# Patient Record
Sex: Female | Born: 1970 | Race: Black or African American | Hispanic: No | Marital: Single | Smoking: Never smoker
Health system: Southern US, Community
[De-identification: ages and names within clinical notes are randomized; demographics above are authoritative.]

## PROBLEM LIST (undated history)

## (undated) DIAGNOSIS — F319 Bipolar disorder, unspecified: Secondary | ICD-10-CM

## (undated) DIAGNOSIS — R531 Weakness: Secondary | ICD-10-CM

## (undated) DIAGNOSIS — A6000 Herpesviral infection of urogenital system, unspecified: Secondary | ICD-10-CM

## (undated) DIAGNOSIS — F32A Depression, unspecified: Secondary | ICD-10-CM

## (undated) DIAGNOSIS — E669 Obesity, unspecified: Secondary | ICD-10-CM

## (undated) DIAGNOSIS — F329 Major depressive disorder, single episode, unspecified: Secondary | ICD-10-CM

## (undated) HISTORY — DX: Weakness: R53.1

## (undated) HISTORY — PX: ANKLE FRACTURE SURGERY: SHX122

---

## 2005-12-29 HISTORY — PX: GASTRIC BYPASS: SHX52

## 2015-06-18 ENCOUNTER — Emergency Department
Admission: EM | Admit: 2015-06-18 | Discharge: 2015-06-19 | Disposition: A | Discharge status: Home / Self Care | Payer: Self-pay | Attending: Emergency Medicine | Admitting: Emergency Medicine

## 2015-06-18 ENCOUNTER — Encounter: Payer: Self-pay | Admitting: *Deleted

## 2015-06-18 DIAGNOSIS — F329 Major depressive disorder, single episode, unspecified: Secondary | ICD-10-CM | POA: Insufficient documentation

## 2015-06-18 DIAGNOSIS — R410 Disorientation, unspecified: Secondary | ICD-10-CM

## 2015-06-18 DIAGNOSIS — F10921 Alcohol use, unspecified with intoxication delirium: Secondary | ICD-10-CM

## 2015-06-18 DIAGNOSIS — Z3202 Encounter for pregnancy test, result negative: Secondary | ICD-10-CM | POA: Insufficient documentation

## 2015-06-18 DIAGNOSIS — F10121 Alcohol abuse with intoxication delirium: Secondary | ICD-10-CM | POA: Insufficient documentation

## 2015-06-18 DIAGNOSIS — F1010 Alcohol abuse, uncomplicated: Secondary | ICD-10-CM

## 2015-06-18 LAB — URINE DRUG SCREEN, QUALITATIVE (ARMC ONLY)
Amphetamines, Ur Screen: NOT DETECTED
Barbiturates, Ur Screen: NOT DETECTED
Benzodiazepine, Ur Scrn: NOT DETECTED
COCAINE METABOLITE, UR ~~LOC~~: NOT DETECTED
Cannabinoid 50 Ng, Ur ~~LOC~~: NOT DETECTED
MDMA (ECSTASY) UR SCREEN: NOT DETECTED
Methadone Scn, Ur: NOT DETECTED
Opiate, Ur Screen: NOT DETECTED
Phencyclidine (PCP) Ur S: NOT DETECTED
TRICYCLIC, UR SCREEN: NOT DETECTED

## 2015-06-18 LAB — URINALYSIS COMPLETE WITH MICROSCOPIC (ARMC ONLY)
BILIRUBIN URINE: NEGATIVE
Glucose, UA: NEGATIVE mg/dL
LEUKOCYTES UA: NEGATIVE
Nitrite: NEGATIVE
PH: 5 (ref 5.0–8.0)
PROTEIN: 30 mg/dL — AB
Specific Gravity, Urine: 1.025 (ref 1.005–1.030)

## 2015-06-18 LAB — COMPREHENSIVE METABOLIC PANEL
ALT: 27 U/L (ref 14–54)
ANION GAP: 12 (ref 5–15)
AST: 47 U/L — ABNORMAL HIGH (ref 15–41)
Albumin: 4.4 g/dL (ref 3.5–5.0)
Alkaline Phosphatase: 121 U/L (ref 38–126)
BILIRUBIN TOTAL: 1.4 mg/dL — AB (ref 0.3–1.2)
BUN: 13 mg/dL (ref 6–20)
CALCIUM: 9.3 mg/dL (ref 8.9–10.3)
CHLORIDE: 108 mmol/L (ref 101–111)
CO2: 19 mmol/L — ABNORMAL LOW (ref 22–32)
CREATININE: 1.2 mg/dL — AB (ref 0.44–1.00)
GFR, EST NON AFRICAN AMERICAN: 54 mL/min — AB (ref >60)
Glucose, Bld: 145 mg/dL — ABNORMAL HIGH (ref 65–99)
Potassium: 3.8 mmol/L (ref 3.5–5.1)
Sodium: 139 mmol/L (ref 135–145)
Total Protein: 8.5 g/dL — ABNORMAL HIGH (ref 6.5–8.1)

## 2015-06-18 LAB — CBC WITH DIFFERENTIAL/PLATELET
BASOS ABS: 0 10*3/uL (ref 0–0.1)
BASOS PCT: 1 %
Eosinophils Absolute: 0 10*3/uL (ref 0–0.7)
Eosinophils Relative: 0 %
HCT: 32.8 % — ABNORMAL LOW (ref 35.0–47.0)
Hemoglobin: 10.4 g/dL — ABNORMAL LOW (ref 12.0–16.0)
Lymphocytes Relative: 10 %
Lymphs Abs: 0.7 10*3/uL — ABNORMAL LOW (ref 1.0–3.6)
MCH: 27.4 pg (ref 26.0–34.0)
MCHC: 31.7 g/dL — AB (ref 32.0–36.0)
MCV: 86.4 fL (ref 80.0–100.0)
MONO ABS: 0.6 10*3/uL (ref 0.2–0.9)
Monocytes Relative: 9 %
NEUTROS PCT: 80 %
Neutro Abs: 5.3 10*3/uL (ref 1.4–6.5)
PLATELETS: 361 10*3/uL (ref 150–440)
RBC: 3.8 MIL/uL (ref 3.80–5.20)
RDW: 22.4 % — ABNORMAL HIGH (ref 11.5–14.5)
WBC: 6.6 10*3/uL (ref 3.6–11.0)

## 2015-06-18 LAB — ETHANOL

## 2015-06-18 LAB — POCT PREGNANCY, URINE: Preg Test, Ur: NEGATIVE

## 2015-06-18 NOTE — ED Notes (Signed)

## 2015-06-18 NOTE — ED Notes (Signed)
ENVIRONMENTAL ASSESSMENT Potentially harmful objects out of patient reach: No. Personal belongings secured: Yes.   Patient dressed in hospital provided attire only: Yes.   Plastic bags out of patient reach: Yes.   Patient care equipment (cords, cables, call bells, lines, and drains) shortened, removed, or accounted for: Yes.   Equipment and supplies removed from bottom of stretcher: Yes.   Potentially toxic materials out of patient reach: Yes.   Sharps container removed or out of patient reach: Yes.   

## 2015-06-18 NOTE — ED Notes (Signed)
ED BHU PLACEMENT JUSTIFICATION Is the patient under IVC or is there intent for IVC: Yes.   Is the patient medically cleared: Yes.   Is there vacancy in the ED BHU: Yes.   Is the population mix appropriate for patient: Yes.   Is the patient awaiting placement in inpatient or outpatient setting: Yes.   Has the patient had a psychiatric consult: No. Survey of unit performed for contraband, proper placement and condition of furniture, tampering with fixtures in bathroom, shower, and each patient room: Yes.  ; Findings:  APPEARANCE/BEHAVIOR calm, cooperative and adequate rapport can be established NEURO ASSESSMENT Orientation: time, place and person Hallucinations: No.None noted (Hallucinations) Speech: Normal Gait: normal RESPIRATORY ASSESSMENT Normal expansion.  Clear to auscultation.  No rales, rhonchi, or wheezing. CARDIOVASCULAR ASSESSMENT regular rate and rhythm, S1, S2 normal, no murmur, click, rub or gallop GASTROINTESTINAL ASSESSMENT soft, nontender, BS WNL, no r/g EXTREMITIES normal strength, tone, and muscle mass PLAN OF CARE Provide calm/safe environment. Vital signs assessed twice daily. ED BHU Assessment once each 12-hour shift. Collaborate with intake RN daily or as condition indicates. Assure the ED provider has rounded once each shift. Provide and encourage hygiene. Provide redirection as needed. Assess for escalating behavior; address immediately and inform ED provider.  Assess family dynamic and appropriateness for visitation as needed: Yes.  ; If necessary, describe findings:  Educate the patient/family about BHU procedures/visitation: Yes.  ; If necessary, describe findings:  

## 2015-06-18 NOTE — ED Notes (Signed)
Patient assigned to appropriate care area. Patient oriented to unit/care area: Informed that, for their safety, care areas are designed for safety and monitored by security cameras at all times; and visiting hours explained to patient. Patient verbalizes understanding, and verbal contract for safety obtained. 

## 2015-06-18 NOTE — ED Notes (Signed)

## 2015-06-18 NOTE — ED Notes (Signed)
Patient calm and cooperative. NAD noted at this time. Patient denies any complaints at this time. Patient verbalized understanding to contract for safety. Will continue to monitor.

## 2015-06-18 NOTE — ED Notes (Signed)
Patient sitting comfortably in chair, denies pain or other complaints at this time. Patient calm and cooperative, NAD noted. Patient verbalized contract for safety. Will continue to monitor.

## 2015-06-18 NOTE — BH Assessment (Signed)
Assessment Note  Debbie Golden is an 44 y.o. female. She reports to the ED after being picked up by EMS.  She states that she was walking to church with a cross on her back.  She states that she was dehydrated and someone must have seen her and contacted the police.  She states that she is not depressed or anxious.  She denied auditory or visual hallucinations.  She denied having homicidal or suicidal ideation or intent.  She denied using alcohol or drugs. IVC papers  reports that Debbie Golden  Was walking down the street carrying a homemade cross. It is reported that she parked her car and appeared to  Have just walked away. At this time her car is in a unknown area.  At the time she reported that she did not know where she was, where she was going, or where she has left her car before the episode.  She is reported by "Alinda Money", her boyfriend reports that Debbie Golden has been having trouble sleeping and doing unusual things. In addition he believed the stress of her new position may be impacting her.   Axis I: Bipolar, Manic Axis II: Deferred Axis III: History reviewed. No pertinent past medical history. Axis IV: other psychosocial or environmental problems and problems with primary support group Axis V: 31-40 impairment in reality testing  Past Medical History: History reviewed. No pertinent past medical history.  History reviewed. No pertinent past surgical history.  Family History: No family history on file.  Social History:  reports that she has never smoked. She does not have any smokeless tobacco history on file. She reports that she does not drink alcohol. Her drug history is not on file.  Additional Social History:  Alcohol / Drug Use History of alcohol / drug use?: No history of alcohol / drug abuse  CIWA: CIWA-Ar BP: (!) 143/92 mmHg Pulse Rate: (!) 101 COWS:    Allergies: No Known Allergies  Home Medications:  (Not in a hospital admission)  OB/GYN Status:  Patient's last menstrual period  was 06/11/2015.  General Assessment Data Location of Assessment: [facility] ED TTS Assessment: In system Is this a Tele or Face-to-Face Assessment?: Face-to-Face Is this an Initial Assessment or a Re-assessment for this encounter?: Initial Assessment Marital status: Single Maiden name: Mullens Is patient pregnant?: No Pregnancy Status: No Living Arrangements: Non-relatives/Friends (Boyfriend) Can pt return to current living arrangement?: Yes Admission Status: Involuntary Is patient capable of signing voluntary admission?: Yes Referral Source: MD Insurance type: Probation officer [employer] Screening Exam [facility] Walk-in ONLY) Medical Exam completed: Yes  Crisis Care Plan Living Arrangements: Non-relatives/Friends (Boyfriend) Name of Psychiatrist: None Name of Therapist: None  Education Status Is patient currently in school?: No Current Grade: n/a Highest grade of school patient has completed: Master's Degree Name of school: n/a Contact person: n/a  Risk to self with the past 6 months Suicidal Ideation: No Has patient been a risk to self within the past 6 months prior to admission? : No Suicidal Intent: No Has patient had any suicidal intent within the past 6 months prior to admission? : No Is patient at risk for suicide?: No Suicidal Plan?: No Has patient had any suicidal plan within the past 6 months prior to admission? : No Access to Means: No What has been your use of drugs/alcohol within the last 12 months?: None reported Previous Attempts/Gestures: No How many times?: 0 Other Self Harm Risks: None reported Triggers for Past Attempts: None known Intentional  Self Injurious Behavior: None Family Suicide History: No Recent stressful life event(s):  (None reported) Persecutory voices/beliefs?: No Depression: No Depression Symptoms:  (None ) Substance abuse history and/or treatment for substance abuse?: No Suicide prevention information given to non-admitted patients: Not  applicable  Risk to Others within the past 6 months Homicidal Ideation: No Does patient have any lifetime risk of violence toward others beyond the six months prior to admission? : No Thoughts of Harm to Others: No Current Homicidal Intent: No Current Homicidal Plan: No Access to Homicidal Means: No Identified Victim: None reported History of harm to others?: No Assessment of Violence: None Noted Violent Behavior Description: denied Does patient have access to weapons?: No Criminal Charges Pending?: No Does patient have a court date: No Is patient on probation?: No  Psychosis Hallucinations: None noted Delusions: None noted  Mental Status Report Appearance/Hygiene: In scrubs, Unremarkable Eye Contact: Good Motor Activity: Unremarkable Speech: Unremarkable Level of Consciousness: Alert Mood: Apprehensive Affect: Blunted Anxiety Level: None Thought Processes: Unable to Assess Judgement: Unable to Assess Orientation: Place, Time, Situation Obsessive Compulsive Thoughts/Behaviors: None  Cognitive Functioning Appetite: Good Sleep: Decreased (waking up at night)  ADLScreening [facility] Assessment Services) Patient's cognitive ability adequate to safely complete daily activities?: Yes Patient able to express need for assistance with ADLs?: Yes Independently performs ADLs?: Yes (appropriate for developmental age)  Prior Inpatient Therapy Prior Inpatient Therapy: No Prior Therapy Dates: n/a Prior Therapy Facilty/Provider(s): n/a Reason for Treatment: n/a  Prior Outpatient Therapy Prior Outpatient Therapy: Yes Prior Therapy Dates:  (Unsure- prior to relocation 6 months ago) Does patient have an ACCT team?: No Does patient have Intensive In-House Services?  : No Does patient have [facility] services? : No Does patient have P4CC services?: No  ADL Screening (condition at time of admission) Patient's cognitive ability adequate to safely complete daily activities?: Yes Patient  able to express need for assistance with ADLs?: Yes Independently performs ADLs?: Yes (appropriate for developmental age)       Abuse/Neglect Assessment (Assessment to be complete while patient is alone) Physical Abuse: Denies Verbal Abuse: Denies Sexual Abuse: Denies Exploitation of patient/patient's resources: Denies Self-Neglect: Denies Values / Beliefs Cultural Requests During Hospitalization: None Spiritual Requests During Hospitalization: None   Advance Directives (For Healthcare) Does patient have an advance directive?: No Would patient like information on creating an advanced directive?: Yes - Educational materials given    Additional Information 1:1 In Past 12 Months?: No CIRT Risk: No Elopement Risk: No Does patient have medical clearance?: No     Disposition:  Disposition Initial Assessment Completed for this Encounter: Yes Disposition of Patient:  (To be seen by the psychiatrist)  On Site Evaluation by:   Reviewed with Physician:    Theadora Rama 06/18/2015 11:19 PM

## 2015-06-18 NOTE — ED Notes (Signed)
Spoke with Georgann Housekeeper ([place]), with patient permission. Ms. Lanae Boast states patient was crawling on floor on Saturday and when asked why patient was crawling, patient replied "the dog was trying to tell me something." Per Ms. Lanae Boast patient was not acting normal all day Saturday. Ms. Lanae Boast reports patient went to see Ms. William Hamburger mother earlier today and had asked her how to get back to her boyfriend's house (this is where patient lives). They state they have been looking for her today unable to find her.

## 2015-06-18 NOTE — ED Notes (Signed)
Spoke with Debbie Golden, states vehicle was found by them at a garage. Patient made aware.

## 2015-06-18 NOTE — ED Notes (Signed)
Was walking on a street today carrying a cross, deneis SI,is having guilty thoughts about something

## 2015-06-18 NOTE — ED Notes (Signed)
MD at bedside. 

## 2015-06-18 NOTE — ED Notes (Signed)

## 2015-06-18 NOTE — ED Provider Notes (Addendum)
[facility] Emergency Department Provider Note     Time seen: ----------------------------------------- 6:51 PM on 06/18/2015 -----------------------------------------    I have reviewed the triage vital signs and the nursing notes.   HISTORY  Chief Complaint Manic Behavior    HPI Debbie Golden is a 44 y.o. female who presents ER having guilty thoughts, found walking on the street today caring a cross.Patient does not remember how long she's been walking, the reason that she didn't walking, or where she was walking too. Patient states she lives in [place] and spent the night at her aunt Sapphire's house. But again she does not room number time she developed after where she was going. States she's been doing a lot stress and having guilty thoughts. She states she made a cross out of sticks that she found along the way. She states she feels fine now just little dehydrated from walking in the sun. She denies suicidal or homicidal ideations   History reviewed. No pertinent past medical history.  There are no active problems to display for this patient.   History reviewed. No pertinent past surgical history.  Allergies Review of patient's allergies indicates no known allergies.  Social History History  Substance Use Topics  . Smoking status: Never Smoker   . Smokeless tobacco: Not on file  . Alcohol Use: No    Review of Systems Constitutional: Negative for fever. Eyes: Negative for visual changes. ENT: Negative for sore throat. Cardiovascular: Negative for chest pain. Respiratory: Negative for shortness of breath. Gastrointestinal: Negative for abdominal pain, vomiting and diarrhea. Genitourinary: Negative for dysuria. Musculoskeletal: Negative for back pain. Skin: Negative for rash. Neurological: Negative for headaches, focal weakness or numbness. Psychiatric: Patient does describe guilty and depressive feelings 10-point ROS otherwise  negative.  ____________________________________________   PHYSICAL EXAM:  VITAL SIGNS: ED Triage Vitals  Enc Vitals Group     BP 06/18/15 1828 145/93 mmHg     Pulse Rate 06/18/15 1828 105     Resp 06/18/15 1828 20     Temp 06/18/15 1828 98.9 F (37.2 C)     Temp Source 06/18/15 1828 Oral     SpO2 06/18/15 1828 99 %     Weight 06/18/15 1828 185 lb (83.915 kg)     Height 06/18/15 1828  (1.702 m)     Head Cir --      Peak Flow --      Pain Score --      Pain Loc --      Pain Edu? --      Excl. in GC? --    Mood and behavior are abnormal, Constitutional: Alert and oriented. Well appearing and in no distress. Eyes: Conjunctivae are normal. PERRL. Normal extraocular movements. ENT   Head: Normocephalic and atraumatic.   Nose: No congestion/rhinnorhea.   Mouth/Throat: Mucous membranes are moist.   Neck: No stridor. Hematological/Lymphatic/Immunilogical: No cervical lymphadenopathy. Cardiovascular: Normal rate, regular rhythm. Normal and symmetric distal pulses are present in all extremities. No murmurs, rubs, or gallops. Respiratory: Normal respiratory effort without tachypnea nor retractions. Breath sounds are clear and equal bilaterally. No wheezes/rales/rhonchi. Gastrointestinal: Soft and nontender. No distention. No abdominal bruits. There is no CVA tenderness. Musculoskeletal: Nontender with normal range of motion in all extremities. No joint effusions.  No lower extremity tenderness nor edema. Neurologic:  Normal speech and language. No gross focal neurologic deficits are appreciated. Speech is normal. No gait instability. Skin:  Skin is warm, dry and intact. No rash noted. Psychiatric:  Mood and behavior are abnormal, patient has very poor insight and is very vague with all of her answers. ____________________________________________  ED COURSE:  Pertinent labs & imaging results that were available during my care of the patient were reviewed by me and  considered in my medical decision making (see chart for details). Patient appears to be somewhat psychotic, will need psychiatric evaluation ____________________________________________    LABS (pertinent positives/negatives)  Labs Reviewed  CBC WITH DIFFERENTIAL/PLATELET - Abnormal; Notable for the following:    Hemoglobin 10.4 (*)    HCT 32.8 (*)    MCHC 31.7 (*)    RDW 22.4 (*)    Lymphs Abs 0.7 (*)    All other components within normal limits  COMPREHENSIVE METABOLIC PANEL - Abnormal; Notable for the following:    CO2 19 (*)    Glucose, Bld 145 (*)    Creatinine, Ser 1.20 (*)    Total Protein 8.5 (*)    AST 47 (*)    Total Bilirubin 1.4 (*)    GFR calc non Af Amer 54 (*)    All other components within normal limits  URINALYSIS COMPLETEWITH MICROSCOPIC ([facility] ONLY) - Abnormal; Notable for the following:    Color, Urine YELLOW (*)    APPearance HAZY (*)    Ketones, ur 2+ (*)    Hgb urine dipstick 2+ (*)    Protein, ur 30 (*)    Bacteria, UA RARE (*)    Squamous Epithelial / LPF 0-5 (*)    All other components within normal limits  ETHANOL  URINE DRUG SCREEN, QUALITATIVE ([facility] ONLY)  POC URINE PREG, ED  POCT PREGNANCY, URINE    RADIOLOGY  None  ____________________________________________  FINAL ASSESSMENT AND PLAN  Acute psychosis  Plan: Patient will need to be involuntarily committed until we can ascertain the reasons for her behavior. At this point I'm uncomfortable with her being discharged. We'll consult psychiatric services for evaluation   Emily Filbert, MD   Emily Filbert, MD 06/18/15 1857  Emily Filbert, MD 06/18/15 1942  Emily Filbert, MD 06/18/15 2242

## 2015-06-18 NOTE — ED Notes (Signed)
Debbie Golden requesting information on patient's car, patient unable to recall where she left her vehicle. Attempting to call [facility] to find patient car. Patient unsure where vehicle keys are. Informed Ms. Debbie Golden we attempting to find vehicle at this time.

## 2015-06-19 DIAGNOSIS — F329 Major depressive disorder, single episode, unspecified: Secondary | ICD-10-CM

## 2015-06-19 DIAGNOSIS — F1010 Alcohol abuse, uncomplicated: Secondary | ICD-10-CM

## 2015-06-19 DIAGNOSIS — R410 Disorientation, unspecified: Secondary | ICD-10-CM

## 2015-06-19 MED ORDER — ZIPRASIDONE MESYLATE 20 MG IM SOLR
20.0000 mg | Freq: Once | INTRAMUSCULAR | Status: AC
  Administered 2015-06-19: 20 mg via INTRAMUSCULAR

## 2015-06-19 MED ORDER — ZIPRASIDONE MESYLATE 20 MG IM SOLR
INTRAMUSCULAR | Status: AC
  Administered 2015-06-19: 20 mg via INTRAMUSCULAR
  Filled 2015-06-19: qty 20

## 2015-06-19 NOTE — ED Notes (Signed)
Lunch provided along with an extra drink   Appropriate to stimulation  No verbalized needs or concerns at this time  NAD assessed  Continue to monitor 

## 2015-06-19 NOTE — ED Notes (Signed)
BEHAVIORAL HEALTH ROUNDING Patient sleeping: Yes.   Patient alert and oriented: eyes closed  Appears asleep Behavior appropriate: Yes.  ; If no, describe:  Nutrition and fluids offered: Yes  Toileting and hygiene offered: sleeping Sitter present: q 15 minute observations and security camera monitoring Law enforcement present: yes  ODS 

## 2015-06-19 NOTE — ED Notes (Signed)
Consult completed - pt has had a visitor in the waiting room since approx 1300  Pt will be discharged to home pending IVC papers being rescinded

## 2015-06-19 NOTE — ED Notes (Signed)
Breakfast provided   Patient observed lying in bed with eyes closed  Even, unlabored respirations observed   NAD pt appears to be sleeping  I will continue to monitor along with every 15 minute visual observations and ongoing security camera monitoring    

## 2015-06-19 NOTE — ED Notes (Signed)
BEHAVIORAL HEALTH ROUNDING Patient sleeping: No. Patient alert and oriented: yes Behavior appropriate: No.; If no, describe: pt. Restless in room. Nutrition and fluids offered: Yes  Toileting and hygiene offered: Yes  Sitter present: no Law enforcement present: Yes

## 2015-06-19 NOTE — ED Notes (Signed)

## 2015-06-19 NOTE — ED Notes (Signed)
Pt. Came out of her room asking to go home.  Told pt. She was IVD

## 2015-06-19 NOTE — ED Notes (Signed)
Pt has been awake and ambulated to the BR   Pt then went to room 6 and sat in the chair for awhile  - observed pt go back to the BR and then to her bed and she laid down

## 2015-06-19 NOTE — ED Notes (Signed)
Pt to be discharged to home  1/1 bags of belongings returned to her and she verbalized that she received back everything that she came here with  Discharge instructions reviewed with her and she verbalized agreement and understanding

## 2015-06-19 NOTE — ED Notes (Signed)
Pt. Up again and trying to get into other pt. Rooms.  Talked to pt. And told pt. We are going to move her to another room. Pt. Ok with move.  Pt. Moved into 6, 7, and 8 section.

## 2015-06-19 NOTE — ED Notes (Signed)
BEHAVIORAL HEALTH ROUNDING Patient sleeping: No. Patient alert and oriented: yes Behavior appropriate: Yes.  ; If no, describe:  Nutrition and fluids offered: yes Toileting and hygiene offered: Yes  Sitter present: q15 minute observations and security camera monitoring Law enforcement present: Yes  ODS  

## 2015-06-19 NOTE — ED Notes (Signed)
Patient observed lying in bed with eyes closed  Even, unlabored respirations observed   NAD pt appears to be sleeping  I will continue to monitor along with every 15 minute visual observations and ongoing security camera monitoring    

## 2015-06-19 NOTE — ED Provider Notes (Signed)
-----------------------------------------   3:14 PM on 06/19/2015 -----------------------------------------   BP 136/74 mmHg  Pulse 112  Temp(Src) 98.3 F (36.8 C) (Oral)  Resp 20  Ht 5\' 7"  (1.702 m)  Wt 185 lb (83.915 kg)  BMI 28.97 kg/m2  SpO2 100%  LMP 06/11/2015  The patient had no acute events since last update.  Calm and cooperative at this time.  Seen by psychiatry and cleared for discharge. Patient to return to AA and her counselor in [place]. Clinically sober at this time without any signs of psychosis or delirium.    Myrna Blazer, MD 06/19/15 1515

## 2015-06-19 NOTE — ED Notes (Signed)
Pt. Out of room into hallway sitting on chair.

## 2015-06-19 NOTE — ED Notes (Signed)
Pt. Up and trying to get into other pt. Rooms.  Pt. Stated she was confused.  Pt. Was asked if she needed anything.  Pt. Stated "I don't need anything".  Pt. Redirected back to room.

## 2015-06-19 NOTE — ED Notes (Signed)
Pt with a phone call from Alinda Money [phone]

## 2015-06-19 NOTE — ED Notes (Signed)
BEHAVIORAL HEALTH ROUNDING  Patient sleeping: Yes.  Patient alert and oriented: no  Behavior appropriate: Yes. ; If no, describe:  Nutrition and fluids offered: No  Toileting and hygiene offered: No  Sitter present: no  Law enforcement present: Yes   

## 2015-06-19 NOTE — Consult Note (Signed)
[facility] Face-to-Face Psychiatry Consult   Reason for Consult:  Consult for this 44 year old woman who was picked up by law enforcement and appeared to be very confused yesterday. Referring Physician:  Joni Fears Patient Identification: Debbie Golden MRN:  [id number] Principal Diagnosis: Delirium, acute Diagnosis:   Patient Active Problem List   Diagnosis Date Noted  . Delirium, acute [R41.0] 06/19/2015  . Major depression [F32.2] 06/19/2015  . Alcohol abuse [F10.10] 06/19/2015    Total Time spent with patient: 1 hour  Subjective:   Debbie Golden is a 45 y.o. female patient admitted with "I've had a lot of stuff going on for a long time". The patient reports being aware that she was having a nervous breakdown but is now starting to feel better.Marland Kitchen  HPI:  Information from the patient and the chart. Labs reviewed. Commitment paperwork reviewed. Patient reports that she has been under a lot of stress recently. The biggest problem has been her job. She recently got a promotion and has been working long hours. Working weekends. She has not been sleeping well and she knows it. She has not been eating well and she knows it. She's been back to drinking and has escalated the amount up to 2 or 3 bottles of wine a day. She says that her fianc was trying to help her with her stress and had introduced her to a religious-based program. She took a little too far and was out walking in the hot sun yesterday carrying across. She admits that that was not something that they had recommended to her but something she thought up on her own. She knows that she was confused because she had not been eating or sleeping. Last night she finally got a decent night sleep and has eaten food and had appropriate fluid today. She says she is feeling better. She has been feeling anxious and dysphoric. Sleep pattern was poor. Denies that she had any suicidal thoughts. She says that when she was not eating and when she was drinking she had been  having occasional auditory hallucinations but they're not happening today. She had been off of her psychiatric medicine which was sertraline 100 mg a day.  Past psychiatric history: Patient says she had a suicide attempt 10 years ago. Was in the observation unit at [facility] for several days. No suicide attempts outside of that. She identifies drinking as being a major problem for her. [facility] and then went to an [place]. She tried to stay sober for a while but got back into drinking late last fall. No history of delirium tremens or seizures. Denies that she uses other drugs. Denies any history of being diagnosed with a psychotic disorder.  Social history: Patient is unmarried but has a fianc. She has her own apartment but she and the fianc stayed together most of the time. She has no children. She believes that she lost a miscarriage last week although it sounds like her memory of it is a little vague. She says she is estranged from most of her family and she is adopted. 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  Medical history: Denies any significant ongoing medical problems no history of high blood pressure or diabetes. Was on sertraline 100 mg a day not taking currently. No other prescription medicine.  Family history: Unknown because she is adopted HPI Elements:   Quality:  Confusion and agitation. Severity:  Moderate but potentially  life-threatening from heatstroke. Timing:  Escalating and happen mostly yesterday. Duration:  Seems to been transient for a few hours. Context:  Alcohol abuse dehydration poor nutrition high emotional stress.  Past Medical History: History reviewed. No pertinent past medical history. History reviewed. No pertinent past surgical history. Family History: No family history on file. Social History:  History  Alcohol Use No     History  Drug Use Not on file    History    Social History  . Marital Status: Single    Spouse Name: N/A  . Number of Children: N/A  . Years of Education: N/A   Social History Main Topics  . Smoking status: Never Smoker   . Smokeless tobacco: Not on file  . Alcohol Use: No  . Drug Use: Not on file  . Sexual Activity: Not on file   Other Topics Concern  . None   Social History Narrative  . None   Additional Social History:    History of alcohol / drug use?: No history of alcohol / drug abuse                     Allergies:  No Known Allergies  Labs:  Results for orders placed or performed during the hospital encounter of 06/18/15 (from the past 48 hour(s))  CBC WITH DIFFERENTIAL     Status: Abnormal   Collection Time: 06/18/15  6:23 PM  Result Value Ref Range   WBC 6.6 3.6 - 11.0 K/uL   RBC 3.80 3.80 - 5.20 MIL/uL   Hemoglobin 10.4 (L) 12.0 - 16.0 g/dL   HCT 32.8 (L) 35.0 - 47.0 %   MCV 86.4 80.0 - 100.0 fL   MCH 27.4 26.0 - 34.0 pg   MCHC 31.7 (L) 32.0 - 36.0 g/dL   RDW 22.4 (H) 11.5 - 14.5 %   Platelets 361 150 - 440 K/uL   Neutrophils Relative % 80 %   Neutro Abs 5.3 1.4 - 6.5 K/uL   Lymphocytes Relative 10 %   Lymphs Abs 0.7 (L) 1.0 - 3.6 K/uL   Monocytes Relative 9 %   Monocytes Absolute 0.6 0.2 - 0.9 K/uL   Eosinophils Relative 0 %   Eosinophils Absolute 0.0 0 - 0.7 K/uL   Basophils Relative 1 %   Basophils Absolute 0.0 0 - 0.1 K/uL  Comprehensive metabolic panel     Status: Abnormal   Collection Time: 06/18/15  6:23 PM  Result Value Ref Range   Sodium 139 135 - 145 mmol/L   Potassium 3.8 3.5 - 5.1 mmol/L   Chloride 108 101 - 111 mmol/L   CO2 19 (L) 22 - 32 mmol/L   Glucose, Bld 145 (H) 65 - 99 mg/dL   BUN 13 6 - 20 mg/dL   Creatinine, Ser 1.20 (H) 0.44 - 1.00 mg/dL   Calcium 9.3 8.9 - 10.3 mg/dL   Total Protein 8.5 (H) 6.5 - 8.1 g/dL   Albumin 4.4 3.5 - 5.0 g/dL   AST 47 (H) 15 - 41 U/L   ALT 27 14 - 54 U/L   Alkaline Phosphatase 121 38 - 126 U/L   Total Bilirubin 1.4 (H) 0.3 -  1.2 mg/dL   GFR calc non Af Amer 54 (L) >60 mL/min   GFR calc Af Amer >60 >60 mL/min    Comment: (NOTE) The eGFR has been calculated using the CKD EPI equation. This calculation has not been validated in all clinical situations. eGFR's persistently <60 mL/min signify  possible Chronic Kidney Disease.    Anion gap 12 5 - 15  Ethanol     Status: None   Collection Time: 06/18/15  6:23 PM  Result Value Ref Range   Alcohol, Ethyl (B) <5 <5 mg/dL    Comment:        LOWEST DETECTABLE LIMIT FOR SERUM ALCOHOL IS 5 mg/dL FOR MEDICAL PURPOSES ONLY   Urine Drug Screen, Qualitative ([facility] only)     Status: None   Collection Time: 06/18/15  6:23 PM  Result Value Ref Range   Tricyclic, Ur Screen NONE DETECTED NONE DETECTED   Amphetamines, Ur Screen NONE DETECTED NONE DETECTED   MDMA (Ecstasy)Ur Screen NONE DETECTED NONE DETECTED   Cocaine Metabolite,Ur [place] NONE DETECTED NONE DETECTED   Opiate, Ur Screen NONE DETECTED NONE DETECTED   Phencyclidine (PCP) Ur S NONE DETECTED NONE DETECTED   Cannabinoid 50 Ng, Ur  NONE DETECTED NONE DETECTED   Barbiturates, Ur Screen NONE DETECTED NONE DETECTED   Benzodiazepine, Ur Scrn NONE DETECTED NONE DETECTED   Methadone Scn, Ur NONE DETECTED NONE DETECTED    Comment: (NOTE) 675  Tricyclics, urine               Cutoff 1000 ng/mL 200  Amphetamines, urine             Cutoff 1000 ng/mL 300  MDMA (Ecstasy), urine           Cutoff 500 ng/mL 400  Cocaine Metabolite, urine       Cutoff 300 ng/mL 500  Opiate, urine                   Cutoff 300 ng/mL 600  Phencyclidine (PCP), urine      Cutoff 25 ng/mL 700  Cannabinoid, urine              Cutoff 50 ng/mL 800  Barbiturates, urine             Cutoff 200 ng/mL 900  Benzodiazepine, urine           Cutoff 200 ng/mL 1000 Methadone, urine                Cutoff 300 ng/mL 1100 1200 The urine drug screen provides only a preliminary, unconfirmed 1300 analytical test result and should not be used for non-medical 1400  purposes. Clinical consideration and professional judgment should 1500 be applied to any positive drug screen result due to possible 1600 interfering substances. A more specific alternate chemical method 1700 must be used in order to obtain a confirmed analytical result.  1800 Gas chromato graphy / mass spectrometry (GC/MS) is the preferred 1900 confirmatory method.   Urinalysis complete, with microscopic ([facility] only)     Status: Abnormal   Collection Time: 06/18/15  6:23 PM  Result Value Ref Range   Color, Urine YELLOW (A) YELLOW   APPearance HAZY (A) CLEAR   Glucose, UA NEGATIVE NEGATIVE mg/dL   Bilirubin Urine NEGATIVE NEGATIVE   Ketones, ur 2+ (A) NEGATIVE mg/dL   Specific Gravity, Urine 1.025 1.005 - 1.030   Hgb urine dipstick 2+ (A) NEGATIVE   pH 5.0 5.0 - 8.0   Protein, ur 30 (A) NEGATIVE mg/dL   Nitrite NEGATIVE NEGATIVE   Leukocytes, UA NEGATIVE NEGATIVE   RBC / HPF 0-5 0 - 5 RBC/hpf   WBC, UA 0-5 0 - 5 WBC/hpf   Bacteria, UA RARE (A) NONE SEEN   Squamous Epithelial / LPF 0-5 (A) NONE SEEN  Mucous PRESENT    Hyaline Casts, UA PRESENT   Pregnancy, urine POC     Status: None   Collection Time: 06/18/15  6:38 PM  Result Value Ref Range   Preg Test, Ur NEGATIVE NEGATIVE    Comment:        THE SENSITIVITY OF THIS METHODOLOGY IS >24 mIU/mL     Vitals: Blood pressure 136/74, pulse 112, temperature 98.3 F (36.8 C), temperature source Oral, resp. rate 20, height 5' 7"  (1.702 m), weight 83.915 kg (185 lb), last menstrual period 06/11/2015, SpO2 100 %.  Risk to Self: Suicidal Ideation: No Suicidal Intent: No Is patient at risk for suicide?: No Suicidal Plan?: No Access to Means: No What has been your use of drugs/alcohol within the last 12 months?: None reported How many times?: 0 Other Self Harm Risks: None reported Triggers for Past Attempts: None known Intentional Self Injurious Behavior: None Risk to Others: Homicidal Ideation: No Thoughts of Harm to Others:  No Current Homicidal Intent: No Current Homicidal Plan: No Access to Homicidal Means: No Identified Victim: None reported History of harm to others?: No Assessment of Violence: None Noted Violent Behavior Description: denied Does patient have access to weapons?: No Criminal Charges Pending?: No Does patient have a court date: No Prior Inpatient Therapy: Prior Inpatient Therapy: No Prior Therapy Dates: n/a Prior Therapy Facilty/Provider(s): n/a Reason for Treatment: n/a Prior Outpatient Therapy: Prior Outpatient Therapy: Yes Prior Therapy Dates:  (Unsure- prior to relocation 6 months ago) Does patient have an ACCT team?: No Does patient have Intensive In-House Services?  : No Does patient have [facility] services? : No Does patient have [facility] services?: No  No current facility-administered medications for this encounter.   Current Outpatient Prescriptions  Medication Sig Dispense Refill  . sertraline (ZOLOFT) 100 MG tablet Take 100 mg by mouth daily.      Musculoskeletal: Strength & Muscle Tone: within normal limits Gait & Station: normal Patient leans: N/A  Psychiatric Specialty Exam: Physical Exam  Constitutional: She appears well-developed and well-nourished.  HENT:  Head: Normocephalic and atraumatic.  Eyes: Conjunctivae are normal. Pupils are equal, round, and reactive to light.  Neck: Normal range of motion.  Cardiovascular: Normal heart sounds.   Respiratory: Effort normal.  GI: Soft.  Musculoskeletal: Normal range of motion.  Neurological: She is alert.  Skin: Skin is warm and dry.  Psychiatric: She has a normal mood and affect. Her speech is normal and behavior is normal. Judgment and thought content normal. Cognition and memory are normal.  Today the patient appears to have returned pretty much to her baseline. She is alert and oriented. Good insight. No suicidality no homicidality. No acute psychosis. Affect euthymic    Review of Systems  Constitutional:  Negative.   HENT: Negative.   Eyes: Negative.   Respiratory: Negative.   Cardiovascular: Negative.   Gastrointestinal: Negative.   Musculoskeletal: Negative.   Skin: Negative.   Neurological: Negative.   Psychiatric/Behavioral: The patient is nervous/anxious and has insomnia.     Blood pressure 136/74, pulse 112, temperature 98.3 F (36.8 C), temperature source Oral, resp. rate 20, height 5' 7"  (1.702 m), weight 83.915 kg (185 lb), last menstrual period 06/11/2015, SpO2 100 %.Body mass index is 28.97 kg/(m^2).  General Appearance: Casual  Eye Contact::  Good  Speech:  Slow  Volume:  Normal  Mood:  Euthymic  Affect:  Blunt  Thought Process:  Coherent  Orientation:  Full (Time, Place, and Person)  Thought Content:  Negative  Suicidal Thoughts:  No  Homicidal Thoughts:  No  Memory:  Immediate;   Good Recent;   Good Remote;   Fair  Judgement:  Fair  Insight:  Good  Psychomotor Activity:  Decreased  Concentration:  Fair  Recall:  [employer] of Knowledge:Fair  Language: Fair  Akathisia:  No  Handed:  Right  AIMS (if indicated):     Assets:  Communication Skills Desire for Improvement Physical Health Social Support Talents/Skills Vocational/Educational  ADL's:  Intact  Cognition: WNL  Sleep:      Medical Decision Making: Review of Psycho-Social Stressors (1), Review or order clinical lab tests (1), New Problem, with no additional work-up planned (3) and Review of Medication Regimen & Side Effects (2)  Treatment Plan Summary: Plan Patient was confused and probably delirious yesterday. She has recovered and at this point is not delirious or psychotic. Denies suicidality. No evidence of acute dangerousness. She has good insight and agrees to an appropriate treatment plan. Patient does not meet commitment criteria nor require inpatient hospital level treatment. Does not appear to be having dangerous symptoms of alcohol withdrawal.Patient has been counseled about the importance  of trying to get her life back on track and is very much in agreement to it. She agrees that she needs to start sleeping regularly, get rid of the alcohol, get back into treatment at [facility] and go back to seeing her counselor again. she plans to be mostly staying with her fianc. She will be taken off involuntary commitment. No prescriptions necessary.   Plan:  Patient does not meet criteria for psychiatric inpatient admission. Discussed crisis plan, support from social network, calling 911, coming to the Emergency Department, and calling Suicide Hotline. Disposition: Discharged to continue the IVC she can be discharged from the emergency room. Case will be discussed with the ER doctor. No prescriptions needed. Follow-up with private provider  Alethia Berthold 06/19/2015 1:38 PM

## 2015-06-19 NOTE — ED Notes (Signed)
BEHAVIORAL HEALTH ROUNDING Patient sleeping: No. Patient alert and oriented: no Behavior appropriate: No.; If no, describe: Pt. Up and down in a manic non destructive manner.  Pt. Stated that "marriage is off, I am not getting married.  Pt. Appears to be talking to someone in room. Nutrition and fluids offered: Yes  Toileting and hygiene offered: Yes  Sitter present: no Law enforcement present: Yes

## 2015-06-19 NOTE — ED Notes (Signed)

## 2015-06-19 NOTE — Discharge Instructions (Signed)
Alcohol Intoxication °Alcohol intoxication occurs when you drink enough alcohol that it affects your ability to function. It can be mild or very severe. Drinking a lot of alcohol in a short time is called binge drinking. This can be very harmful. Drinking alcohol can also be more dangerous if you are taking medicines or other drugs. Some of the effects caused by alcohol may include: °· Loss of coordination. °· Changes in mood and behavior. °· Unclear thinking. °· Trouble talking (slurred speech). °· Throwing up (vomiting). °· Confusion. °· Slowed breathing. °· Twitching and shaking (seizures). °· Loss of consciousness. °HOME CARE °· Do not drive after drinking alcohol. °· Drink enough water and fluids to keep your pee (urine) clear or pale yellow. Avoid caffeine. °· Only take medicine as told by your doctor. °GET HELP IF: °· You throw up (vomit) many times. °· You do not feel better after a few days. °· You frequently have alcohol intoxication. Your doctor can help decide if you should see a substance use treatment counselor. °GET HELP RIGHT AWAY IF: °· You become shaky when you stop drinking. °· You have twitching and shaking. °· You throw up blood. It may look bright red or like coffee grounds. °· You notice blood in your poop (bowel movements). °· You become lightheaded or pass out (faint). °MAKE SURE YOU:  °· Understand these instructions. °· Will watch your condition. °· Will get help right away if you are not doing well or get worse. °Document Released: 06/02/2008 Document Revised: 08/17/2013 Document Reviewed: 05/20/2013 °[employer]® Patient Information ©2015 [employer]. This information is not intended to replace advice given to you by your health care provider. Make sure you discuss any questions you have with your health care provider. ° °

## 2015-06-19 NOTE — ED Notes (Signed)
md Clapacs is consulting at this time  Pt observed with no unusual behavior  Appropriate to stimulation  No verbalized needs or concerns at this time  NAD assessed  Continue to monitor

## 2015-06-21 ENCOUNTER — Emergency Department
Admission: EM | Admit: 2015-06-21 | Discharge: 2015-06-22 | Disposition: A | Discharge status: Other Acute Inpatient Hospital | Payer: Self-pay | Attending: Emergency Medicine | Admitting: Emergency Medicine

## 2015-06-21 ENCOUNTER — Encounter: Payer: Self-pay | Admitting: Urgent Care

## 2015-06-21 DIAGNOSIS — F29 Unspecified psychosis not due to a substance or known physiological condition: Secondary | ICD-10-CM | POA: Insufficient documentation

## 2015-06-21 DIAGNOSIS — Z79899 Other long term (current) drug therapy: Secondary | ICD-10-CM | POA: Insufficient documentation

## 2015-06-21 DIAGNOSIS — F319 Bipolar disorder, unspecified: Secondary | ICD-10-CM

## 2015-06-21 DIAGNOSIS — F22 Delusional disorders: Secondary | ICD-10-CM | POA: Insufficient documentation

## 2015-06-21 DIAGNOSIS — R443 Hallucinations, unspecified: Secondary | ICD-10-CM

## 2015-06-21 DIAGNOSIS — F3164 Bipolar disorder, current episode mixed, severe, with psychotic features: Secondary | ICD-10-CM

## 2015-06-21 DIAGNOSIS — E876 Hypokalemia: Secondary | ICD-10-CM

## 2015-06-21 LAB — COMPREHENSIVE METABOLIC PANEL
ALT: 23 U/L (ref 14–54)
ANION GAP: 12 (ref 5–15)
AST: 32 U/L (ref 15–41)
Albumin: 3.9 g/dL (ref 3.5–5.0)
Alkaline Phosphatase: 91 U/L (ref 38–126)
BUN: 14 mg/dL (ref 6–20)
CHLORIDE: 108 mmol/L (ref 101–111)
CO2: 23 mmol/L (ref 22–32)
Calcium: 9.2 mg/dL (ref 8.9–10.3)
Creatinine, Ser: 0.7 mg/dL (ref 0.44–1.00)
GFR calc Af Amer: >60 mL/min (ref >60)
GFR calc non Af Amer: >60 mL/min (ref >60)
GLUCOSE: 98 mg/dL (ref 65–99)
Potassium: 3.2 mmol/L — ABNORMAL LOW (ref 3.5–5.1)
Sodium: 143 mmol/L (ref 135–145)
Total Bilirubin: 0.8 mg/dL (ref 0.3–1.2)
Total Protein: 7.6 g/dL (ref 6.5–8.1)

## 2015-06-21 LAB — ACETAMINOPHEN LEVEL: Acetaminophen (Tylenol), Serum: <10 ug/mL — ABNORMAL LOW (ref 10–30)

## 2015-06-21 LAB — CBC
HCT: 30.8 % — ABNORMAL LOW (ref 35.0–47.0)
Hemoglobin: 9.5 g/dL — ABNORMAL LOW (ref 12.0–16.0)
MCH: 26.7 pg (ref 26.0–34.0)
MCHC: 30.8 g/dL — ABNORMAL LOW (ref 32.0–36.0)
MCV: 86.6 fL (ref 80.0–100.0)
PLATELETS: 330 10*3/uL (ref 150–440)
RBC: 3.56 MIL/uL — ABNORMAL LOW (ref 3.80–5.20)
RDW: 22.6 % — ABNORMAL HIGH (ref 11.5–14.5)
WBC: 4.9 10*3/uL (ref 3.6–11.0)

## 2015-06-21 LAB — ETHANOL: Alcohol, Ethyl (B): <5 mg/dL (ref <5)

## 2015-06-21 LAB — SALICYLATE LEVEL

## 2015-06-21 MED ORDER — ZIPRASIDONE MESYLATE 20 MG IM SOLR
20.0000 mg | Freq: Once | INTRAMUSCULAR | Status: AC
  Administered 2015-06-21: 20 mg via INTRAMUSCULAR

## 2015-06-21 MED ORDER — POTASSIUM CHLORIDE CRYS ER 20 MEQ PO TBCR
20.0000 meq | EXTENDED_RELEASE_TABLET | Freq: Once | ORAL | Status: AC
  Administered 2015-06-21: 20 meq via ORAL
  Filled 2015-06-21: qty 1

## 2015-06-21 MED ORDER — QUETIAPINE FUMARATE 100 MG PO TABS
100.0000 mg | ORAL_TABLET | Freq: Every day | ORAL | Status: DC
  Administered 2015-06-21: 100 mg via ORAL
  Filled 2015-06-21: qty 1

## 2015-06-21 MED ORDER — ZIPRASIDONE MESYLATE 20 MG IM SOLR
INTRAMUSCULAR | Status: AC
  Administered 2015-06-21: 20 mg via INTRAMUSCULAR
  Filled 2015-06-21: qty 20

## 2015-06-21 MED ORDER — QUETIAPINE FUMARATE 200 MG PO TABS
ORAL_TABLET | ORAL | Status: AC
  Filled 2015-06-21: qty 1

## 2015-06-21 NOTE — Consult Note (Signed)
[facility] Face-to-Face Psychiatry Consult   Reason for Consult:  44 year old woman who was just here in the emergency room the day before yesterday. She was discharged and came back in a day. She tells me that she's been hallucinating. Referring Physician:  Lovena Le Patient Identification: Debbie Golden MRN:  [id number] Principal Diagnosis: Bipolar disorder (manic depression) Diagnosis:   Patient Active Problem List   Diagnosis Date Noted  . Bipolar disorder (manic depression) [F31.9] 06/21/2015  . Hypokalemia [E87.6] 06/21/2015  . Delirium, acute [R41.0] 06/19/2015  . Major depression [F32.2] 06/19/2015  . Alcohol abuse [F10.10] 06/19/2015    Total Time spent with patient: 1 hour  Subjective:   Debbie Golden is a 44 y.o. female patient admitted with "I've been hearing voices again".  HPI:  Information from the patient and the chart. Patient came back to the emergency room saying that she had been hearing voices again. Apparently she also had been walking around outside her house naked which had drawn the attention of neighbors. She hasn't slept since leaving here. Has been feeling confused. She has not taken any medication and says that she also has not had any alcohol or used any drugs. Patient was in the emergency room just a couple days ago with bizarre behavior which had seemed to clear up after she slept briefly and which at the time seemed to be related to alcohol abuse and stress. Putting together the whole story now it sounds more like typical bipolar disorder with a manic or mixed episode.  Past psychiatric history: Patient says that she had a hospitalization several years ago. Briefly at [facility]. Possible suicide attempt many years ago nothing more recently. Doesn't appear to have ever been told she had bipolar disorder.  Social history: Living alone. Works at a [employer]. Recent promotion has been very stressful. Has a fiance.  Medical history: No significant ongoing medical problems. Only  medication is sertraline 100 mg a day. Currently she has low potassium.  Substance abuse history: She had told us previously that she had been drinking heavily up to a bottle of wine a day. Has a past history of being in detox programs and living at halfway houses. HPI Elements:   Quality:  Depressed mood bizarre behavior hallucinations. Severity:  Severe with potentially life altering or threatening consequences. Timing:  Been going on for a few weeks getting worse. Duration:  Worse over the last couple days. Context:  Stress at work. Poor sleep..  Past Medical History: History reviewed. No pertinent past medical history. History reviewed. No pertinent past surgical history. Family History: No family history on file. Social History:  History  Alcohol Use No     History  Drug Use Not on file    History   Social History  . Marital Status: Single    Spouse Name: N/A  . Number of Children: N/A  . Years of Education: N/A   Social History Main Topics  . Smoking status: Never Smoker   . Smokeless tobacco: Not on file  . Alcohol Use: No  . Drug Use: Not on file  . Sexual Activity: Not on file   Other Topics Concern  . None   Social History Narrative   Additional Social History:                          Allergies:  No Known Allergies  Labs:  Results for orders placed or performed during the hospital encounter of 06/21/15 (from the  past 48 hour(s))  Acetaminophen level     Status: Abnormal   Collection Time: 06/21/15  4:53 AM  Result Value Ref Range   Acetaminophen (Tylenol), Serum <10 (L) 10 - 30 ug/mL    Comment:        THERAPEUTIC CONCENTRATIONS VARY SIGNIFICANTLY. A RANGE OF 10-30 ug/mL MAY BE AN EFFECTIVE CONCENTRATION FOR MANY PATIENTS. HOWEVER, SOME ARE BEST TREATED AT CONCENTRATIONS OUTSIDE THIS RANGE. ACETAMINOPHEN CONCENTRATIONS >150 ug/mL AT 4 HOURS AFTER INGESTION AND >50 ug/mL AT 12 HOURS AFTER INGESTION ARE OFTEN ASSOCIATED WITH  TOXIC REACTIONS.   CBC     Status: Abnormal   Collection Time: 06/21/15  4:53 AM  Result Value Ref Range   WBC 4.9 3.6 - 11.0 K/uL   RBC 3.56 (L) 3.80 - 5.20 MIL/uL   Hemoglobin 9.5 (L) 12.0 - 16.0 g/dL   HCT 30.8 (L) 35.0 - 47.0 %   MCV 86.6 80.0 - 100.0 fL   MCH 26.7 26.0 - 34.0 pg   MCHC 30.8 (L) 32.0 - 36.0 g/dL   RDW 22.6 (H) 11.5 - 14.5 %   Platelets 330 150 - 440 K/uL  Comprehensive metabolic panel     Status: Abnormal   Collection Time: 06/21/15  4:53 AM  Result Value Ref Range   Sodium 143 135 - 145 mmol/L   Potassium 3.2 (L) 3.5 - 5.1 mmol/L   Chloride 108 101 - 111 mmol/L   CO2 23 22 - 32 mmol/L   Glucose, Bld 98 65 - 99 mg/dL   BUN 14 6 - 20 mg/dL   Creatinine, Ser 0.70 0.44 - 1.00 mg/dL   Calcium 9.2 8.9 - 10.3 mg/dL   Total Protein 7.6 6.5 - 8.1 g/dL   Albumin 3.9 3.5 - 5.0 g/dL   AST 32 15 - 41 U/L   ALT 23 14 - 54 U/L   Alkaline Phosphatase 91 38 - 126 U/L   Total Bilirubin 0.8 0.3 - 1.2 mg/dL   GFR calc non Af Amer >60 >60 mL/min   GFR calc Af Amer >60 >60 mL/min    Comment: (NOTE) The eGFR has been calculated using the CKD EPI equation. This calculation has not been validated in all clinical situations. eGFR's persistently <60 mL/min signify possible Chronic Kidney Disease.    Anion gap 12 5 - 15  Ethanol (ETOH)     Status: None   Collection Time: 06/21/15  4:53 AM  Result Value Ref Range   Alcohol, Ethyl (B) <5 <5 mg/dL    Comment:        LOWEST DETECTABLE LIMIT FOR SERUM ALCOHOL IS 5 mg/dL FOR MEDICAL PURPOSES ONLY   Salicylate level     Status: None   Collection Time: 06/21/15  4:53 AM  Result Value Ref Range   Salicylate Lvl <0.4 2.8 - 30.0 mg/dL    Vitals: Blood pressure 137/81, pulse 97, temperature 98 F (36.7 C), temperature source Oral, resp. rate 16, height 5' 7"  (1.702 m), weight 83.915 kg (185 lb), last menstrual period 06/11/2015, SpO2 100 %.  Risk to Self: Is patient at risk for suicide?: No Risk to Others:   Prior Inpatient  Therapy:   Prior Outpatient Therapy:    Current Facility-Administered Medications  Medication Dose Route Frequency Provider Last Rate Last Dose  . potassium chloride SA (K-DUR,KLOR-CON) CR tablet 20 mEq  20 mEq Oral Once Gonzella Lex, MD      . QUEtiapine (SEROQUEL) tablet 100 mg  100 mg Oral QHS John  Salley Scarlet, MD       Current Outpatient Prescriptions  Medication Sig Dispense Refill  . sertraline (ZOLOFT) 100 MG tablet Take 100 mg by mouth daily.      Musculoskeletal: Strength & Muscle Tone: within normal limits Gait & Station: normal Patient leans: N/A  Psychiatric Specialty Exam: Physical Exam  Constitutional: She appears well-developed and well-nourished.  HENT:  Head: Normocephalic and atraumatic.  Eyes: Conjunctivae are normal. Pupils are equal, round, and reactive to light.  Neck: Normal range of motion.  Cardiovascular: Normal heart sounds.   Respiratory: Effort normal.  GI: Soft.  Musculoskeletal: Normal range of motion.  Neurological: She is alert.  Skin: Skin is warm and dry.  Psychiatric: Her mood appears anxious. Her affect is blunt and inappropriate. Her speech is delayed. She is agitated and actively hallucinating. Thought content is paranoid. Cognition and memory are impaired. She expresses inappropriate judgment. She exhibits abnormal recent memory and abnormal remote memory.  Currently the patient is appearing to be somewhat sedated and slow. Still very confused. Earlier today she was agitated and emotionally labile and clearly hallucinating.    Review of Systems  Constitutional: Negative.   HENT: Negative.   Eyes: Negative.   Respiratory: Negative.   Cardiovascular: Negative.   Gastrointestinal: Negative.   Musculoskeletal: Negative.   Skin: Negative.   Neurological: Negative.   Psychiatric/Behavioral: Positive for depression, hallucinations and memory loss. Negative for suicidal ideas and substance abuse. The patient is nervous/anxious and has  insomnia.     Blood pressure 137/81, pulse 97, temperature 98 F (36.7 C), temperature source Oral, resp. rate 16, height 5' 7"  (1.702 m), weight 83.915 kg (185 lb), last menstrual period 06/11/2015, SpO2 100 %.Body mass index is 28.97 kg/(m^2).  General Appearance: Disheveled and Guarded  Eye Contact::  Minimal  Speech:  Slow  Volume:  Decreased  Mood:  Anxious and Depressed  Affect:  Flat  Thought Process:  Tangential  Orientation:  Full (Time, Place, and Person)  Thought Content:  Hallucinations: Auditory  Suicidal Thoughts:  No  Homicidal Thoughts:  No  Memory:  Immediate;   Good Recent;   Fair Remote;   Fair  Judgement:  Impaired  Insight:  Shallow  Psychomotor Activity:  Decreased  Concentration:  Poor  Recall:  Poor  Fund of Knowledge:Fair  Language: Good  Akathisia:  No  Handed:  Right  AIMS (if indicated):     Assets:  Communication Skills Desire for Improvement Financial Resources/Insurance Physical Health Social Support  ADL's:  Intact  Cognition: Impaired,  Mild  Sleep:      Medical Decision Making: New problem, with additional work up planned, Review of Psycho-Social Stressors (1), Review or order clinical lab tests (1), Review of Medication Regimen & Side Effects (2) and Review of New Medication or Change in Dosage (2)  Treatment Plan Summary: Medication management and Plan At this point it looks like the problem is more then a transient one that it seemed to be 2 days ago. Most likely explanation would be bipolar disorder area still possible that it could be substance-induced. Patient has been continuing to have hallucinations however and bizarre behavior here in the emergency room. Alcohol level continues to be negative and it doesn't look like she has been drinking since she left her last. I doubt that this is delirium tremens. Patient will be admitted to the psychiatric ward. I'm starting her on Seroquel 100 mg at night. Discontinue the Zoloft. I'm going to  give her a dose  of potassium now and she can have it really evaluated in the next day. Labs will be ordered. Patient is advised of the plan and agrees.  Plan:  Recommend psychiatric Inpatient admission when medically cleared. Supportive therapy provided about ongoing stressors. Disposition: Admit to psychiatry. See orders.  John Clapacs 06/21/2015 4:50 PM

## 2015-06-21 NOTE — ED Notes (Signed)
Dr. Inocencio Homes updated on pts condition.

## 2015-06-21 NOTE — BHH Counselor (Signed)
Pt. is to be admitted to [facility] by Dr. Toni Amend. Attending Physician will be Dr. Mayford Knife.  Pt. has been assigned to room 311B, by [facility] Charge Nurse Juanna Cao., RN.  Intake Paper Work has been signed and placed on pt. Chart by TTS, Sallyanne Kuster ER staff Misty Stanley, ER Sect., Patient's Nurse, Vilinda Blanks., RN & Patient Access, Renee) have been made aware of the adm

## 2015-06-21 NOTE — ED Notes (Addendum)
Spoke to Boyfriend states she is from [place] came to [place] like 6-7 months ago he met her in Feb. Her took her purse and clothes with him. 2 pill bottle at the nursing station labeled. She has lived with him for the last 2 weeks because of change in behaviors. While he is at work his son around her during that time. She was brought her Monday for intoxication depression and delirium. He picked her up tues she was fine. Yesterday his neighbor found her naked on the front porch with the door open. She hears voices states talking to her self. She see babies. Im having a baby here. Im the queen [place]. Per boyfriend she hasnt drink in 8 days but was here Monday for it. She drinks a bottle of wine a day. She has her own place and car but lives with him for the last 2 weeks. Monday she drove her car got out left keys and car walked down [address] with stick cross. Evert Kohl is the boyfriend/ friend [phone].

## 2015-06-21 NOTE — ED Notes (Signed)
Pt. Noted sleeping in room. No complaints or concerns voiced. No distress or abnormal behavior noted. Will continue to monitor with security cameras. Q 15 minute rounds continue. 

## 2015-06-21 NOTE — ED Notes (Signed)
BEHAVIORAL HEALTH ROUNDING Patient sleeping: Yes.   Patient alert and oriented: not applicable Behavior appropriate: Yes.  ; If no, describe:  Nutrition and fluids offered: No Toileting and hygiene offered: No Sitter present: no Law enforcement present: Yes  

## 2015-06-21 NOTE — ED Notes (Signed)
BEHAVIORAL HEALTH ROUNDING Patient sleeping: No. Patient alert and oriented: alert Behavior appropriate: No.; If no, describe: active auditory and visual hallucination responding to internal stimuli   Nutrition and fluids offered: Yes  Toileting and hygiene offered: Yes  Sitter present: no Law enforcement present: Yes

## 2015-06-21 NOTE — ED Notes (Signed)
Pt calmer, states that she feels better when asked. Laying in bed. Consented to Vs being taken (see).

## 2015-06-21 NOTE — ED Notes (Signed)
Pt stated she does not have to use the bathroom. Pt said she would not give me a urine sample but b4 tech left the room pt stated she would when she has to use the bathroom again.

## 2015-06-21 NOTE — ED Notes (Signed)
Pt. Noted in room. No complaints or concerns voiced. No distress or abnormal behavior noted. Will continue to monitor with security cameras. Q 15 minute rounds continue. Sandwich and soft drink given. 

## 2015-06-21 NOTE — ED Notes (Signed)
Patient presents to the ED accompanied by friend. Friend reporting that patient is altered and "talking crazy". Patient reported to have been in her yard and in the streets naked. Patient with grandiose thinking pattern noted in triage - states, "I am Jehovah's queen. I am here to have my Lord's baby." Patient actually believes that she was brought to the hospital by her friend to give birth. Patient non-cooperative in triage - refusing blood draw and to dress out; actively attempting to leave at this time. Spoke with Dr. Manson Passey who asked for emergency IVC papers be initiated. Patient talking to people who are not present; (+) AH/VH noted as evidenced by the aforementioned and patient seeing people walking around with babies in the triage room. Of note, patient was here on 6/20 for ETOH intoxication with (+) delirium - was given Geodon 20mg  IM during this visit based off of paperwork that patient's friend has in hand.

## 2015-06-21 NOTE — ED Notes (Addendum)

## 2015-06-21 NOTE — ED Notes (Signed)
Pt resting in bed, did not eat breakfast. Door cracked and pt visible.

## 2015-06-21 NOTE — ED Provider Notes (Addendum)
[facility] Emergency Department Provider Note  ____________________________________________  Time seen: Approximately 4:15 AM  I have reviewed the triage vital signs and the nursing notes.   HISTORY  Chief Complaint Psychiatric Evaluation  2HPI Debbie Golden is a 44 y.o. female who was acutely psychotic in the emergency department. Patient constantly chants about things about died in [place]. Patient also states that she is to have a baby. And she constantly talks about things that we don't understand and is having extreme delusions and hallucinations. Family states the patient's been out of her medications for psychiatry for the past 3-4 weeks and had not talked to them about it at all.Patient does not appear to have any medical complaints. He cannot get any other significant history from the patient has a patient with will not cooperate.  Past Medical History  Diagnosis Date  . Depression   . Bipolar disorder     Patient Active Problem List   Diagnosis Date Noted  . GERD (gastroesophageal reflux disease) 06/25/2015  . Bipolar disorder, curr episode mixed, severe, with psychotic features 06/22/2015  . Bipolar disorder (manic depression) 06/21/2015  . Hypokalemia 06/21/2015  . Delirium, acute 06/19/2015  . Major depression 06/19/2015  . Alcohol abuse 06/19/2015    Past Surgical History  Procedure Laterality Date  . Gastric bypass N/A 2007  . Ankle fracture surgery Right     Current Outpatient Rx  Name  Route  Sig  Dispense  Refill  . clonazePAM (KLONOPIN) 0.5 MG tablet   Oral   Take 1 tablet (0.5 mg total) by mouth 2 (two) times daily.   60 tablet   0   . hydrOXYzine (ATARAX/VISTARIL) 50 MG tablet   Oral   Take 1 tablet (50 mg total) by mouth 3 (three) times daily as needed for anxiety.   90 tablet   0   . pantoprazole (PROTONIX) 40 MG tablet   Oral   Take 1 tablet (40 mg total) by mouth daily before breakfast.   30 tablet   0   .  QUEtiapine (SEROQUEL) 50 MG tablet   Oral   Take 3 tablets (150 mg total) by mouth at bedtime.   30 tablet   0   . traZODone (DESYREL) 50 MG tablet   Oral   Take 1 tablet (50 mg total) by mouth at bedtime as needed for sleep.   30 tablet   0     Allergies Review of patient's allergies indicates no known allergies.  Family History  Problem Relation Age of Onset  . Adopted: Yes    Social History History  Substance Use Topics  . Smoking status: Never Smoker   . Smokeless tobacco: Not on file  . Alcohol Use: Yes    Review of Systems Constitutional: No fever/chills Eyes: No visual changes. ENT: No sore throat. Cardiovascular: Denies chest pain. Respiratory: Denies shortness of breath. Gastrointestinal: No abdominal pain.  No nausea, no vomiting.  No diarrhea.  No constipation. Genitourinary: Negative for dysuria. Musculoskeletal: Negative for back pain. Skin: Negative for rash. Neurological: Negative for headaches, focal weakness or numbness. Psychiatric:  Patient is extremely altered with hallucinations and delusions.  Normal 10-point ROS otherwise negative.  ____________________________________________   PHYSICAL EXAM:  VITAL SIGNS: ED Triage Vitals  Enc Vitals Group     BP 06/21/15 0324 144/83 mmHg     Pulse Rate 06/21/15 0324 98     Resp 06/21/15 0324 18     Temp 06/21/15 0324 98.8 F (37.1  C)     Temp Source 06/21/15 0324 Oral     SpO2 06/21/15 0324 99 %     Weight 06/21/15 0324 185 lb (83.915 kg)     Height 06/21/15 0324  (1.702 m)     Head Cir --      Peak Flow --      Pain Score 06/21/15 0345 0     Pain Loc --      Pain Edu? --      Excl. in GC? --    Constitutional: Alert and oriented. Well appearing and in no acute distress. Eyes: Conjunctivae are normal. PERRL. EOMI. Head: Atraumatic. Patient tender to palpation over bilateral maxillary sinuses. Patient also has mild redness of her right TM. Nose: Positive congestion/rhinnorhea and  patient transport will be calling for further evaluation. Patient tender to palpation over her right arm as well.. Mouth/Throat: Mucous membranes are moist.  Oropharynx non-erythematous. Neck: No stridor. No cervical spine tenderness Hematological/Lymphatic/Immunilogical: Large cervical lymphadenopathy. Cardiovascular: Normal rate, regular rhythm. Grossly normal heart sounds.  Good peripheral circulation. Respiratory: Normal respiratory effort.  No retractions. Lungs CTAB. Gastrointestinal: Soft and nontender. No distention. No abdominal bruits. No CVA tenderness. Musculoskeletal: No lower extremity tenderness nor edema.  No joint effusions. Neurologic:  Normal speech and language. No gross focal neurologic deficits are appreciated. Speech is normal. No gait instability. Skin:  Skin is warm, dry and intact. No rash noted. Psychiatric: Mood and affect are normal. Speech and behavior are normal.  ____________________________________________   LABS (all labs ordered are listed, but only abnormal results are displayed)  Labs Reviewed  ACETAMINOPHEN LEVEL - Abnormal; Notable for the following:    Acetaminophen (Tylenol), Serum <10 (*)    All other components within normal limits  CBC - Abnormal; Notable for the following:    RBC 3.56 (*)    Hemoglobin 9.5 (*)    HCT 30.8 (*)    MCHC 30.8 (*)    RDW 22.6 (*)    All other components within normal limits  COMPREHENSIVE METABOLIC PANEL - Abnormal; Notable for the following:    Potassium 3.2 (*)    All other components within normal limits  ETHANOL  SALICYLATE LEVEL   ____________________________________________  EKG  None ____________________________________________  RADIOLOGY  None ____________________________________________   PROCEDURES  Procedure(s) performed: None  Critical Care performed: No  ____________________________________________   INITIAL IMPRESSION / ASSESSMENT AND PLAN / ED COURSE  Pertinent labs &  imaging results that were available during my care of the patient were reviewed by me and considered in my medical decision making (see chart for details). ----------------------------------------- 4:35 AM on 06/21/2015 -----------------------------------------  Patient kept getting up and down in the room and Outside of the Room to the [facility] Where We Just Went Ahead and Did IVC Papers and Gave the Patient a shot of Geodon to Help Her. ____________________________________________   FINAL CLINICAL IMPRESSION(S) / ED DIAGNOSES  Final diagnoses:  Psychosis, unspecified psychosis type  Hallucinations  Delusions      Leona Carry, MD 06/21/15 0535  Leona Carry, MD 06/26/15 [phone]

## 2015-06-21 NOTE — ED Notes (Signed)
BEHAVIORAL HEALTH ROUNDING Patient sleeping: No. Patient alert and oriented: no Behavior appropriate: Yes.  ; If no, describe:  Nutrition and fluids offered: Yes  Toileting and hygiene offered: Yes  Sitter present: no Law enforcement present: Yes  

## 2015-06-21 NOTE — ED Notes (Signed)
ENVIRONMENTAL ASSESSMENT Potentially harmful objects out of patient reach: No. Personal belongings secured: Yes.   Patient dressed in hospital provided attire only: Yes.   Plastic bags out of patient reach: No. Patient care equipment (cords, cables, call bells, lines, and drains) shortened, removed, or accounted for: No. Equipment and supplies removed from bottom of stretcher: No. Potentially toxic materials out of patient reach: Yes.   Sharps container removed or out of patient reach: No.

## 2015-06-21 NOTE — ED Notes (Signed)
BEHAVIORAL HEALTH ROUNDING Patient sleeping: No. Patient alert and oriented: no Behavior appropriate: Yes.  ; If no, describe: } Nutrition and fluids offered: Yes  Toileting and hygiene offered: Yes  Sitter present: no Law enforcement present: Yes 2

## 2015-06-21 NOTE — ED Notes (Signed)
Report received from William RN. Pt. Alert and oriented in no distress denies SI, HI, AVH and pain.  Pt. Instructed to come to me with problems or concerns.Will continue to monitor for safety via security cameras and Q 15 minute checks. 

## 2015-06-21 NOTE — ED Notes (Signed)
Transfer to [facility]  

## 2015-06-21 NOTE — ED Notes (Signed)
[phone] friend left number for patient to call.

## 2015-06-21 NOTE — ED Notes (Signed)
BEHAVIORAL HEALTH ROUNDING Patient sleeping: Yes.   Patient alert and oriented: yes Behavior appropriate: Yes.  ; If no, describe:  Nutrition and fluids offered: Yes  Toileting and hygiene offered: Yes  Sitter present: no Law enforcement present: Yes  

## 2015-06-21 NOTE — ED Notes (Signed)
Pt anxious, trying to open all the doors "searching for baby", talking to someone that I cannot see and saying "which door?, I can't find the baby", " tell me which door". Pt with increasing anxiety, becoming frantic. Trying to distract pt, talking to pt, sitting with pt had no affect. Dr Derrill Kay notified. geodon 20 mg im ordered.

## 2015-06-21 NOTE — ED Notes (Signed)
BEHAVIORAL HEALTH ROUNDING Patient sleeping: No. Patient alert and oriented: yes Behavior appropriate: Yes.  ; If no, describe:  Nutrition and fluids offered: Yes  Toileting and hygiene offered: Yes  Sitter present: no Law enforcement present: Yes  

## 2015-06-21 NOTE — ED Notes (Signed)
Pt sleeping in room, easily awakened for vital signs. Pt appropriate at this time. No abnormal conversation or gestures. Breakfast at bedside; pt aware and is back to resting at this time.

## 2015-06-22 ENCOUNTER — Inpatient Hospital Stay
Admission: EM | Admit: 2015-06-22 | Discharge: 2015-06-25 | DRG: 885 | Disposition: A | Discharge status: Home / Self Care | Payer: No Typology Code available for payment source | Attending: Psychiatry | Admitting: Psychiatry

## 2015-06-22 DIAGNOSIS — F3164 Bipolar disorder, current episode mixed, severe, with psychotic features: Secondary | ICD-10-CM | POA: Diagnosis not present

## 2015-06-22 DIAGNOSIS — Z9889 Other specified postprocedural states: Secondary | ICD-10-CM

## 2015-06-22 DIAGNOSIS — Z9884 Bariatric surgery status: Secondary | ICD-10-CM | POA: Diagnosis not present

## 2015-06-22 DIAGNOSIS — F319 Bipolar disorder, unspecified: Secondary | ICD-10-CM | POA: Diagnosis present

## 2015-06-22 DIAGNOSIS — Z915 Personal history of self-harm: Secondary | ICD-10-CM

## 2015-06-22 DIAGNOSIS — G4700 Insomnia, unspecified: Secondary | ICD-10-CM | POA: Diagnosis present

## 2015-06-22 DIAGNOSIS — E876 Hypokalemia: Secondary | ICD-10-CM | POA: Diagnosis present

## 2015-06-22 DIAGNOSIS — E538 Deficiency of other specified B group vitamins: Secondary | ICD-10-CM | POA: Diagnosis present

## 2015-06-22 DIAGNOSIS — F411 Generalized anxiety disorder: Secondary | ICD-10-CM | POA: Diagnosis present

## 2015-06-22 DIAGNOSIS — K219 Gastro-esophageal reflux disease without esophagitis: Secondary | ICD-10-CM | POA: Diagnosis present

## 2015-06-22 DIAGNOSIS — F1010 Alcohol abuse, uncomplicated: Secondary | ICD-10-CM | POA: Diagnosis present

## 2015-06-22 DIAGNOSIS — Z9114 Patient's other noncompliance with medication regimen: Secondary | ICD-10-CM

## 2015-06-22 HISTORY — DX: Major depressive disorder, single episode, unspecified: F32.9

## 2015-06-22 HISTORY — DX: Bipolar disorder, unspecified: F31.9

## 2015-06-22 HISTORY — DX: Depression, unspecified: F32.A

## 2015-06-22 LAB — URINE DRUG SCREEN, QUALITATIVE (ARMC ONLY)
AMPHETAMINES, UR SCREEN: NOT DETECTED
BENZODIAZEPINE, UR SCRN: NOT DETECTED
Barbiturates, Ur Screen: NOT DETECTED
Cannabinoid 50 Ng, Ur ~~LOC~~: NOT DETECTED
Cocaine Metabolite,Ur ~~LOC~~: NOT DETECTED
MDMA (ECSTASY) UR SCREEN: NOT DETECTED
Methadone Scn, Ur: NOT DETECTED
Opiate, Ur Screen: NOT DETECTED
Phencyclidine (PCP) Ur S: NOT DETECTED
TRICYCLIC, UR SCREEN: POSITIVE — AB

## 2015-06-22 LAB — URINALYSIS COMPLETE WITH MICROSCOPIC (ARMC ONLY)
Bilirubin Urine: NEGATIVE
Glucose, UA: NEGATIVE mg/dL
Hgb urine dipstick: NEGATIVE
LEUKOCYTES UA: NEGATIVE
Nitrite: NEGATIVE
PH: 5 (ref 5.0–8.0)
PROTEIN: 30 mg/dL — AB
RBC / HPF: NONE SEEN RBC/hpf (ref 0–5)
Specific Gravity, Urine: 1.029 (ref 1.005–1.030)

## 2015-06-22 LAB — LIPID PANEL
CHOLESTEROL: 170 mg/dL (ref 0–200)
HDL: 54 mg/dL (ref >40)
LDL Cholesterol: 97 mg/dL (ref 0–99)
Total CHOL/HDL Ratio: 3.1 RATIO
Triglycerides: 93 mg/dL (ref <150)
VLDL: 19 mg/dL (ref 0–40)

## 2015-06-22 LAB — BASIC METABOLIC PANEL
ANION GAP: 11 (ref 5–15)
BUN: 15 mg/dL (ref 6–20)
CHLORIDE: 110 mmol/L (ref 101–111)
CO2: 22 mmol/L (ref 22–32)
CREATININE: 0.71 mg/dL (ref 0.44–1.00)
Calcium: 9.1 mg/dL (ref 8.9–10.3)
GFR calc Af Amer: >60 mL/min (ref >60)
GFR calc non Af Amer: >60 mL/min (ref >60)
Glucose, Bld: 57 mg/dL — ABNORMAL LOW (ref 65–99)
Potassium: 3.7 mmol/L (ref 3.5–5.1)
Sodium: 143 mmol/L (ref 135–145)

## 2015-06-22 LAB — HEMOGLOBIN A1C: HEMOGLOBIN A1C: 5.1 % (ref 4.0–6.0)

## 2015-06-22 MED ORDER — QUETIAPINE FUMARATE 100 MG PO TABS
100.0000 mg | ORAL_TABLET | Freq: Every day | ORAL | Status: DC
  Administered 2015-06-22: 100 mg via ORAL
  Filled 2015-06-22: qty 1

## 2015-06-22 MED ORDER — MAGNESIUM HYDROXIDE 400 MG/5ML PO SUSP: 30.0000 mL | Freq: Every day | ORAL | Status: DC | PRN

## 2015-06-22 MED ORDER — TRAZODONE HCL 50 MG PO TABS: 50.0000 mg | ORAL_TABLET | Freq: Every evening | ORAL | Status: DC | PRN

## 2015-06-22 MED ORDER — POTASSIUM CHLORIDE CRYS ER 20 MEQ PO TBCR
20.0000 meq | EXTENDED_RELEASE_TABLET | Freq: Once | ORAL | Status: AC
  Administered 2015-06-22: 20 meq via ORAL
  Filled 2015-06-22: qty 1

## 2015-06-22 MED ORDER — ACETAMINOPHEN 325 MG PO TABS
650.0000 mg | ORAL_TABLET | Freq: Four times a day (QID) | ORAL | Status: DC | PRN
  Administered 2015-06-22 – 2015-06-23 (×2): 650 mg via ORAL
  Filled 2015-06-22 (×2): qty 2

## 2015-06-22 MED ORDER — CLONAZEPAM 0.5 MG PO TABS
0.5000 mg | ORAL_TABLET | Freq: Two times a day (BID) | ORAL | Status: DC
  Administered 2015-06-22 – 2015-06-25 (×7): 0.5 mg via ORAL
  Filled 2015-06-22 (×7): qty 1

## 2015-06-22 MED ORDER — ALUM & MAG HYDROXIDE-SIMETH 200-200-20 MG/5ML PO SUSP
30.0000 mL | ORAL | Status: DC | PRN
Start: 2015-06-22 — End: 2015-06-25

## 2015-06-22 MED ORDER — HYDROXYZINE HCL 50 MG PO TABS: 50.0000 mg | ORAL_TABLET | Freq: Three times a day (TID) | ORAL | Status: DC | PRN

## 2015-06-22 NOTE — [facility] Suicide Risk Assessment (Signed)
[facility] Admission Suicide Risk Assessment   Nursing information obtained from:    Demographic factors:    Current Mental Status:    Loss Factors:    Historical Factors:    Risk Reduction Factors:    Total Time spent with patient: 45 minutes Principal Problem: <principal problem not specified> Diagnosis:   Patient Active Problem List   Diagnosis Date Noted  . Bipolar disorder, curr episode mixed, severe, with psychotic features [F31.64] 06/22/2015  . Bipolar disorder (manic depression) [F31.9] 06/21/2015  . Hypokalemia [E87.6] 06/21/2015  . Delirium, acute [R41.0] 06/19/2015  . Major depression [F32.2] 06/19/2015  . Alcohol abuse [F10.10] 06/19/2015     Continued Clinical Symptoms:  Alcohol Use Disorder Identification Test Final Score (AUDIT): 0 The "Alcohol Use Disorders Identification Test", Guidelines for Use in Primary Care, Second Edition.  World Science writer [facility]). Score between 0-7:  no or low risk or alcohol related problems. Score between 8-15:  moderate risk of alcohol related problems. Score between 16-19:  high risk of alcohol related problems. Score 20 or above:  warrants further diagnostic evaluation for alcohol dependence and treatment.   CLINICAL FACTORS:   Bipolar Disorder:   Mixed State Depression:   Impulsivity Insomnia   Musculoskeletal: Strength & Muscle Tone: within normal limits Gait & Station: normal Patient leans: N/A  Psychiatric Specialty Exam: Physical Exam  ROS  Blood pressure 130/77, pulse 100, temperature 98 F (36.7 C), temperature source Oral, resp. rate 18, height 5\' 7"  (1.702 m), weight 83.008 kg (183 lb), last menstrual period 06/11/2015, SpO2 100 %.Body mass index is 28.66 kg/(m^2).  See H and P                                                       COGNITIVE FEATURES THAT CONTRIBUTE TO RISK:  None    SUICIDE RISK:   Minimal: No identifiable suicidal ideation.  Patients presenting with no risk  factors but with morbid ruminations; may be classified as minimal risk based on the severity of the depressive symptoms  PLAN OF CARE: Will address patient's mood instability with mood stabilizers. We'll address her anxiety with benzodiazepines. The patient denying suicidal ideation at this time. Patient has protective factors of gender, employment, race. She has risk factors of a prior suicide attempt in 2005. She describes some social supports which are protective.  Medical Decision Making:  Established Problem, Worsening (2)  I certify that inpatient services furnished can reasonably be expected to improve the patient's condition.   Wallace Going 06/22/2015, 9:56 AM

## 2015-06-22 NOTE — [facility] Group Notes (Signed)
[facility] Group Notes:  (Nursing/MHT/Case Management/Adjunct)  Date:  06/22/2015  Time:  11:16 PM  Type of Therapy:  Group Therapy  Participation Level:  Did Not Attend   Leandro Berkowitz Joy Lismary Kiehn 06/22/2015, 11:16 PM

## 2015-06-22 NOTE — Progress Notes (Signed)
Recreation Therapy Notes  INPATIENT RECREATION THERAPY ASSESSMENT  Patient Details Name: Debbie Golden MRN: [id number] DOB: 12-13-1971 Today's Date: 06/22/2015  Patient Stressors: Work, Other (Comment) (Learning to adjust to new changes)  Coping Skills:   Isolate, Substance Abuse, Avoidance, Exercise, Art/Dance, Music, Sports, Other (Comment) (Work)  Personal Challenges: Anger, Communication, Concentration, Decision-Making, Expressing Yourself, Problem-Solving, Relationships, Self-Esteem/Confidence, Social Interaction, Stress Management, Substance Abuse, Trusting Others, Work International aid/development worker  Leisure Interests (2+):  Individual - Other (Comment) Debbie Golden, be around family)  Awareness of Community Resources:  Yes  Community Resources:  Park, Other (Comment) (Track)  Current Use: No  If no, Barriers?: Other (Comment) (Time)  Patient Strengths:  Caring and strong  Patient Identified Areas of Improvement:  Everything. "I want to learn to live without stress and all the other things I struggle with."  Current Recreation Participation:  Cooking for family, watcing sports with family  Patient Goal for Hospitalization:  To get medications under control, to get rested and get body back in shape to feel better  [place] of Residence:  [place] of Residence:  [place]   Current [place] (including self-harm):  No  Current HI:  No  Consent to Intern Participation: N/A   Jacquelynn Cree, LRT/CTRS 06/22/2015, 2:42 PM

## 2015-06-22 NOTE — B[facility] Group Notes (Signed)
[facility] Group Notes:  (Nursing/MHT/Case Management/Adjunct)  Date:  06/22/2015  Time:  11:39 AM  Type of Therapy:  Group Therapy  Participation Level:  Did Not Attend  Summary of Progress/Problems:  Thang Flett De'Chelle Robertson Colclough 06/22/2015, 11:39 AM

## 2015-06-22 NOTE — Progress Notes (Signed)
Patient was in room at the beginning of this shift. Alert and oriented X 4 and denying suicidal/homicidal thoughts. Became anxious and restless later but reported "I am OK, I am all right". Received her medications voluntarily then went to the dayroom and emotional support offered. Patient had a snack and was seen interacting appropriately with peers. Currently in bed resting, staff monitoring. No sign of distress.

## 2015-06-22 NOTE — Progress Notes (Signed)
Patient stayed in room most of the time.Little hesitant to go in groups.with encouragement went to dayroom for meals.Denies suicidal and homicidal ideation.Stated that she is hearing voices,the next moment she said "I am not."Stated that she is anxious because she cannot go home.She is guarded.Compliant with meds.

## 2015-06-22 NOTE — Progress Notes (Signed)
Patient arrived from the ED in a wheelchair. She was unable to remember if she could walk and was prompted and assisted out of the chair. She later discovered she could walk. She was cooperative during the skin assessment and taking of her vital signs. 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 She noted she was not hungry or in any pain. She was shown her room and monitored for safety while she got into bed. She noted no needs or concerns. She denies SI, HI, and AVH at present time.
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
## 2015-06-22 NOTE — Plan of Care (Signed)
Problem: Ineffective individual coping Goal: LTG: Patient will report a decrease in negative feelings Outcome: Progressing Patient denies suicidal ideation.

## 2015-06-22 NOTE — Progress Notes (Signed)
Patient skin was assessed. No breakdown or injuries. No contraband noted.

## 2015-06-22 NOTE — ED Notes (Signed)
Pt. Noted in room. No complaints or concerns voiced. No distress or abnormal behavior noted. Will continue to monitor with security cameras. Q 15 minute rounds continue. 

## 2015-06-22 NOTE — [facility] Group Notes (Signed)
[facility] LCSW Group Therapy  06/22/2015 3:47 PM  Type of Therapy:  Group Therapy  Participation Level:  Did Not Attend  Summary of Progress/Problems:Feelings around relapse and recovery: Pt will discuss emotions they experience before and after a relapse. Pt will be encouraged to explore feelings around recovery.    Rondall Allegra, MSW, LCSWA 06/22/2015, 3:47 PM

## 2015-06-22 NOTE — [facility] Group Notes (Signed)
[facility] LCSW Aftercare Discharge Planning Group Note  06/22/2015 10:42 AM  Participation Quality:  Patient did not attend group  Affect:  n/a  Cognitive:  n/a  Insight:  n/a  Engagement in Group:  n/a  Modes of Intervention:  n/a  Summary of Progress/Problems:  Beryl Meager T 06/22/2015, 10:42 AM

## 2015-06-22 NOTE — Plan of Care (Signed)
Problem: Ineffective individual coping Goal: STG: Patient will remain free from self harm Outcome: Progressing No self-harm.      

## 2015-06-22 NOTE — Progress Notes (Signed)
INITIAL NUTRITION ASSESSMENT   INTERVENTION:  Meals and Snacks: encourage menu completion to best meet pt preferences Medical Food Supplement Therapy: will send [employer] Instant Breakfast daily for added nutrition  NUTRITION DIAGNOSIS:  Inadequate oral intake related to acute illness as evidenced by meal completion <25%   GOAL:  Patient will meet greater than or equal to 90% of their needs  MONITOR:  Energy Intake, Anthropometrics   REASON FOR ASSESSMENT:  Malnutrition Screening Tool  ASSESSMENT:  Debbie Golden is a 44 y.o. female with Bipolar disorder, lack of sleep for the past 3-4 days, denies suicidal thoughts.  Past Medical History  Diagnosis Date  . Depression   . Bipolar disorder     Diet Order: Regular  Current Nutrition: Recorded po intake 0% of meals   Medications: KCl given  Protein Profile:  Recent Labs Lab 06/18/15 1823 06/21/15 0453  ALBUMIN 4.4 3.9     Glucose and Electrolyte/Renal profile:   Recent Labs Lab 06/18/15 1823 06/21/15 0453 06/22/15 0642  NA 139 143 143  K 3.8 3.2* 3.7  CL 108 108 110  CO2 19* 23 22  BUN 13 14 15   CREATININE 1.20* 0.70 0.71  CALCIUM 9.3 9.2 9.1  GLUCOSE 145* 98 57*    Anthropometrics:   Body mass index is 28.66 kg/(m^2).   Nutrition-Focused Physical Exam Findings:  Unable to complete Nutrition-Focused physical exam at this time.   Filed Weights   06/22/15 0115  Weight: 183 lb (83.008 kg)   Per MST pt does not know weight trend PTA.  Estimated Energy Needs:  Estimated Energy Needs: (25-30kcals/kg) [id number]  Estimated Protein Needs: (0.8-1.0g/kg) 66-83g protein  Estimated Fluid Needs: (25-66mL/kg) 2075-2485mL of fluid   MODERATE Care Level   Leda Quail, RD, LDN Pager [phone]

## 2015-06-22 NOTE — Plan of Care (Signed)
Problem: [facility] Participation in Recreation Therapeutic Interventions Goal: STG-Patient will demonstrate improved self esteem by identif STG: Self-Esteem - Within 5 treatment sessions, patient will verbalize at least 5 positive affirmation statements in each of 3 treatment sessions to increase self-esteem post d/c.  Outcome: Progressing Treatment Session 1; Completed 1 out of 3: At approximately 4:25 pm, LRT met with patient in patient room. Patient verbalized 5 positive affirmation statements. Patient reported it felt "olay". LRT encouraged patient to continue saying positive affirmation statements.  Leonette Monarch, LRT/CTRS 06.24.16 5:27 pm Goal: STG-Patient will identify at least five coping skills for ** STG: Coping Skills - Within 4 treatment sessions, patient will verbalize at least 5 coping skills for substance abuse in each of 2 treatment sessions to decrease substance abuse post d/c.  Outcome: Progressing Treatment Session 1; Completed 1 out of 2: At approximately 4:25 pm, LRT met with patient in patient room. Patient verbalized 5 coping skills for substance abuse. LRT educated patient on leisure and why it is important to implement into her schedule. LRT provided patient with blank schedules to help her plan her day and try to avoid using substances. LRT educated patient on healthy support systems.   Leonette Monarch, LRT/CTRS 06.24.16 5:30 pm Goal: STG-Other Recreation Therapy Goal (Specify) STG: Stress Management - Within 5 treatment sessions, patient will demonstrate at least one stress management technique in each of 2 treatment sessions to increase stress management skills post d/c.  Outcome: Progressing Treatment Session 1; Completed 0 out of 2; At approximately 4:25 pm, LRT met with patient in patient room. LRT educated and provided patient with handouts on stress management techniques. Patient verbalized understanding. LRT encouraged patient to read over and practice the stress  management techniques.  Leonette Monarch, LRT/CTRS 06.24.16 5:31 pm

## 2015-06-22 NOTE — Tx Team (Addendum)
Initial Interdisciplinary Treatment Plan   PATIENT STRESSORS: Medication change or noncompliance   PATIENT STRENGTHS: Supportive family/friends  Communication skills Education   PROBLEM LIST: Problem List/Patient Goals Date to be addressed Date deferred Reason deferred Estimated date of resolution  Bipolar disorder                                                       DISCHARGE CRITERIA:  Ability to meet basic life and health needs Improved stabilization in mood, thinking, and/or behavior Motivation to continue treatment in a less acute level of care Need for constant or close observation no longer present  PRELIMINARY DISCHARGE PLAN: Outpatient therapy  PATIENT/FAMIILY INVOLVEMENT: This treatment plan has been presented to and reviewed with the patient, Debbie Golden, and/or family member.  The patient and family have been given the opportunity to ask questions and make suggestions.  Melisa G Miller 06/22/2015, 4:11 AM

## 2015-06-22 NOTE — Progress Notes (Signed)
Recreation Therapy Notes  Date: 06.24.16 Time: 3:00 pm Location: Craft Room  Group Topic: Problem Solving, Communication, Teamwork  Goal Area(s) Addresses:  Patient will work in teams towards shared goal. Patient will verbalize skills needed to make activity successful. Patient will verbalize benefit of using skills identified to reach post d/c goals.  Behavioral Response: Attentive, Left Early  Intervention: Landing Pad  Activity: Patients were given 15 straws and approximately 2.5 feet of tape and instructed to build a landing pad to catch a golf ball.  Education: LRT educated patients on why communication, teamwork, and problem solving are important.  Education Outcome: Acknowledges education/In group clarification offered   Clinical Observations/Feedback: Patient worked with team to build landing pad. Patient left group at approximately 3:24 pm to use the bathroom. Patient did not return to group.  Jacquelynn Cree, LRT/CTRS 06/22/2015 5:10 PM

## 2015-06-23 LAB — TSH: TSH: 1.063 u[IU]/mL (ref 0.350–4.500)

## 2015-06-23 LAB — VITAMIN B12: VITAMIN B 12: 397 pg/mL (ref 180–914)

## 2015-06-23 LAB — POTASSIUM: POTASSIUM: 3.4 mmol/L — AB (ref 3.5–5.1)

## 2015-06-23 MED ORDER — PANTOPRAZOLE SODIUM 40 MG PO TBEC
40.0000 mg | DELAYED_RELEASE_TABLET | Freq: Every day | ORAL | Status: DC
  Administered 2015-06-23 – 2015-06-25 (×3): 40 mg via ORAL
  Filled 2015-06-23 (×3): qty 1

## 2015-06-23 MED ORDER — QUETIAPINE FUMARATE 25 MG PO TABS
125.0000 mg | ORAL_TABLET | Freq: Every day | ORAL | Status: DC
  Administered 2015-06-23: 125 mg via ORAL
  Filled 2015-06-23: qty 1

## 2015-06-23 NOTE — Plan of Care (Signed)
Problem: Ineffective individual coping Goal: LTG: Patient will report a decrease in negative feelings Outcome: Progressing Patient cooperative, denying SI/HI. More engaged

## 2015-06-23 NOTE — [facility] Group Notes (Signed)
[facility] Group Notes:  (Nursing/MHT/Case Management/Adjunct)  Date:  06/23/2015  Time:  2:23 PM  Type of Therapy:  Group Therapy  Participation Level:  Active  Participation Quality:  Appropriate, Attentive, Sharing and Supportive  Affect:  Appropriate  Cognitive:  Alert, Appropriate and Oriented  Insight:  Appropriate  Engagement in Group:  Engaged  Modes of Intervention:  Problem-solving  Summary of Progress/Problems:  Debbie Golden 06/23/2015, 2:23 PM

## 2015-06-23 NOTE — [facility] Group Notes (Signed)
[facility] LCSW Group Therapy  06/23/2015 3:15 PM  Type of Therapy:  Group Therapy  Participation Level:  Minimal  Participation Quality:  Attentive  Affect:  Appropriate  Cognitive:  Alert  Insight:  Improving  Engagement in Therapy:  Limited  Modes of Intervention:  Discussion, Education, Socialization and Support  Summary of Progress/Problems:Pt will identify unhealthy thoughts and how they impact their emotions and behavior. Pt will be encouraged to discuss these thoughts, emotions and behaviors with the group. Pt will be asked to pick a positive affirmation and discuss what emotion they experienced.  Debbie Golden read an affirmation about accepting change. She was able to relate because she is experiencing a lot of change in her life.   Debbie Golden L Taz Vanness 06/23/2015, 3:15 PM
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
## 2015-06-23 NOTE — Plan of Care (Signed)
Problem: Ineffective individual coping Goal: STG:Pt. will utilize relaxation techniques to reduce stress STG: Patient will utilize relaxation techniques to reduce stress levels  Outcome: Progressing Patient able to sit in day room  doing activites

## 2015-06-23 NOTE — Progress Notes (Signed)
Patient with sad affect and cooperative behavior with meals, meds and plan of care. No SI/HI/AVH at this time. Good adls and good appetite. No distress, no discomfort. Encouraged to attend therapy groups to learn and initiate coping skills. Safety maintained.
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
## 2015-06-23 NOTE — Progress Notes (Signed)
Patient ID: Debbie Golden, female   DOB: 1971/12/06, 44 y.o.   MRN: [id number] SW attempted calling Pt's boyfriend Retia Passe to get more information [phone].  He was avoidant of discussing anything about Pt's condition or his concerns saying he did not want to talk about anything until after he was able to speak with the patient. He was planning to visit this evening.  Glennon Mac, MSW LCSW 06/23/2015

## 2015-06-24 MED ORDER — QUETIAPINE FUMARATE 25 MG PO TABS
150.0000 mg | ORAL_TABLET | Freq: Every day | ORAL | Status: DC
  Administered 2015-06-24: 150 mg via ORAL
  Filled 2015-06-24: qty 2

## 2015-06-24 NOTE — Progress Notes (Addendum)
[facility] MD Progress Note  06/24/2015 1:13 PM Lakiah Dhingra  MRN:  [id number] Subjective:  Patient was in a brighter mood today. She states she feels she has accepted her current situation and condition. I did discuss with her that perhaps she should continue to increase her Seroquel for more mood stability. She was agreeable with this plan. We did discuss that her blood pressure was a little low today. However it does appear they checked orthostatics and that was consistent with some dehydration. Patient has been encouraged to increase her by mouth intake of liquids. She stated she thought she had but she was using coffee. I explained the coffee was a diuretic and was not really the best for hydration. Principal Problem: <principal problem not specified> Diagnosis:   Patient Active Problem List   Diagnosis Date Noted  . Bipolar disorder, curr episode mixed, severe, with psychotic features [F31.64] 06/22/2015  . Bipolar disorder (manic depression) [F31.9] 06/21/2015  . Hypokalemia [E87.6] 06/21/2015  . Delirium, acute [R41.0] 06/19/2015  . Major depression [F32.2] 06/19/2015  . Alcohol abuse [F10.10] 06/19/2015   Total Time spent with patient: 15 minutes   Past Medical History:  Past Medical History  Diagnosis Date  . Depression   . Bipolar disorder     Past Surgical History  Procedure Laterality Date  . Gastric bypass N/A 2007  . Ankle fracture surgery Right    Family History:  Family History  Problem Relation Age of Onset  . Adopted: Yes   Social History:  History  Alcohol Use  . Yes     History  Drug Use No    History   Social History  . Marital Status: Single    Spouse Name: N/A  . Number of Children: N/A  . Years of Education: N/A   Social History Main Topics  . Smoking status: Never Smoker   . Smokeless tobacco: Not on file  . Alcohol Use: Yes  . Drug Use: No  . Sexual Activity: Not on file   Other Topics Concern  . None   Social History Narrative    Additional History:    Sleep: Good  Appetite:  Good   Assessment:   Musculoskeletal: Strength & Muscle Tone: within normal limits Gait & Station: normal Patient leans: N/A   Psychiatric Specialty Exam: Physical Exam  ROS  Blood pressure 94/68, pulse 87, temperature 98.5 F (36.9 C), temperature source Oral, resp. rate 18, height  (1.702 m), weight 83.008 kg (183 lb), last menstrual period 06/11/2015, SpO2 100 %.Body mass index is 28.66 kg/(m^2).  General Appearance: Fairly Groomed  Patent attorney::  Good  Speech:  Clear and Coherent and Normal Rate  Volume:  Normal  Mood:  Good  Affect:  Bright, smiling  Thought Process:  Linear  Orientation:  Full (Time, Place, and Person)  Thought Content:  Negative  Suicidal Thoughts:  No  Homicidal Thoughts:  No  Memory:  Immediate;   Good Recent;   Good Remote;   Good  Judgement:  Fair  Insight:  Fair  Psychomotor Activity:  Negative  Concentration:  Fair  Recall:  Good  Fund of Knowledge:Good  Language: Good  Akathisia:  Negative  Handed:  Right  AIMS (if indicated):     Assets:  Psychologist, counselling Resources/Insurance  ADL's:  Intact  Cognition: WNL  Sleep:  Number of Hours: 7.75     Current Medications: Current Facility-Administered Medications  Medication Dose Route Frequency Provider Last Rate Last Dose  . acetaminophen (  TYLENOL) tablet 650 mg  650 mg Oral Q6H PRN Audery Amel, MD   650 mg at 06/23/15 1442  . alum & mag hydroxide-simeth (MAALOX/MYLANTA) 200-200-20 MG/5ML suspension 30 mL  30 mL Oral Q4H PRN Audery Amel, MD      . clonazePAM Scarlette Calico) tablet 0.5 mg  0.5 mg Oral BID Kerin Salen, MD   0.5 mg at 06/24/15 0857  . hydrOXYzine (ATARAX/VISTARIL) tablet 50 mg  50 mg Oral TID PRN Kerin Salen, MD      . magnesium hydroxide (MILK OF MAGNESIA) suspension 30 mL  30 mL Oral Daily PRN Audery Amel, MD      . pantoprazole (PROTONIX) EC tablet 40 mg  40 mg Oral QAC breakfast Kerin Salen, MD   40 mg at 06/24/15 0857  . QUEtiapine (SEROQUEL) tablet 125 mg  125 mg Oral QHS Kerin Salen, MD   125 mg at 06/23/15 2133  . traZODone (DESYREL) tablet 50 mg  50 mg Oral QHS PRN Kerin Salen, MD        Lab Results:  Results for orders placed or performed during the hospital encounter of 06/22/15 (from the past 48 hour(s))  Urinalysis complete, with microscopic ([facility] only)     Status: Abnormal   Collection Time: 06/22/15  3:15 PM  Result Value Ref Range   Color, Urine AMBER (A) YELLOW   APPearance HAZY (A) CLEAR   Glucose, UA NEGATIVE NEGATIVE mg/dL   Bilirubin Urine NEGATIVE NEGATIVE   Ketones, ur 2+ (A) NEGATIVE mg/dL   Specific Gravity, Urine 1.029 1.005 - 1.030   Hgb urine dipstick NEGATIVE NEGATIVE   pH 5.0 5.0 - 8.0   Protein, ur 30 (A) NEGATIVE mg/dL   Nitrite NEGATIVE NEGATIVE   Leukocytes, UA NEGATIVE NEGATIVE   RBC / HPF NONE SEEN 0 - 5 RBC/hpf   WBC, UA 0-5 0 - 5 WBC/hpf   Bacteria, UA RARE (A) NONE SEEN   Squamous Epithelial / LPF 6-30 (A) NONE SEEN   Mucous PRESENT   Urine Drug Screen, Qualitative ([facility] only)     Status: Abnormal   Collection Time: 06/22/15  3:15 PM  Result Value Ref Range   Tricyclic, Ur Screen POSITIVE (A) NONE DETECTED   Amphetamines, Ur Screen NONE DETECTED NONE DETECTED   MDMA (Ecstasy)Ur Screen NONE DETECTED NONE DETECTED   Cocaine Metabolite,Ur [place] NONE DETECTED NONE DETECTED   Opiate, Ur Screen NONE DETECTED NONE DETECTED   Phencyclidine (PCP) Ur S NONE DETECTED NONE DETECTED   Cannabinoid 50 Ng, Ur [place] NONE DETECTED NONE DETECTED   Barbiturates, Ur Screen NONE DETECTED NONE DETECTED   Benzodiazepine, Ur Scrn NONE DETECTED NONE DETECTED   Methadone Scn, Ur NONE DETECTED NONE DETECTED    Comment: (NOTE) 100  Tricyclics, urine               Cutoff 1000 ng/mL 200  Amphetamines, urine             Cutoff 1000 ng/mL 300  MDMA (Ecstasy), urine           Cutoff 500 ng/mL 400  Cocaine Metabolite, urine       Cutoff 300  ng/mL 500  Opiate, urine                   Cutoff 300 ng/mL 600  Phencyclidine (PCP), urine      Cutoff 25 ng/mL 700  Cannabinoid, urine  Cutoff 50 ng/mL 800  Barbiturates, urine             Cutoff 200 ng/mL 900  Benzodiazepine, urine           Cutoff 200 ng/mL 1000 Methadone, urine                Cutoff 300 ng/mL 1100 1200 The urine drug screen provides only a preliminary, unconfirmed 1300 analytical test result and should not be used for non-medical 1400 purposes. Clinical consideration and professional judgment should 1500 be applied to any positive drug screen result due to possible 1600 interfering substances. A more specific alternate chemical method 1700 must be used in order to obtain a confirmed analytical result.  1800 Gas chromato graphy / mass spectrometry (GC/MS) is the preferred 1900 confirmatory method.   Potassium     Status: Abnormal   Collection Time: 06/23/15  6:40 AM  Result Value Ref Range   Potassium 3.4 (L) 3.5 - 5.1 mmol/L  TSH     Status: None   Collection Time: 06/23/15  6:40 AM  Result Value Ref Range   TSH 1.063 0.350 - 4.500 uIU/mL  Vitamin B12     Status: None   Collection Time: 06/23/15  6:40 AM  Result Value Ref Range   Vitamin B-12 397 180 - 914 pg/mL    Comment: (NOTE) This assay is not validated for testing neonatal or myeloproliferative syndrome specimens for Vitamin B12 levels. Performed at [facility] : [facility]     Physical Findings: AIMS: Facial and Oral Movements Muscles of Facial Expression: None, normal Lips and Perioral Area: None, normal Jaw: None, normal Tongue: None, normal,Extremity Movements Upper (arms, wrists, hands, fingers): None, normal Lower (legs, knees, ankles, toes): None, normal, Trunk Movements Neck, shoulders, hips: None, normal, Overall Severity Severity of abnormal movements (highest score from questions above): None, normal Incapacitation due to abnormal movements: None, normal Patient's awareness  of abnormal movements (rate only patient's report): No Awareness, Dental Status Current problems with teeth and/or dentures?: No Does patient usually wear dentures?: No  CIWA:    COWS:     Treatment Plan Summary: Daily contact with patient to assess and evaluate symptoms and progress in treatment, Medication management and Plan Continue to adjust medications per   Medical Decision Making:  Established Problem, Worsening (2), Review of Medication Regimen & Side Effects (2) and Review of New Medication or Change in Dosage (2)   Bipolar disorder-Will increase Seroquel from 125mg  at bedtime to 150 mg at bedtime.  Anxiety/agitation. Continue clonazepam 0.5 mg twice daily.  B12-patient indicated she's had a history of low B12. Her B12 level was within normal limits at 397.     Wallace Going 06/24/2015, 1:13 PM

## 2015-06-24 NOTE — Plan of Care (Signed)
Problem: Altered Thought Processes AEB Goal: Appropriate behavior observed Outcome: Progressing Pt behavior appropriate

## 2015-06-24 NOTE — Progress Notes (Signed)
Pt visible on the unit. Pt behavior appropriate this evening.. Did not attend group. Med compliant. Denies AVH.

## 2015-06-24 NOTE — Progress Notes (Signed)
Pt pleasant cooperative. Did not attend group. Out of room for meds and bedtime snack. Pt states feeling better. Denies suicidal thoughts.

## 2015-06-24 NOTE — Plan of Care (Signed)
Problem: Ineffective individual coping Goal: STG: Patient will remain free from self harm Outcome: Progressing Denies SI.     

## 2015-06-24 NOTE — [facility] Group Notes (Signed)
[facility] LCSW Group Therapy  06/24/2015 3:41 PM  Type of Therapy:  Group Therapy  Participation Level:  Active  Participation Quality:  Appropriate and Attentive  Affect:  Appropriate  Cognitive:  Alert and Appropriate  Insight:  Engaged  Engagement in Therapy:  Engaged  Modes of Intervention:  Discussion, Education, Role-play, Socialization and Support  Summary of Progress/Problems:Healthy Communication: Pt will discuss the importance of communication. They will be encouraged to share examples of unhealthy communication they have experienced. Also, they will be encouraged to provide strategies for healthy communication.  Hollyann provided many examples of healthy communication strategies for the group. She states if she becomes angry or upset while communicating with someone, she tries to ground herself by cleaning or organizing.   Daisy Floro Elina Streng MSW, LCSWA  06/24/2015, 3:41 PM

## 2015-06-24 NOTE — [facility] Group Notes (Signed)
[facility] Group Notes:  (Nursing/MHT/Case Management/Adjunct)  Date:  06/24/2015  Time:  10:59 AM  Type of Therapy:  Goals  Participation Level:  Active  Participation Quality:  Appropriate  Affect:  Appropriate  Cognitive:  Appropriate  Insight:  Good  Engagement in Group:  [employer] 06/24/2015, 10:59 AM

## 2015-06-24 NOTE — Plan of Care (Signed)
Problem: Ineffective individual coping Goal: STG-Increase in ability to manage activities of daily living Outcome: Progressing Patient with good adls. Enjoys visit with husband.

## 2015-06-25 DIAGNOSIS — K219 Gastro-esophageal reflux disease without esophagitis: Secondary | ICD-10-CM | POA: Diagnosis present

## 2015-06-25 MED ORDER — HYDROXYZINE HCL 50 MG PO TABS: 50.0000 mg | ORAL_TABLET | Freq: Three times a day (TID) | ORAL | Status: DC | PRN

## 2015-06-25 MED ORDER — CLONAZEPAM 0.5 MG PO TABS: 0.5000 mg | ORAL_TABLET | Freq: Two times a day (BID) | ORAL | Status: DC

## 2015-06-25 MED ORDER — TRAZODONE HCL 50 MG PO TABS: 50.0000 mg | ORAL_TABLET | Freq: Every evening | ORAL | Status: DC | PRN

## 2015-06-25 MED ORDER — QUETIAPINE FUMARATE 50 MG PO TABS
150.0000 mg | ORAL_TABLET | Freq: Every day | ORAL | Status: DC
Start: 2015-06-25 — End: 2016-11-09

## 2015-06-25 MED ORDER — PANTOPRAZOLE SODIUM 40 MG PO TBEC: 40.0000 mg | DELAYED_RELEASE_TABLET | Freq: Every day | ORAL | Status: DC

## 2015-06-25 NOTE — Progress Notes (Signed)
Recreation Therapy Notes  Date: 06.27.16 Time: 3:00 pm Location: Craft Room  Group Topic: Wellness  Goal Area(s) Addresses:  Patient will identify at least one item per dimension of health. Patient will examine areas they are deficient.  Behavioral Response: Attentive, Interactive  Intervention: 6 Dimensions of Health  Activity: Patients were given a worksheet with the definitions of the 6 dimensions of health and instructed to read the worksheet. Patients were given a worksheet with the 6 dimensions on it and instructed to list at least one thing they are currently doing in each category.  Education: LRT educated patient on the 6 dimensions of health   Education Outcome: In group clarification offered  Clinical Observations/Feedback: Patient completed activity by listing at least one thing in each category. Patient contributed to group discussion by stating what categories she was not giving enough attention to.  Jacquelynn Cree, LRT/CTRS 06/25/2015 4:34 PM

## 2015-06-25 NOTE — [facility] Group Notes (Addendum)
[facility] LCSW Group Therapy  06/25/2015 4:45 PM  Type of Therapy:  Group Therapy  Participation Level:  Active  Participation Quality:  Appropriate  Affect:  Appropriate  Cognitive:  Appropriate  Insight:  Engaged  Engagement in Therapy:  Engaged  Modes of Intervention:  Discussion, Exploration, Socialization and Support  Summary of Progress/Problems::LCSW reviewed group rules. Patients were to explore thoughts and feelings when their lives become unbalanced. As a collective group patients were encouraged to offer peer to peer support and to find ways to improve overall balance in their lives and to maintain their balance. Several thoughts, and ideas were shared in the group. This patient was able to support  Cheron Schaumann 06/25/2015, 4:45 PM

## 2015-06-25 NOTE — Progress Notes (Signed)
Recreation Therapy Notes  INPATIENT RECREATION TR PLAN  Patient Details Name: Debbie Golden MRN: [id number] DOB: 1971-08-06 Today's Date: 06/25/2015  Rec Therapy Plan Is patient appropriate for Therapeutic Recreation?: Yes Treatment times per week: At least 3 times a week TR Treatment/Interventions: 1:1 session, Group participation (Comment) (Appropriate participation in daily recreation therapy tx)  Discharge Criteria Pt will be discharged from therapy if:: Discharged Treatment plan/goals/alternatives discussed and agreed upon by:: Patient/family  Discharge Summary Short term goals set: See Care Plan Short term goals met: Complete, Adequate for discharge Progress toward goals comments: One-to-one attended Which groups?: Communication, Social skills One-to-one attended: Self-esteem, stress management, coping skills Reason goals not met: Patient planning to d/c before goals could be met Therapeutic equipment acquired: None Reason patient discharged from therapy: Discharge from hospital Pt/family agrees with progress & goals achieved: Yes Date patient discharged from therapy: 06/25/15   Leonette Monarch, LRT/CTRS 06/25/2015, 1:49 PM

## 2015-06-25 NOTE — Progress Notes (Signed)
CSW met with Patient to confirm dc plans. Pt would like a new provider in [place] area, as this is where she is going to permanently relocate. CSW notified Pt that her previous provider would be able to provide follow up support for medication management sooner than a new provider. CSW advise Pt return to previous provider for initial follow up and then transition to [place] provider. Pt and her fiance both in agreement with plan. CSW confirmed July 14th appt with psychiatrist at [facility] in [place]. Pt stated that she would like to return to previous therapist in [place] because she stated it was a good fit. She is also interested in [place] providers. CSW left message with previous therapist and gave patient information to LC[facility] at [facility] in [place] who are in network with insurance.   CSW reviewed plan with Pt's finance over the phone while sitting with the patient.  Pt's fiance offered supportive comments and stated that she is excited to hear that she is ready to discharge today. Fiance will provide transportation at discharge.   CSW gave Pt AA schedule for [place] as well.   Pt verbalized that she was most pleased with programming while on the unit while she has been at [facility].   Toma Copier, [place]
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
## 2015-06-25 NOTE — [facility] Group Notes (Signed)
[facility] Group Notes:  (Nursing/MHT/Case Management/Adjunct)  Date:  06/25/2015  Time:  4:52 AM  Type of Therapy:  Evening Wrap-up Group/Outside  Participation Level:  Did Not Attend  Participation Quality:  N/A  Affect:  N/A  Cognitive:  N/A  Insight:  None  Engagement in Group:  None  Modes of Intervention:  N/A  Summary of Progress/Problems:  Tomasita MorrowChelsea Nanta Melanee Cordial 06/25/2015, 4:52 AM

## 2015-06-25 NOTE — Plan of Care (Signed)
Problem: [facility] Participation in Recreation Therapeutic Interventions Goal: STG-Patient will demonstrate improved self esteem by identif STG: Self-Esteem - Within 5 treatment sessions, patient will verbalize at least 5 positive affirmation statements in each of 3 treatment sessions to increase self-esteem post d/c.  Outcome: Adequate for Discharge Treatment Session 2; Completed 2 out of 3: At approximately 12:35 pm, LRT met with patient in patient room. Patient verbalized 5 positive affirmation statements. Patient reported it felt "much better than the first time". LRT encouraged patient to continue saying positive affirmation statements.   Leonette Monarch, LRT/CTRS 06.27.16 1:43 pm Goal: STG-Patient will identify at least five coping skills for ** STG: Coping Skills - Within 4 treatment sessions, patient will verbalize at least 5 coping skills for substance abuse in each of 2 treatment sessions to decrease substance abuse post d/c.  Outcome: Completed/Met Date Met:  06/25/15 Treatment Session 2; Completed 2 out of 2: At approximately 12:35 pm, LRT met with patient in patient room. Patient verbalized 5 coping skills for substance abuse. LRT encouraged patient to use her schedules and coping skills.  Leonette Monarch, LRT/CTRS 06.27.16 1:45 pm Goal: STG-Other Recreation Therapy Goal (Specify) STG: Stress Management - Within 5 treatment sessions, patient will demonstrate at least one stress management technique in each of 2 treatment sessions to increase stress management skills post d/c.  Outcome: Adequate for Discharge Treatment Session 2; Completed 1 out of 2: At approximately 12:35 pm, LRT met with patient in patient room. Patient reported she read and practiced the stress management techniques. Patient demonstrated one stress management technique. LRT encouraged patient to continue practicing the stress management techniques.  Leonette Monarch, LRT/CTRS 06.27.16 1:48 pm
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
## 2015-06-25 NOTE — Progress Notes (Signed)
Pt discharged home. DC instructions provided and explained. Medications reviewed. Rx given. All questions answered. Pt stable at discharge. Denies SI, HI, AVH. 

## 2015-06-25 NOTE — [facility] Group Notes (Signed)
[facility] LCSW Aftercare Discharge Planning Group Note  06/25/2015 4:56 PM  Participation Quality:  Appropriate  Affect:  Appropriate  Cognitive:  Appropriate  Insight:  Developing/Improving  Engagement in Group:  Developing/Improving  Modes of Intervention:  Discussion, Education, Exploration and Support  Summary of Progress/Problems: Patients goal for today is see the doctor  Cheron Schaumann 06/25/2015, 4:56 PM

## 2015-06-26 NOTE — Progress Notes (Signed)
AVS H&P Discharge Summary faxed to [facility]([place])

## 2016-11-09 ENCOUNTER — Emergency Department: Payer: BLUE CROSS/BLUE SHIELD

## 2016-11-09 ENCOUNTER — Encounter: Payer: Self-pay | Admitting: Emergency Medicine

## 2016-11-09 ENCOUNTER — Emergency Department
Admission: EM | Admit: 2016-11-09 | Discharge: 2016-11-10 | Disposition: A | Discharge status: Other Acute Inpatient Hospital | Payer: BLUE CROSS/BLUE SHIELD | Attending: Student in an Organized Health Care Education/Training Program | Admitting: Student in an Organized Health Care Education/Training Program

## 2016-11-09 DIAGNOSIS — Z91199 Patient's noncompliance with other medical treatment and regimen due to unspecified reason: Secondary | ICD-10-CM

## 2016-11-09 DIAGNOSIS — Z9119 Patient's noncompliance with other medical treatment and regimen: Secondary | ICD-10-CM

## 2016-11-09 DIAGNOSIS — F061 Catatonic disorder due to known physiological condition: Secondary | ICD-10-CM

## 2016-11-09 DIAGNOSIS — F3164 Bipolar disorder, current episode mixed, severe, with psychotic features: Secondary | ICD-10-CM | POA: Diagnosis present

## 2016-11-09 DIAGNOSIS — Z79899 Other long term (current) drug therapy: Secondary | ICD-10-CM | POA: Insufficient documentation

## 2016-11-09 DIAGNOSIS — Z046 Encounter for general psychiatric examination, requested by authority: Secondary | ICD-10-CM

## 2016-11-09 DIAGNOSIS — R4182 Altered mental status, unspecified: Secondary | ICD-10-CM | POA: Diagnosis present

## 2016-11-09 LAB — COMPREHENSIVE METABOLIC PANEL
ALT: 50 U/L (ref 14–54)
AST: 73 U/L — AB (ref 15–41)
Albumin: 4.4 g/dL (ref 3.5–5.0)
Alkaline Phosphatase: 104 U/L (ref 38–126)
Anion gap: 11 (ref 5–15)
BUN: 14 mg/dL (ref 6–20)
CHLORIDE: 105 mmol/L (ref 101–111)
CO2: 24 mmol/L (ref 22–32)
CREATININE: 0.99 mg/dL (ref 0.44–1.00)
Calcium: 9.8 mg/dL (ref 8.9–10.3)
GFR calc non Af Amer: >60 mL/min (ref >60)
Glucose, Bld: 102 mg/dL — ABNORMAL HIGH (ref 65–99)
Potassium: 3.6 mmol/L (ref 3.5–5.1)
SODIUM: 140 mmol/L (ref 135–145)
Total Bilirubin: 1.2 mg/dL (ref 0.3–1.2)
Total Protein: 8.3 g/dL — ABNORMAL HIGH (ref 6.5–8.1)

## 2016-11-09 LAB — CBC WITH DIFFERENTIAL/PLATELET
BASOS ABS: 0 10*3/uL (ref 0–0.1)
Basophils Relative: 0 %
Eosinophils Absolute: 0 10*3/uL (ref 0–0.7)
Eosinophils Relative: 0 %
HEMATOCRIT: 42.8 % (ref 35.0–47.0)
HEMOGLOBIN: 14.1 g/dL (ref 12.0–16.0)
LYMPHS PCT: 15 %
Lymphs Abs: 1.2 10*3/uL (ref 1.0–3.6)
MCH: 34.6 pg — ABNORMAL HIGH (ref 26.0–34.0)
MCHC: 32.9 g/dL (ref 32.0–36.0)
MCV: 105 fL — AB (ref 80.0–100.0)
MONO ABS: 0.6 10*3/uL (ref 0.2–0.9)
Monocytes Relative: 8 %
NEUTROS ABS: 6 10*3/uL (ref 1.4–6.5)
Neutrophils Relative %: 77 %
Platelets: 228 10*3/uL (ref 150–440)
RBC: 4.08 MIL/uL (ref 3.80–5.20)
RDW: 19.3 % — AB (ref 11.5–14.5)
WBC: 7.9 10*3/uL (ref 3.6–11.0)

## 2016-11-09 LAB — URINALYSIS COMPLETE WITH MICROSCOPIC (ARMC ONLY)
Glucose, UA: NEGATIVE mg/dL
HGB URINE DIPSTICK: NEGATIVE
LEUKOCYTES UA: NEGATIVE
Nitrite: NEGATIVE
PH: 5 (ref 5.0–8.0)
PROTEIN: 100 mg/dL — AB
RBC / HPF: NONE SEEN RBC/hpf (ref 0–5)
SPECIFIC GRAVITY, URINE: 1.029 (ref 1.005–1.030)

## 2016-11-09 LAB — URINE DRUG SCREEN, QUALITATIVE (ARMC ONLY)
Amphetamines, Ur Screen: NOT DETECTED
BENZODIAZEPINE, UR SCRN: NOT DETECTED
Barbiturates, Ur Screen: NOT DETECTED
CANNABINOID 50 NG, UR ~~LOC~~: NOT DETECTED
Cocaine Metabolite,Ur ~~LOC~~: NOT DETECTED
MDMA (Ecstasy)Ur Screen: NOT DETECTED
Methadone Scn, Ur: NOT DETECTED
OPIATE, UR SCREEN: NOT DETECTED
PHENCYCLIDINE (PCP) UR S: NOT DETECTED
Tricyclic, Ur Screen: NOT DETECTED

## 2016-11-09 LAB — SALICYLATE LEVEL

## 2016-11-09 LAB — ACETAMINOPHEN LEVEL

## 2016-11-09 LAB — ETHANOL: Alcohol, Ethyl (B): <5 mg/dL (ref <5)

## 2016-11-09 LAB — GLUCOSE, CAPILLARY: GLUCOSE-CAPILLARY: 104 mg/dL — AB (ref 65–99)

## 2016-11-09 LAB — PREGNANCY, URINE: Preg Test, Ur: NEGATIVE

## 2016-11-09 MED ORDER — LORAZEPAM 2 MG/ML IJ SOLN
1.0000 mg | Freq: Once | INTRAMUSCULAR | Status: AC
  Administered 2016-11-09: 1 mg via INTRAVENOUS
  Filled 2016-11-09: qty 1
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
## 2016-11-09 NOTE — ED Notes (Signed)
Pt dressed out by this tech and Paulette,EDT. Pt belongings consisted of one pair or white earrings,a brown hair clip,black socks,gray bra,green shirt that has been cut,gray sweat pants that has been cut, and a pair of gray panties that were also cut.

## 2016-11-09 NOTE — ED Triage Notes (Signed)
Patient from home via [facility] with c/o AMS since Friday. Patient is non-responsive however responds appropriately to painful stimuli. Patient has appropriate flinch response as well and passes arm-drop test.

## 2016-11-09 NOTE — Progress Notes (Signed)
Spoke wih Dr. Roxan Hockeyobinson earlier, and pt. RN attempted TTS consult, pt. Still unresponsive. Debbie Golden, LCAS-A, LPC-A, [facility]  Counselor 11/09/2016 5:54 PM

## 2016-11-09 NOTE — ED Notes (Addendum)
Urine cath and undressing of patient by DGSweeney and ARiley. Patient made purposeful "reaching and grabbing" movements when this RN rolled her from side to side. Patient adjusted herself in the bed for comfort. However, patient is still not communicating with staff

## 2016-11-09 NOTE — ED Notes (Signed)
pt sitting in bed, not oriented to situation, SOC telepysch at bedside  Pt reports she takes no meds and has no diagnosis, in ED d/t having "a drink. That is all."

## 2016-11-09 NOTE — ED Provider Notes (Signed)
[facility] Emergency Department Provider Note  ____________________________________________   I have reviewed the triage vital signs and the nursing notes.   HISTORY  Chief Complaint Altered Mental Status    HPI Debbie Golden is a 45 y.o. female presents today with altered mental status.Level 5 chart caveat; no further history available from patient due to patient status. Patient is here because she has been somewhat catatonic this morning. According to her significant other, patient has been off of her psych medications for some time,. She started acting "a little funny" on Friday, began to have hyperreligious discussions with him yesterday including referring to him as an NigerArchangel he went to work yesterday when he came back she was still acting like that and she would have fits where she wouldn't talk. Then, this morning, patient became somewhat catatonic. She talked a little bit apparently. He feels very sure that she has no si and did not overdose.  He does feel she is not compliant with her meds. She has had very similar psychotic breaks in the pas.     Past Medical History:  Diagnosis Date  . Bipolar disorder (HCC)   . Depression     Patient Active Problem List   Diagnosis Date Noted  . GERD (gastroesophageal reflux disease) 06/25/2015  . Bipolar disorder, curr episode mixed, severe, with psychotic features (HCC) 06/22/2015  . Bipolar disorder (manic depression) (HCC) 06/21/2015  . Hypokalemia 06/21/2015  . Delirium, acute 06/19/2015  . Major depression 06/19/2015  . Alcohol abuse 06/19/2015    Past Surgical History:  Procedure Laterality Date  . ANKLE FRACTURE SURGERY Right   . GASTRIC BYPASS N/A 2007    Prior to Admission medications   Medication Sig Start Date End Date Taking? Authorizing Provider  clonazePAM (KLONOPIN) 0.5 MG tablet Take 1 tablet (0.5 mg total) by mouth 2 (two) times daily. 06/25/15   Shari ProwsJolanta B Pucilowska, MD  hydrOXYzine  (ATARAX/VISTARIL) 50 MG tablet Take 1 tablet (50 mg total) by mouth 3 (three) times daily as needed for anxiety. 06/25/15   Shari ProwsJolanta B Pucilowska, MD  pantoprazole (PROTONIX) 40 MG tablet Take 1 tablet (40 mg total) by mouth daily before breakfast. 06/25/15   Jolanta B Pucilowska, MD  QUEtiapine (SEROQUEL) 50 MG tablet Take 3 tablets (150 mg total) by mouth at bedtime. 06/25/15   Shari ProwsJolanta B Pucilowska, MD  traZODone (DESYREL) 50 MG tablet Take 1 tablet (50 mg total) by mouth at bedtime as needed for sleep. 06/25/15   Shari ProwsJolanta B Pucilowska, MD    Allergies Patient has no known allergies.  Family History  Problem Relation Age of Onset  . Adopted: Yes    Social History Social History  Substance Use Topics  . Smoking status: Never Smoker  . Smokeless tobacco: Not on file  . Alcohol use Yes    Review of Systems Cannot obtain second patient mental status ____________________________________________   PHYSICAL EXAM:  VITAL SIGNS: ED Triage Vitals  Enc Vitals Group     BP 11/09/16 1015 (!) 162/103     Pulse Rate 11/09/16 1015 (!) 101     Resp 11/09/16 1015 18     Temp --      Temp src --      SpO2 11/09/16 1015 99 %     Weight 11/09/16 1035 183 lb (83 kg)     Height 11/09/16 1035 5\' 6"  (1.676 m)     Head Circumference --      Peak Flow --  Pain Score --      Pain Loc --      Pain Edu? --      Excl. in GC? --     Constitutional: Patient is, clinically, awake but keeping her eyes closed, she responds to painful stimuli, when I hold her arm over her head and drop it, she holds it up. There is no cogwheel rigidity or any other kind of rigidity. No seizure activity. Eyes:  Patient forcing her eyes closed I cannot open them . Head: Atraumatic. Nose: No congestion/rhinnorhea. Mouth/Throat: Mucous membranes are moist.  Oropharynx non-erythematous. Neck: No stridor.   Nontender with no meningismus Cardiovascular: Normal rate, regular rhythm. Grossly normal heart sounds.  Good  peripheral circulation. Respiratory: Normal respiratory effort.  No retractions. Lungs CTAB. Abdominal: Soft and nontender. No distention. No guarding no rebound Back:  There is no focal tenderness or step off.  there is no midline tenderness there are no lesions noted. there is no CVA tenderness Musculoskeletal: No lower extremity tenderness, no upper extremity tenderness. No joint effusions, no DVT signs strong distal pulses no edema Neurologic:  Limited exam no focal weakness noted. Negative Babinski  Skin:  Skin is warm, dry and intact. No rash noted.   ____________________________________________   LABS (all labs ordered are listed, but only abnormal results are displayed)  Labs Reviewed  COMPREHENSIVE METABOLIC PANEL - Abnormal; Notable for the following:       Result Value   Glucose, Bld 102 (*)    Total Protein 8.3 (*)    AST 73 (*)    All other components within normal limits  CBC WITH DIFFERENTIAL/PLATELET - Abnormal; Notable for the following:    MCV 105.0 (*)    MCH 34.6 (*)    RDW 19.3 (*)    All other components within normal limits  GLUCOSE, CAPILLARY - Abnormal; Notable for the following:    Glucose-Capillary 104 (*)    All other components within normal limits  URINE CULTURE  ACETAMINOPHEN LEVEL  ETHANOL  SALICYLATE LEVEL  URINALYSIS COMPLETEWITH MICROSCOPIC ([facility] ONLY)  URINE DRUG SCREEN, QUALITATIVE ([facility] ONLY)  PREGNANCY, URINE  CBG MONITORING, ED   ____________________________________________  EKG  I personally interpreted any EKGs ordered by me or triage Sinus rhythm at 90 bpm no acute ST elevation or acute ST depression normal axis QTc is 321 ____________________________________________  RADIOLOGY  I reviewed any imaging ordered by me or triage that were performed during my shift and, if possible, patient and/or family made aware of any abnormal findings. ____________________________________________   PROCEDURES  Procedure(s) performed:  None  Procedures  Critical Care performed: None  ____________________________________________   INITIAL IMPRESSION / ASSESSMENT AND PLAN / ED COURSE  Pertinent labs & imaging results that were available during my care of the patient were reviewed by me and considered in my medical decision making (see chart for details).  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 No evidence of focal neurologic deficit. Very similar to prior. Antecedent hyperreligiosity is certainly of concordance with this diagnosis. We are checking routine blood work etc. Patient was given Ativan her blood pressure somewhat up I think she is somewhat stressed. She will require IVC, and psychiatric evaluation. I have low suspicion for acute head injury causing a uniform resistance to interaction with us. Certainly low suspicion given history of calling her boyfriend an NigerArchangel prior to going into catatonia.  Clinical Course    ____________________________________________   FINAL CLINICAL IMPRESSION(S) / ED DIAGNOSES  Final diagnoses:  None      This chart was dictated using voice recognition software.  Despite best efforts to proofread,  errors can occur which can change meaning.      Jeanmarie Plant, MD 11/09/16 [phone]

## 2016-11-09 NOTE — ED Notes (Signed)
Pt sitting up in bed, ice water given, Pt is responding opening eyes spontaneously. Lights dimmed for patient's comfort.

## 2016-11-09 NOTE — ED Notes (Signed)
TTS notified patient is able to communicate and answer questions

## 2016-11-09 NOTE — ED Notes (Signed)
Patient is IVC and is pending placement. 

## 2016-11-09 NOTE — ED Notes (Addendum)
Pt states the last thing she drank was wine, pt reports drinking 3 bottles of wine a day. Pt has a mild tremor, unable to state when she last drank wine.  Pt states she came in today because of abdominal pain and her fiancee brought her in because she wasn't able to walk. Denies any SI/HI at this time.

## 2016-11-09 NOTE — ED Notes (Signed)
SOC machine placed in room at this time.

## 2016-11-09 NOTE — ED Notes (Signed)
TTS at bedside, pt unable to verbally communicate.

## 2016-11-09 NOTE — Progress Notes (Signed)
Attempted TTS, Pt. RN performed sternal rub, and pt. Still non responsive Will attempt TTS again when pt. Is alert Gaby Harney K. Sherlon HandingHarris, LCAS-A, LPC-A, [facility]  Counselor 11/09/2016 12:38 PM

## 2016-11-09 NOTE — ED Notes (Signed)
Pt to CT [employer] officer at bedside.

## 2016-11-09 NOTE — ED Notes (Signed)
Pt ambulatory in room ate dinner tray no c/o at this time. Pt states she last drank alcohol last week but none today.  Pt ambulating without difficulty.

## 2016-11-09 NOTE — ED Notes (Signed)
Dr. Mayford KnifeWilliams notified of patients history of drinking 3 bottles of wine daily.

## 2016-11-10 DIAGNOSIS — F061 Catatonic disorder due to known physiological condition: Secondary | ICD-10-CM

## 2016-11-10 DIAGNOSIS — Z9119 Patient's noncompliance with other medical treatment and regimen: Secondary | ICD-10-CM

## 2016-11-10 DIAGNOSIS — Z91199 Patient's noncompliance with other medical treatment and regimen due to unspecified reason: Secondary | ICD-10-CM

## 2016-11-10 DIAGNOSIS — F3164 Bipolar disorder, current episode mixed, severe, with psychotic features: Secondary | ICD-10-CM

## 2016-11-10 DIAGNOSIS — Z046 Encounter for general psychiatric examination, requested by authority: Secondary | ICD-10-CM

## 2016-11-10 LAB — URINE CULTURE: Culture: NO GROWTH

## 2016-11-10 LAB — GLUCOSE, CAPILLARY: GLUCOSE-CAPILLARY: 82 mg/dL (ref 65–99)

## 2016-11-10 MED ORDER — QUETIAPINE FUMARATE 200 MG PO TABS: 400.0000 mg | ORAL_TABLET | Freq: Every day | ORAL | Status: DC

## 2016-11-10 MED ORDER — LAMOTRIGINE 25 MG PO TABS
25.0000 mg | ORAL_TABLET | Freq: Every day | ORAL | Status: DC
  Filled 2016-11-10: qty 1

## 2016-11-10 MED ORDER — LORAZEPAM 1 MG PO TABS: 1.0000 mg | ORAL_TABLET | ORAL | Status: DC

## 2016-11-10 MED ORDER — LORAZEPAM 1 MG PO TABS: 1.0000 mg | ORAL_TABLET | Freq: Four times a day (QID) | ORAL | Status: DC | PRN

## 2016-11-10 MED ORDER — ZIPRASIDONE MESYLATE 20 MG IM SOLR: 10.0000 mg | Freq: Four times a day (QID) | INTRAMUSCULAR | Status: DC | PRN

## 2016-11-10 MED ORDER — TRAZODONE HCL 50 MG PO TABS: 50.0000 mg | ORAL_TABLET | Freq: Every day | ORAL | Status: DC

## 2016-11-10 MED ORDER — LORAZEPAM 2 MG/ML IJ SOLN: 1.0000 mg | INTRAMUSCULAR | Status: DC

## 2016-11-10 MED ORDER — QUETIAPINE FUMARATE 25 MG PO TABS: 100.0000 mg | ORAL_TABLET | Freq: Every day | ORAL | Status: DC

## 2016-11-10 MED ORDER — CLONAZEPAM 0.5 MG PO TBDP
0.5000 mg | ORAL_TABLET | Freq: Two times a day (BID) | ORAL | Status: DC
  Filled 2016-11-10: qty 1

## 2016-11-10 MED ORDER — LORAZEPAM 2 MG/ML IJ SOLN
1.0000 mg | INTRAMUSCULAR | Status: DC
  Administered 2016-11-10 (×3): 1 mg via INTRAMUSCULAR
  Filled 2016-11-10 (×3): qty 1

## 2016-11-10 NOTE — ED Notes (Signed)
No change in pt condition. Pt laying in bed. Has not changed positions. Will continue to monitor. Maintained on 15 minute checks and observation by security camera for safety.

## 2016-11-10 NOTE — ED Notes (Signed)
Psychiatrist at bedside. Maintained on 15 minute checks and observation by security camera for safety. 

## 2016-11-10 NOTE — ED Notes (Signed)
Pt denies SI/HI/AVH. Pt given discharge instructions. Pt states understanding. Pt states receipt of all belongings. Patient in sheriff custody for transport [facility].

## 2016-11-10 NOTE — ED Notes (Signed)
IVC/Accepted to [facility]@ 9 am/Transport informed needs to be reverified in Am

## 2016-11-10 NOTE — ED Notes (Signed)
Pt laying in bed with eye open. Pt would not look at or respond to RN. UTA if pt endorses SI or HI.  Before RN left pt's room, pt stated, "Thank you Amy. I don't need anything right now."   Maintained on 15 minute checks and observation by security camera for safety.

## 2016-11-10 NOTE — Progress Notes (Signed)
Referral information for Inpatient Placement has been faxed to:    [facility] ([phone]),    [facility] [phone]),    [facility] [phone]),    [facility] [phone]).   [facility] [phone]([phone])

## 2016-11-10 NOTE — ED Notes (Signed)
ED BHU PLACEMENT JUSTIFICATION Is the patient under IVC or is there intent for IVC: Yes.   Is the patient medically cleared: Yes.   Is there vacancy in the ED BHU: Yes.   Is the population mix appropriate for patient: Yes.   Is the patient awaiting placement in inpatient or outpatient setting: Yes.   Has the patient had a psychiatric consult: Yes.   Survey of unit performed for contraband, proper placement and condition of furniture, tampering with fixtures in bathroom, shower, and each patient room: Yes.   APPEARANCE/BEHAVIOR calm and uncooperative NEURO ASSESSMENT Orientation: person Hallucinations: Yes.  Auditory Hallucinations Speech: Normal Gait: normal RESPIRATORY ASSESSMENT Normal expansion.  Clear to auscultation.  No rales, rhonchi, or wheezing. CARDIOVASCULAR ASSESSMENT regular rate and rhythm, S1, S2 normal, no murmur, click, rub or gallop GASTROINTESTINAL ASSESSMENT soft, nontender, BS WNL, no r/g EXTREMITIES normal strength, tone, and muscle mass PLAN OF CARE Provide calm/safe environment. Vital signs assessed twice daily. ED BHU Assessment once each 12-hour shift. Collaborate with intake RN daily or as condition indicates. Assure the ED provider has rounded once each shift. Provide and encourage hygiene. Provide redirection as needed. Assess for escalating behavior; address immediately and inform ED provider.  Assess family dynamic and appropriateness for visitation as needed: Yes.   Educate the patient/family about BHU procedures/visitation: Yes.

## 2016-11-10 NOTE — ED Notes (Signed)
Pt nodded head when asked if she wanted a drink. Pt given a Sprite and is starting to answer questions with one word answers. Pt has also turned on her side in bed.   Maintained on 15 minute checks and observation by security camera for safety.

## 2016-11-10 NOTE — ED Notes (Signed)
Report was received from Nell RangeNoel W., RN; Pt. Is mute; presented to the ED in a catatonic state;  Continue to monitor with 15 min. Monitoring.

## 2016-11-10 NOTE — ED Notes (Signed)
Patient resting quietly in room. No noted distress or abnormal behaviors noted. Will continue 15 minute checks and observation by security camera for safety. 

## 2016-11-10 NOTE — ED Notes (Signed)
Pt's fiance called to check on her condition Retia Passe(Tony Bigelow [phone]). Caller had the pt's code number.  Mr. Mitzie NaBigelow would like to be notified when the pt is transferred.

## 2016-11-10 NOTE — ED Notes (Addendum)
Pt sitting on bedside and given a 2nd drink. Pt asking when and where she will be transferred. RN explained this to pt. Pt accepting. Pt stated she has been going through a lot of changes. "Another reason I was brought in was because of alcohol."   Maintained on 15 minute checks and observation by security camera for safety.

## 2016-11-10 NOTE — ED Notes (Signed)
Transportation will be here in 10 minutes. Pt clothing will be search before it is given to pt. Belongings will be given to officer.   Maintained on 15 minute checks and observation by security camera for safety.

## 2016-11-10 NOTE — ED Notes (Signed)
Pt remains catatonic. VS being monitored. Psychiatrist made aware. Medications given as ordered.   Maintained on 15 minute checks and observation by security camera for safety.

## 2016-11-10 NOTE — Consult Note (Signed)
  Psychiatry: Follow-up note this afternoon. Patient this afternoon has shown herself to be more mobile. When I went in to speak to her I was able to get her to sit up and then stand up on her own. She is still not speaking very much but she admits that she feels a little better than earlier today. Seems to of been pulling out of her catatonia little bit. Continue order for Ativan. We will hope that she is mobile enough the weekend try to get her actually admitted to the hospital sooner rather than later.

## 2016-11-10 NOTE — Progress Notes (Signed)
Patient has been accepted to [facility].  [facility]ing physician is Dr. Robet Leuhotakura.  Call report to [phone].  ER Staff is aware of it Rivka Barbara( [facility].; Dr. York CeriseForbach, ER MD & Claris CheMargaret Patient's Nurse)    TTS Spoke with The[facility]Warrin [facility].

## 2016-11-10 NOTE — ED Notes (Signed)
Dr. Toni Amendlapacs gave verbal order to RN that Ativan can be given PO Q2H if pt can tolerate it.  If not Ativan can be given IM.

## 2016-11-10 NOTE — ED Notes (Signed)
IVC/Accepted to [facility]@ 9 am/Transport informed needs to be reverified in Am  

## 2016-11-10 NOTE — Consult Note (Signed)
[facility] Consult   Reason for Consult:  Consult for 45 year old woman with a history of mental illness who presents catatonic Referring Physician:  [place] Patient Identification: Debbie Golden MRN:  [id number] Principal Diagnosis: Bipolar disorder, curr episode mixed, severe, with psychotic features ([place]) Diagnosis:   Patient Active Problem List   Diagnosis Date Noted  . 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 Noncompliance [Z91.19] 11/10/2016  . Involuntary commitment [Z04.6] 11/10/2016  . GERD (gastroesophageal reflux disease) [K21.9] 06/25/2015  . Bipolar disorder, curr episode mixed, severe, with psychotic features ([place]) [F31.64] 06/22/2015  . Bipolar disorder (manic depression) ([place]) [F31.9] 06/21/2015  . Hypokalemia [E87.6] 06/21/2015  . Delirium, acute [R41.0] 06/19/2015  . Major depression [F32.9] 06/19/2015  . Alcohol abuse [F10.10] 06/19/2015    Total Time spent with patient: 45 minutes  Subjective:   Debbie Golden is a 45 y.o. female patient admitted with "I haven't been able to sleep".  HPI:  Patient interviewed. Patient was unable to give very much history. Chart reviewed. Patient examined labs reviewed. This is a 45 year old woman who presented to the hospital with acute catatonia. The history given by the husband was that she had seemed a little odd for a few days but then had made what sounds like a clearly psychotic statement on Friday and the next thing he knew she had become unresponsive and catatonic. Since coming to the emergency room she is essentially been catatonic continuously. When I spoke to her this morning she did tell me that she had not been able to sleep and said it had been 2-3 weeks. She couldn't answer any other follow-up questions about that. She did tell me that she had been off of her medicine. She tells me that she had been on it for so long that she doesn't know how long she is been off of it. She wouldn't  answer any other follow-up questions to that. Would make no eye contact. Was almost completely immobile. Demonstrated waxy flexibility in her upper extremities. It sounds like she has been off of psychiatric medicine for an unclear period of time. The report from the husband was that she had been hyper religious for a few days and then had called him and are Debbie Golden on Friday. Workup shows no drugs or alcohol no remarkable lab findings CT scan all normal.  Social history: Lives with her husband. I'm not certain if there are children at home or anything else about the social situation right now.  Medical history: No known ongoing medical problems outside of the psychiatric we have identified.  Substance abuse history: No reported history and no drugs or alcohol in the labs.  Past Psychiatric History: Patient presented here to the hospital in June 2016 in a similar state with what sounded like an acute psychotic situation. She was initially diagnosed as just a psychotic episode. She seemed to respond to fairly low doses of medicine. It looks like she followed up with an outpatient provider after that and a diagnosis was made at some point of psychotic depression or bipolar disorder with psychotic depression. Her most recent medicines were a larger dose of Seroquel at 400 mg at night as well as lamotrigine reported to be at 100 mg a day trazodone 100 mg at night. Unclear to me if she's had any other hospitalization she is not able to answer any other questions about it. No known suicide attempts in the past or violence. No evidence presented that she has overdosed  or done anything acutely to harm herself.  Risk to Self: Suicidal Ideation: No Suicidal Intent: No Is patient at risk for suicide?: No Suicidal Plan?: No Access to Means: No What has been your use of drugs/alcohol within the last 12 months?: Denied use (Reported earlier that she was drinking, now she does not ) How many times?: 0 Other Self  Harm Risks: denied Triggers for Past Attempts: None known Intentional Self Injurious Behavior: None Risk to Others: Homicidal Ideation: No Thoughts of Harm to Others: No Current Homicidal Intent: No Current Homicidal Plan: No Access to Homicidal Means: No Identified Victim: None Identified History of harm to others?: No Assessment of Violence: None Noted Violent Behavior Description: denied Does patient have access to weapons?: No Criminal Charges Pending?: No Does patient have a court date: No Prior Inpatient Therapy: Prior Inpatient Therapy: No Prior Therapy Dates: n/a Prior Therapy Facilty/Provider(s): n/a Reason for Treatment: n/a Prior Outpatient Therapy: Prior Outpatient Therapy: Yes Prior Therapy Dates: 2016 Prior Therapy Facilty/Provider(s): [facility] Reason for Treatment: Anxiety Does patient have an ACCT team?: No Does patient have Intensive In-House Services?  : No Does patient have [facility] services? : No Does patient have P4CC services?: No  Past Medical History:  Past Medical History:  Diagnosis Date  . Bipolar disorder ([place])   . Depression     Past Surgical History:  Procedure Laterality Date  . ANKLE FRACTURE SURGERY Right   . GASTRIC BYPASS N/A 2007   Family History:  Family History  Problem Relation Age of Onset  . Adopted: Yes   Family Psychiatric  History: No information available right now about any family history as the patient is unable to answer questions about it Social History:  History  Alcohol Use  . Yes     History  Drug Use No    Social History   Social History  . Marital status: Single    Spouse name: N/A  . Number of children: N/A  . Years of education: N/A   Social History Main Topics  . Smoking status: Never Smoker  . Smokeless tobacco: None  . Alcohol use Yes  . Drug use: No  . Sexual activity: Not Asked   Other Topics Concern  . None   Social History Narrative  . None   Additional Social History:     Allergies:  No Known Allergies  Labs:  Results for orders placed or performed during the hospital encounter of 11/09/16 (from the past 48 hour(s))  Glucose, capillary     Status: Abnormal   Collection Time: 11/09/16 10:10 AM  Result Value Ref Range   Glucose-Capillary 104 (H) 65 - 99 mg/dL  Acetaminophen level     Status: Abnormal   Collection Time: 11/09/16 10:26 AM  Result Value Ref Range   Acetaminophen (Tylenol), Serum <10 (L) 10 - 30 ug/mL    Comment:        THERAPEUTIC CONCENTRATIONS VARY SIGNIFICANTLY. A RANGE OF 10-30 ug/mL MAY BE AN EFFECTIVE CONCENTRATION FOR MANY PATIENTS. HOWEVER, SOME ARE BEST TREATED AT CONCENTRATIONS OUTSIDE THIS RANGE. ACETAMINOPHEN CONCENTRATIONS >150 ug/mL AT 4 HOURS AFTER INGESTION AND >50 ug/mL AT 12 HOURS AFTER INGESTION ARE OFTEN ASSOCIATED WITH TOXIC REACTIONS.   Comprehensive metabolic panel     Status: Abnormal   Collection Time: 11/09/16 10:26 AM  Result Value Ref Range   Sodium 140 135 - 145 mmol/L   Potassium 3.6 3.5 - 5.1 mmol/L   Chloride 105 101 - 111 mmol/L  CO2 24 22 - 32 mmol/L   Glucose, Bld 102 (H) 65 - 99 mg/dL   BUN 14 6 - 20 mg/dL   Creatinine, Ser 0.99 0.44 - 1.00 mg/dL   Calcium 9.8 8.9 - 10.3 mg/dL   Total Protein 8.3 (H) 6.5 - 8.1 g/dL   Albumin 4.4 3.5 - 5.0 g/dL   AST 73 (H) 15 - 41 U/L   ALT 50 14 - 54 U/L   Alkaline Phosphatase 104 38 - 126 U/L   Total Bilirubin 1.2 0.3 - 1.2 mg/dL   GFR calc non Af Amer >60 >60 mL/min   GFR calc Af Amer >60 >60 mL/min    Comment: (NOTE) The eGFR has been calculated using the CKD EPI equation. This calculation has not been validated in all clinical situations. eGFR's persistently <60 mL/min signify possible Chronic Kidney Disease.    Anion gap 11 5 - 15  Ethanol     Status: None   Collection Time: 11/09/16 10:26 AM  Result Value Ref Range   Alcohol, Ethyl (B) <5 <5 mg/dL    Comment:        LOWEST DETECTABLE LIMIT FOR SERUM ALCOHOL IS 5 mg/dL FOR MEDICAL  PURPOSES ONLY   Salicylate level     Status: None   Collection Time: 11/09/16 10:26 AM  Result Value Ref Range   Salicylate Lvl <4.1 2.8 - 30.0 mg/dL  CBC with Differential     Status: Abnormal   Collection Time: 11/09/16 10:26 AM  Result Value Ref Range   WBC 7.9 3.6 - 11.0 K/uL   RBC 4.08 3.80 - 5.20 MIL/uL   Hemoglobin 14.1 12.0 - 16.0 g/dL   HCT 42.8 35.0 - 47.0 %   MCV 105.0 (H) 80.0 - 100.0 fL   MCH 34.6 (H) 26.0 - 34.0 pg   MCHC 32.9 32.0 - 36.0 g/dL   RDW 19.3 (H) 11.5 - 14.5 %   Platelets 228 150 - 440 K/uL   Neutrophils Relative % 77 %   Neutro Abs 6.0 1.4 - 6.5 K/uL   Lymphocytes Relative 15 %   Lymphs Abs 1.2 1.0 - 3.6 K/uL   Monocytes Relative 8 %   Monocytes Absolute 0.6 0.2 - 0.9 K/uL   Eosinophils Relative 0 %   Eosinophils Absolute 0.0 0 - 0.7 K/uL   Basophils Relative 0 %   Basophils Absolute 0.0 0 - 0.1 K/uL  Urinalysis complete, with microscopic     Status: Abnormal   Collection Time: 11/09/16 10:26 AM  Result Value Ref Range   Color, Urine AMBER (A) YELLOW   APPearance CLOUDY (A) CLEAR   Glucose, UA NEGATIVE NEGATIVE mg/dL   Bilirubin Urine 1+ (A) NEGATIVE   Ketones, ur 1+ (A) NEGATIVE mg/dL   Specific Gravity, Urine 1.029 1.005 - 1.030   Hgb urine dipstick NEGATIVE NEGATIVE   pH 5.0 5.0 - 8.0   Protein, ur 100 (A) NEGATIVE mg/dL   Nitrite NEGATIVE NEGATIVE   Leukocytes, UA NEGATIVE NEGATIVE   RBC / HPF NONE SEEN 0 - 5 RBC/hpf   WBC, UA 6-30 0 - 5 WBC/hpf   Bacteria, UA RARE (A) NONE SEEN   Squamous Epithelial / LPF 0-5 (A) NONE SEEN   Mucous PRESENT    Hyaline Casts, UA PRESENT    Amorphous Crystal PRESENT   Urine culture     Status: None   Collection Time: 11/09/16 10:26 AM  Result Value Ref Range   Specimen Description URINE, RANDOM    Special  Requests NONE    Culture NO GROWTH Performed at [facility]     Report Status 11/10/2016 FINAL   Urine Drug Screen, Qualitative     Status: None   Collection Time: 11/09/16 10:26 AM   Result Value Ref Range   Tricyclic, Ur Screen NONE DETECTED NONE DETECTED   Amphetamines, Ur Screen NONE DETECTED NONE DETECTED   MDMA (Ecstasy)Ur Screen NONE DETECTED NONE DETECTED   Cocaine Metabolite,Ur [place] NONE DETECTED NONE DETECTED   Opiate, Ur Screen NONE DETECTED NONE DETECTED   Phencyclidine (PCP) Ur S NONE DETECTED NONE DETECTED   Cannabinoid 50 Ng, Ur [place] NONE DETECTED NONE DETECTED   Barbiturates, Ur Screen NONE DETECTED NONE DETECTED   Benzodiazepine, Ur Scrn NONE DETECTED NONE DETECTED   Methadone Scn, Ur NONE DETECTED NONE DETECTED    Comment: (NOTE) 024  Tricyclics, urine               Cutoff 1000 ng/mL 200  Amphetamines, urine             Cutoff 1000 ng/mL 300  MDMA (Ecstasy), urine           Cutoff 500 ng/mL 400  Cocaine Metabolite, urine       Cutoff 300 ng/mL 500  Opiate, urine                   Cutoff 300 ng/mL 600  Phencyclidine (PCP), urine      Cutoff 25 ng/mL 700  Cannabinoid, urine              Cutoff 50 ng/mL 800  Barbiturates, urine             Cutoff 200 ng/mL 900  Benzodiazepine, urine           Cutoff 200 ng/mL 1000 Methadone, urine                Cutoff 300 ng/mL 1100 1200 The urine drug screen provides only a preliminary, unconfirmed 1300 analytical test result and should not be used for non-medical 1400 purposes. Clinical consideration and professional judgment should 1500 be applied to any positive drug screen result due to possible 1600 interfering substances. A more specific alternate chemical method 1700 must be used in order to obtain a confirmed analytical result.  1800 Gas chromato graphy / mass spectrometry (GC/MS) is the preferred 1900 confirmatory method.   Pregnancy, urine     Status: None   Collection Time: 11/09/16 10:26 AM  Result Value Ref Range   Preg Test, Ur NEGATIVE NEGATIVE    Current Facility-Administered Medications  Medication Dose Route Frequency Provider Last Rate Last Dose  . clonazePAM (KLONOPIN) disintegrating  tablet 0.5 mg  0.5 mg Oral BID Gonzella Lex, MD      . lamoTRIgine (LAMICTAL) tablet 25 mg  25 mg Oral Daily Gonzella Lex, MD      . LORazepam (ATIVAN) tablet 1 mg  1 mg Oral Q6H PRN Hinda Kehr, MD      . QUEtiapine (SEROQUEL) tablet 400 mg  400 mg Oral QHS Gonzella Lex, MD      . traZODone (DESYREL) tablet 50 mg  50 mg Oral QHS Hinda Kehr, MD       Current Outpatient Prescriptions  Medication Sig Dispense Refill  . acyclovir (ZOVIRAX) 400 MG tablet Take 400 mg by mouth 5 (five) times daily.    . benzonatate (TESSALON) 100 MG capsule Take 100 mg by mouth 3 (three) times daily as  needed.    . clonazePAM (KLONOPIN) 0.5 MG tablet Take 0.5-1 mg by mouth every 8 (eight) hours as needed for anxiety.    . lamoTRIgine (LAMICTAL) 100 MG tablet Take 100 mg by mouth daily.    . QUEtiapine (SEROQUEL) 400 MG tablet Take 400 mg by mouth at bedtime.    . traZODone (DESYREL) 50 MG tablet Take 50 mg by mouth at bedtime as needed for sleep.      Musculoskeletal: Strength & Muscle Tone: decreased Gait & Station: unable to stand Patient leans: N/A  Psychiatric Specialty Exam: Physical Exam  Nursing note and vitals reviewed. Constitutional: She appears well-developed and well-nourished.  HENT:  Head: Normocephalic and atraumatic.  Eyes: Conjunctivae are normal. Pupils are equal, round, and reactive to light.  Neck: Normal range of motion.  Cardiovascular: Regular rhythm and normal heart sounds.   Respiratory: Effort normal.  GI: Soft.  Musculoskeletal: Normal range of motion.  Neurological: She is alert.  Skin: Skin is warm and dry.  Psychiatric: Her affect is blunt. Her speech is delayed. She is withdrawn. Cognition and memory are impaired.    Review of Systems  Unable to perform ROS: Psychiatric disorder    Blood pressure 136/87, pulse 86, temperature 98.4 F (36.9 C), temperature source Oral, resp. rate 18, height 5' 6"  (1.676 m), weight 83 kg (183 lb), SpO2 97 %.Body mass index is  29.54 kg/m.  General Appearance: Disheveled  Eye Contact:  None  Speech:  Minimal. Only answered to questions during the time we were there area when she did speak however it was clear.  Volume:  Decreased  Mood:  Not able to state this.  Affect:  Blunt  Thought Process:  NA  Orientation:  Negative  Thought Content:  Did not talk enough to allow for a full assessment of whether she's having psychotic symptoms  Suicidal Thoughts:  No  Homicidal Thoughts:  No  Memory:  Negative  Judgement:  Negative  Insight:  Negative  Psychomotor Activity:  Decreased  Concentration:  Concentration: Poor  Recall:  Poor  Fund of Knowledge:  Poor  Language:  Poor  Akathisia:  No  Handed:  Right  AIMS (if indicated):     Assets:  Catering manager Housing Physical Health Resilience Social Support  ADL's:  Impaired  Cognition:  Impaired,  Mild  Sleep:        Treatment Plan Summary: Daily contact with patient to assess and evaluate symptoms and progress in treatment, Medication management and Plan This is a 45 year old woman who presents with acute catatonia. Prior diagnosis of bipolar disorder with depression. Apparently off of medicine. Although she didn't answer very many questions during the interview she did tell me that she would take a pill if we gave it to her. I will go ahead and restart medications with her oral medicine for now. I am told that arrangements have been made for transfer to [facility]. Not sure when that's going to go through. Restart medicines as best I can based on information from her pharmacy. On her vital signs.  Disposition: Recommend psychiatric Inpatient admission when medically cleared. Supportive therapy provided about ongoing stressors.  Alethia Berthold, MD 11/10/2016 12:12 PM

## 2016-11-10 NOTE — ED Provider Notes (Addendum)
-----------------------------------------   12:36 PM on 11/10/2016 -----------------------------------------   Blood pressure 136/87, pulse 86, temperature 98.4 F (36.9 C), temperature source Oral, resp. rate 18, height 5\' 6"  (1.676 m), weight 183 lb (83 kg), SpO2 97 %.  The patient had no acute events since last update. Patient has been laying in bed since this morning has not eaten or drank anything. Patient seems to flutter her eyelids occasionally but really does not otherwise respond. I picked her arm up and let go of it and she just held it in place above her body. I had to put it back down. She did not resist me. Seems almost like a waxy flexibility. We'll call Dr. Mat Carnelay packs and have him advise any further treatment.     Arnaldo NatalPaul F Malinda, MD 11/10/16 1236    Arnaldo NatalPaul F Malinda, MD 11/10/16 1255

## 2016-11-10 NOTE — ED Notes (Signed)
Pt catatonic and unable to transport to [facility]. ER MD assesd pt. Psychiatrist notified and IM meds ordered.  Pt is laying in bed, has not ambulated or sat up today.   Maintained on 15 minute checks and observation by security camera for safety.

## 2016-11-10 NOTE — ED Notes (Signed)
[facility] to be [facility] by [employer] (Pt is IVC). Belongings will be given to officer.

## 2016-11-10 NOTE — ED Provider Notes (Signed)
-----------------------------------------   5:19 AM on 11/10/2016 -----------------------------------------   Blood pressure (!) 154/107, pulse 88, temperature 98.3 F (36.8 C), resp. rate 20, height 5\' 6"  (1.676 m), weight 83 kg, SpO2 99 %.  The patient had no acute events since last update.  Calm and cooperative at this time.  The [facility] at some point after 9:00 AM today   Loleta Roseory Denisse Whitenack, MD 11/10/16 [phone]

## 2018-01-18 ENCOUNTER — Other Ambulatory Visit: Payer: Self-pay | Admitting: Orthopedic Surgery

## 2018-01-18 DIAGNOSIS — M5416 Radiculopathy, lumbar region: Secondary | ICD-10-CM

## 2018-01-19 ENCOUNTER — Encounter: Payer: Self-pay | Admitting: *Deleted

## 2018-01-19 ENCOUNTER — Emergency Department: Payer: BLUE CROSS/BLUE SHIELD

## 2018-01-19 ENCOUNTER — Emergency Department
Admission: EM | Admit: 2018-01-19 | Discharge: 2018-01-20 | Disposition: A | Discharge status: Other Acute Inpatient Hospital | Payer: BLUE CROSS/BLUE SHIELD | Attending: Emergency Medicine | Admitting: Emergency Medicine

## 2018-01-19 DIAGNOSIS — Z79899 Other long term (current) drug therapy: Secondary | ICD-10-CM | POA: Insufficient documentation

## 2018-01-19 DIAGNOSIS — M6281 Muscle weakness (generalized): Secondary | ICD-10-CM | POA: Diagnosis not present

## 2018-01-19 DIAGNOSIS — Z9884 Bariatric surgery status: Secondary | ICD-10-CM | POA: Insufficient documentation

## 2018-01-19 DIAGNOSIS — R202 Paresthesia of skin: Secondary | ICD-10-CM | POA: Diagnosis present

## 2018-01-19 DIAGNOSIS — T1491XA Suicide attempt, initial encounter: Secondary | ICD-10-CM | POA: Insufficient documentation

## 2018-01-19 DIAGNOSIS — F319 Bipolar disorder, unspecified: Secondary | ICD-10-CM | POA: Diagnosis not present

## 2018-01-19 DIAGNOSIS — R29898 Other symptoms and signs involving the musculoskeletal system: Secondary | ICD-10-CM

## 2018-01-19 HISTORY — DX: Obesity, unspecified: E66.9

## 2018-01-19 LAB — URINALYSIS, ROUTINE W REFLEX MICROSCOPIC
Bacteria, UA: NONE SEEN
Bilirubin Urine: NEGATIVE
GLUCOSE, UA: NEGATIVE mg/dL
Hgb urine dipstick: NEGATIVE
Ketones, ur: NEGATIVE mg/dL
Leukocytes, UA: NEGATIVE
Nitrite: NEGATIVE
PH: 6 (ref 5.0–8.0)
Protein, ur: NEGATIVE mg/dL
RBC / HPF: NONE SEEN RBC/hpf (ref 0–5)
SPECIFIC GRAVITY, URINE: 1.013 (ref 1.005–1.030)

## 2018-01-19 LAB — CBC WITH DIFFERENTIAL/PLATELET
BASOS PCT: 0 %
Basophils Absolute: 0 10*3/uL (ref 0–0.1)
EOS ABS: 0.1 10*3/uL (ref 0–0.7)
EOS PCT: 1 %
HCT: 27.6 % — ABNORMAL LOW (ref 35.0–47.0)
HEMOGLOBIN: 9.5 g/dL — AB (ref 12.0–16.0)
LYMPHS ABS: 1.5 10*3/uL (ref 1.0–3.6)
Lymphocytes Relative: 15 %
MCH: 41.9 pg — AB (ref 26.0–34.0)
MCHC: 34.4 g/dL (ref 32.0–36.0)
MCV: 121.9 fL — ABNORMAL HIGH (ref 80.0–100.0)
Monocytes Absolute: 0.5 10*3/uL (ref 0.2–0.9)
Monocytes Relative: 6 %
NEUTROS PCT: 78 %
Neutro Abs: 7.4 10*3/uL — ABNORMAL HIGH (ref 1.4–6.5)
PLATELETS: 331 10*3/uL (ref 150–440)
RBC: 2.26 MIL/uL — AB (ref 3.80–5.20)
RDW: 22.9 % — ABNORMAL HIGH (ref 11.5–14.5)
WBC: 9.5 10*3/uL (ref 3.6–11.0)

## 2018-01-19 LAB — BASIC METABOLIC PANEL
Anion gap: 11 (ref 5–15)
BUN: 9 mg/dL (ref 6–20)
CO2: 27 mmol/L (ref 22–32)
CREATININE: 0.6 mg/dL (ref 0.44–1.00)
Calcium: 8.4 mg/dL — ABNORMAL LOW (ref 8.9–10.3)
Chloride: 100 mmol/L — ABNORMAL LOW (ref 101–111)
Glucose, Bld: 116 mg/dL — ABNORMAL HIGH (ref 65–99)
Potassium: 2.6 mmol/L — CL (ref 3.5–5.1)
Sodium: 138 mmol/L (ref 135–145)

## 2018-01-19 LAB — HCG, QUANTITATIVE, PREGNANCY: hCG, Beta Chain, Quant, S: <1 m[IU]/mL (ref <5)

## 2018-01-19 LAB — POCT PREGNANCY, URINE: Preg Test, Ur: NEGATIVE

## 2018-01-19 MED ORDER — POTASSIUM CHLORIDE 10 MEQ/100ML IV SOLN
10.0000 meq | Freq: Once | INTRAVENOUS | Status: AC
  Administered 2018-01-19: 10 meq via INTRAVENOUS
  Filled 2018-01-19: qty 100

## 2018-01-19 MED ORDER — LIDOCAINE HCL (PF) 1 % IJ SOLN
5.0000 mL | Freq: Once | INTRAMUSCULAR | Status: AC
  Administered 2018-01-19: 5 mL via SUBCUTANEOUS
  Filled 2018-01-19: qty 5

## 2018-01-19 MED ORDER — POTASSIUM CHLORIDE CRYS ER 20 MEQ PO TBCR
40.0000 meq | EXTENDED_RELEASE_TABLET | Freq: Once | ORAL | Status: AC
Start: 2018-01-19 — End: 2018-01-19
  Administered 2018-01-19: 40 meq via ORAL
  Filled 2018-01-19: qty 2

## 2018-01-19 MED ORDER — LORAZEPAM 2 MG/ML IJ SOLN
1.0000 mg | Freq: Once | INTRAMUSCULAR | Status: AC
  Administered 2018-01-19: 1 mg via INTRAVENOUS
  Filled 2018-01-19: qty 1

## 2018-01-19 NOTE — ED Notes (Signed)
Call to lab about HCG, they are processing it now

## 2018-01-19 NOTE — ED Triage Notes (Signed)
[facility] is from home [facility] and numbness in legs, more in left leg. This has been going on for a week.  Pt has been having increasing pain with this. Chronic back problems, had a steroid injection for this last week.  Pt reports increasing difficulty with ambulation at home.  No recent trauma.

## 2018-01-19 NOTE — ED Notes (Signed)
CT called that urine pregnancy is negative, they will come get her shortly

## 2018-01-19 NOTE — ED Provider Notes (Signed)
[facility] Emergency Department Provider Note  ____________________________________________  Time seen: Approximately 7:20 PM  I have reviewed the triage vital signs and the nursing notes.   HISTORY  Chief Complaint Leg Pain (numbness)   HPI Debbie Golden is a 47 y.o. female with h/o bipolar disorder who presents for evaluation of bilateral leg numbness and weakness.Patient reports that 10 days ago she received a steroid shot on her left knee for arthritis. A week ago she developed numbness of bilateral lower extremity that she reports it starts in her proximal thigh and extends all the way to her feet. The numbness has been getting progressively worse and today patient has been unable to walk due to severe numbness. She reports weakness as well bilateral lower extremities. For the last two days she has been complaining of intermittent numbness of her bilateral hands as well. She denies saddle anesthesia, back pain, urinary or bowel incontinence or retention. She denies any personal or family history of neurological or demyelinating disorders. No HA.   Past Medical History:  Diagnosis Date  . Bipolar disorder (HCC)   . Depression   . Obesity     Patient Active Problem List   Diagnosis Date Noted  . Catatonia associated with another mental disorder 11/10/2016  . Noncompliance 11/10/2016  . Involuntary commitment 11/10/2016  . GERD (gastroesophageal reflux disease) 06/25/2015  . Bipolar disorder, curr episode mixed, severe, with psychotic features (HCC) 06/22/2015  . Bipolar disorder (manic depression) (HCC) 06/21/2015  . Hypokalemia 06/21/2015  . Delirium, acute 06/19/2015  . Major depression 06/19/2015  . Alcohol abuse 06/19/2015    Past Surgical History:  Procedure Laterality Date  . ANKLE FRACTURE SURGERY Right   . GASTRIC BYPASS N/A 2007    Prior to Admission medications   Medication Sig Start Date End Date Taking? Authorizing Provider    lamoTRIgine (LAMICTAL) 100 MG tablet Take 100 mg by mouth daily.   Yes [provider]  risperiDONE (RISPERDAL) 0.5 MG tablet Take 0.5 mg by mouth at bedtime.   Yes [provider]  acyclovir (ZOVIRAX) 400 MG tablet Take 400 mg by mouth 5 (five) times daily.    [provider]  benzonatate (TESSALON) 100 MG capsule Take 100 mg by mouth 3 (three) times daily as needed.    [provider]  clonazePAM (KLONOPIN) 0.5 MG tablet Take 0.5-1 mg by mouth every 8 (eight) hours as needed for anxiety.    [provider]  QUEtiapine (SEROQUEL) 400 MG tablet Take 400 mg by mouth at bedtime.    [provider]  traZODone (DESYREL) 50 MG tablet Take 50 mg by mouth at bedtime as needed for sleep.    [provider]    Allergies Patient has no known allergies.  Family History  Adopted: Yes    Social History Social History   Tobacco Use  . Smoking status: Never Smoker  . Smokeless tobacco: Never Used  Substance Use Topics  . Alcohol use: Yes  . Drug use: No    Review of Systems  Constitutional: Negative for fever. Eyes: Negative for visual changes. ENT: Negative for sore throat. Neck: No neck pain  Cardiovascular: Negative for chest pain. Respiratory: Negative for shortness of breath. Gastrointestinal: Negative for abdominal pain, vomiting or diarrhea. Genitourinary: Negative for dysuria. Musculoskeletal: Negative for back pain. Skin: Negative for rash. Neurological: Negative for headaches. + b/l LE weakness and numbness. Psych: No SI or HI  ____________________________________________   PHYSICAL EXAM:  VITAL SIGNS:  ED Triage Vitals  Enc Vitals Group     BP 01/19/18 1641 135/86     Pulse Rate 01/19/18 1641 (!) 108     Resp 01/19/18 1641 16     Temp 01/19/18 1641 98.9 F (37.2 C)     Temp Source 01/19/18 1641 Oral     SpO2 01/19/18 1641 100 %     Weight 01/19/18 1642 224 lb (101.6 kg)     Height --      Head  Circumference --      Peak Flow --      Pain Score 01/19/18 1650 7     Pain Loc --      Pain Edu? --      Excl. in GC? --     Constitutional: Alert and oriented. Well appearing and in no apparent distress. HEENT:      Head: Normocephalic and atraumatic.         Eyes: Conjunctivae are normal. Sclera is non-icteric.       Mouth/Throat: Mucous membranes are moist.       Neck: Supple with no signs of meningismus. Cardiovascular: Regular rate and rhythm. No murmurs, gallops, or rubs. 2+ symmetrical distal pulses are present in all extremities. No JVD. Respiratory: Normal respiratory effort. Lungs are clear to auscultation bilaterally. No wheezes, crackles, or rhonchi.  Gastrointestinal: Soft, non tender, and non distended with positive bowel sounds. No rebound or guarding. Musculoskeletal: Nontender with normal range of motion in all extremities. No edema, cyanosis, or erythema of extremities. No c/t/l spine ttp Neurologic: Normal speech and language. A & O x3, PERRL, EOMI, no nystagmus, CN II-XII intact, motor testing reveals good tone and bulk throughout. There is no evidence of pronator drift. Muscle strength is 5/5 throughout. Deep tendon reflexes are absent on b/l LE. Sensory examination shows no sensation to pain from the sole of her feet to proximal thighs bilaterally. Gait deferred. Normal rectal tone Skin: Skin is warm, dry and intact. No rash noted. Psychiatric: Mood and affect are normal. Speech and behavior are normal.  ____________________________________________   LABS (all labs ordered are listed, but only abnormal results are displayed)  Labs Reviewed  CBC WITH DIFFERENTIAL/PLATELET - Abnormal; Notable for the following components:      Result Value   RBC 2.26 (*)    Hemoglobin 9.5 (*)    HCT 27.6 (*)    MCV 121.9 (*)    MCH 41.9 (*)    RDW 22.9 (*)    Neutro Abs 7.4 (*)    All other components within normal limits  BASIC METABOLIC PANEL - Abnormal; Notable for the  following components:   Potassium 2.6 (*)    Chloride 100 (*)    Glucose, Bld 116 (*)    Calcium 8.4 (*)    All other components within normal limits  URINALYSIS, ROUTINE W REFLEX MICROSCOPIC - Abnormal; Notable for the following components:   Color, Urine AMBER (*)    APPearance HAZY (*)    Squamous Epithelial / LPF 0-5 (*)    All other components within normal limits  HCG, QUANTITATIVE, PREGNANCY  POC URINE PREG, ED  POCT PREGNANCY, URINE   ____________________________________________  EKG  none  ____________________________________________  RADIOLOGY  Head CT: No acute intracranial abnormality.  ____________________________________________   PROCEDURES  Procedure(s) performed: yes .Lumbar Puncture Date/Time: 01/20/2018 12:06 AM Performed by: Nita Sickle, MD Authorized by: Nita Sickle, MD   Consent:    Consent obtained:  Verbal and written  Consent given by:  Patient   Risks discussed:  Headache, bleeding, infection, pain, nerve damage and repeat procedure Pre-procedure details:    Procedure purpose:  Diagnostic   Preparation: Patient was prepped and draped in usual sterile fashion   Anesthesia (see MAR for exact dosages):    Anesthesia method:  Local infiltration   Local anesthetic:  Lidocaine 1% w/o epi Procedure details:    Lumbar space:  L3-L4 interspace   Patient position:  L lateral decubitus   Needle gauge:  22   Needle type:  Spinal needle - Quincke tip   Number of attempts:  2 (unsuccessful) Post-procedure:    Puncture site:  Adhesive bandage applied and direct pressure applied   Patient tolerance of procedure:  Tolerated well, no immediate complications      Critical Care performed:  None ____________________________________________   INITIAL IMPRESSION / ASSESSMENT AND PLAN / ED COURSE  47 y.o. female with h/o bipolar disorder who presents for evaluation of progressively worsening bilateral leg numbness x 1 week and today  unable to walk. Patient has no reflexes on bilateral lower extremities, and no pain sensation from the sole of feet to proximal thigh bilaterally, her motor exam is normal, she has normal rectal tone. No signs or symptoms of cauda equina. At this point I'm concerned for GBS or a demyelinating disorder. Discussed with Dr. Juel BurrowMasoud teleneurologist on called who is also concerned about GBS and recommended head CT and LP.     _________________________ 12:06 AM on 01/20/2018 -----------------------------------------  LP attempted twice unsuccessfully due to body habitus and scoliosis. I spoke with Dr. Julian ReilGardner, hospitalist, Dr. Amada JupiterKirkpatrick, neurologist and Dr. Domenic Morasourtney Ward, ED attending about the patient and she was accepted for ED to ED transfer for further evaluation and treatment of GBS.   As part of my medical decision making, I reviewed the following data within the electronic MEDICAL RECORD NUMBER Nursing notes reviewed and incorporated, Labs reviewed , EKG interpreted , Radiograph reviewed , A consult was requested and obtained from this/these consultant(s) Neurology, Notes from prior ED visits and [place] Controlled Substance Database    Pertinent labs & imaging results that were available during my care of the patient were reviewed by me and considered in my medical decision making (see chart for details).    ____________________________________________   FINAL CLINICAL IMPRESSION(S) / ED DIAGNOSES  Final diagnoses:  Bilateral leg weakness      NEW MEDICATIONS STARTED DURING THIS VISIT:  ED Discharge Orders    None       Note:  This document was prepared using Dragon voice recognition software and may include unintentional dictation errors.    Don PerkingVeronese, [place], MD 01/20/18 [phone]

## 2018-01-19 NOTE — ED Notes (Signed)
Patient transported to CT 

## 2018-01-19 NOTE — ED Notes (Signed)
Tele Neuro has been called per Dr. Arbutus LeasVeronese's orders.

## 2018-01-20 ENCOUNTER — Emergency Department (HOSPITAL_COMMUNITY): Payer: BLUE CROSS/BLUE SHIELD

## 2018-01-20 ENCOUNTER — Other Ambulatory Visit: Payer: Self-pay

## 2018-01-20 ENCOUNTER — Encounter (HOSPITAL_COMMUNITY): Payer: Self-pay | Admitting: Emergency Medicine

## 2018-01-20 ENCOUNTER — Inpatient Hospital Stay (HOSPITAL_COMMUNITY)
Admission: EM | Admit: 2018-01-20 | Discharge: 2018-02-01 | DRG: 094 | Disposition: A | Discharge status: Skilled Nursing Facility | Payer: BLUE CROSS/BLUE SHIELD | Attending: Internal Medicine | Admitting: Internal Medicine

## 2018-01-20 ENCOUNTER — Inpatient Hospital Stay (HOSPITAL_COMMUNITY): Payer: BLUE CROSS/BLUE SHIELD

## 2018-01-20 DIAGNOSIS — G8929 Other chronic pain: Secondary | ICD-10-CM | POA: Diagnosis present

## 2018-01-20 DIAGNOSIS — G4700 Insomnia, unspecified: Secondary | ICD-10-CM | POA: Diagnosis not present

## 2018-01-20 DIAGNOSIS — R200 Anesthesia of skin: Secondary | ICD-10-CM

## 2018-01-20 DIAGNOSIS — K5900 Constipation, unspecified: Secondary | ICD-10-CM | POA: Diagnosis not present

## 2018-01-20 DIAGNOSIS — F10231 Alcohol dependence with withdrawal delirium: Secondary | ICD-10-CM | POA: Diagnosis present

## 2018-01-20 DIAGNOSIS — Z789 Other specified health status: Secondary | ICD-10-CM

## 2018-01-20 DIAGNOSIS — E669 Obesity, unspecified: Secondary | ICD-10-CM | POA: Diagnosis present

## 2018-01-20 DIAGNOSIS — G6189 Other inflammatory polyneuropathies: Secondary | ICD-10-CM | POA: Diagnosis present

## 2018-01-20 DIAGNOSIS — R443 Hallucinations, unspecified: Secondary | ICD-10-CM | POA: Diagnosis present

## 2018-01-20 DIAGNOSIS — E876 Hypokalemia: Secondary | ICD-10-CM | POA: Diagnosis present

## 2018-01-20 DIAGNOSIS — F319 Bipolar disorder, unspecified: Secondary | ICD-10-CM | POA: Diagnosis present

## 2018-01-20 DIAGNOSIS — F10931 Alcohol use, unspecified with withdrawal delirium: Secondary | ICD-10-CM

## 2018-01-20 DIAGNOSIS — M545 Low back pain: Secondary | ICD-10-CM | POA: Diagnosis present

## 2018-01-20 DIAGNOSIS — R29898 Other symptoms and signs involving the musculoskeletal system: Secondary | ICD-10-CM | POA: Diagnosis present

## 2018-01-20 DIAGNOSIS — G9341 Metabolic encephalopathy: Secondary | ICD-10-CM | POA: Diagnosis present

## 2018-01-20 DIAGNOSIS — G9340 Encephalopathy, unspecified: Secondary | ICD-10-CM | POA: Diagnosis not present

## 2018-01-20 DIAGNOSIS — R4182 Altered mental status, unspecified: Secondary | ICD-10-CM

## 2018-01-20 DIAGNOSIS — Z6837 Body mass index (BMI) 37.0-37.9, adult: Secondary | ICD-10-CM

## 2018-01-20 DIAGNOSIS — F10230 Alcohol dependence with withdrawal, uncomplicated: Secondary | ICD-10-CM | POA: Diagnosis not present

## 2018-01-20 DIAGNOSIS — R202 Paresthesia of skin: Secondary | ICD-10-CM | POA: Diagnosis not present

## 2018-01-20 DIAGNOSIS — D539 Nutritional anemia, unspecified: Secondary | ICD-10-CM | POA: Diagnosis present

## 2018-01-20 DIAGNOSIS — G621 Alcoholic polyneuropathy: Secondary | ICD-10-CM | POA: Diagnosis present

## 2018-01-20 DIAGNOSIS — G610 Guillain-Barre syndrome: Principal | ICD-10-CM | POA: Diagnosis present

## 2018-01-20 DIAGNOSIS — Z9884 Bariatric surgery status: Secondary | ICD-10-CM | POA: Diagnosis not present

## 2018-01-20 DIAGNOSIS — R946 Abnormal results of thyroid function studies: Secondary | ICD-10-CM | POA: Diagnosis present

## 2018-01-20 DIAGNOSIS — G378 Other specified demyelinating diseases of central nervous system: Secondary | ICD-10-CM | POA: Diagnosis not present

## 2018-01-20 DIAGNOSIS — Z79899 Other long term (current) drug therapy: Secondary | ICD-10-CM

## 2018-01-20 DIAGNOSIS — Z008 Encounter for other general examination: Secondary | ICD-10-CM | POA: Diagnosis not present

## 2018-01-20 DIAGNOSIS — Z7289 Other problems related to lifestyle: Secondary | ICD-10-CM | POA: Diagnosis present

## 2018-01-20 DIAGNOSIS — F3164 Bipolar disorder, current episode mixed, severe, with psychotic features: Secondary | ICD-10-CM | POA: Diagnosis not present

## 2018-01-20 DIAGNOSIS — R531 Weakness: Secondary | ICD-10-CM | POA: Diagnosis present

## 2018-01-20 HISTORY — DX: Herpesviral infection of urogenital system, unspecified: A60.00

## 2018-01-20 LAB — BASIC METABOLIC PANEL
ANION GAP: 12 (ref 5–15)
Anion gap: 13 (ref 5–15)
BUN: 9 mg/dL (ref 6–20)
BUN: 6 mg/dL (ref 6–20)
CHLORIDE: 105 mmol/L (ref 101–111)
CO2: 23 mmol/L (ref 22–32)
CO2: 20 mmol/L — AB (ref 22–32)
Calcium: 8.1 mg/dL — ABNORMAL LOW (ref 8.9–10.3)
Calcium: 8 mg/dL — ABNORMAL LOW (ref 8.9–10.3)
Chloride: 103 mmol/L (ref 101–111)
Creatinine, Ser: 0.64 mg/dL (ref 0.44–1.00)
Creatinine, Ser: 0.77 mg/dL (ref 0.44–1.00)
GFR calc Af Amer: >60 mL/min (ref >60)
GFR calc non Af Amer: >60 mL/min (ref >60)
GLUCOSE: 101 mg/dL — AB (ref 65–99)
Glucose, Bld: 132 mg/dL — ABNORMAL HIGH (ref 65–99)
POTASSIUM: 2.9 mmol/L — AB (ref 3.5–5.1)
POTASSIUM: 3.5 mmol/L (ref 3.5–5.1)
SODIUM: 138 mmol/L (ref 135–145)
Sodium: 138 mmol/L (ref 135–145)

## 2018-01-20 LAB — FERRITIN: Ferritin: 341 ng/mL — ABNORMAL HIGH (ref 11–307)

## 2018-01-20 LAB — IRON AND TIBC
Iron: 84 ug/dL (ref 28–170)
Saturation Ratios: 55 % — ABNORMAL HIGH (ref 10.4–31.8)
TIBC: 153 ug/dL — ABNORMAL LOW (ref 250–450)
UIBC: 69 ug/dL

## 2018-01-20 LAB — CSF CELL COUNT WITH DIFFERENTIAL
Eosinophils, CSF: 0 % (ref 0–1)
Eosinophils, CSF: 0 % (ref 0–1)
LYMPHS CSF: 15 % — AB (ref 40–80)
Monocyte-Macrophage-Spinal Fluid: 4 % — ABNORMAL LOW (ref 15–45)
RBC COUNT CSF: 13300 /mm3 — AB
RBC Count, CSF: 81 /mm3 — ABNORMAL HIGH
SEGMENTED NEUTROPHILS-CSF: 81 % — AB (ref 0–6)
TUBE #: 1
TUBE #: 4
WBC CSF: 2 /mm3 (ref 0–5)
WBC, CSF: 36 /mm3 (ref 0–5)

## 2018-01-20 LAB — SEDIMENTATION RATE: Sed Rate: 27 mm/hr — ABNORMAL HIGH (ref 0–22)

## 2018-01-20 LAB — GLUCOSE, CSF: GLUCOSE CSF: 57 mg/dL (ref 40–70)

## 2018-01-20 LAB — HCG, QUANTITATIVE, PREGNANCY: HCG, BETA CHAIN, QUANT, S: 1 m[IU]/mL (ref <5)

## 2018-01-20 LAB — RETICULOCYTES
RBC.: 2.11 MIL/uL — AB (ref 3.87–5.11)
RETIC CT PCT: 6.2 % — AB (ref 0.4–3.1)
Retic Count, Absolute: 130.8 10*3/uL (ref 19.0–186.0)

## 2018-01-20 LAB — MRSA PCR SCREENING: MRSA BY PCR: NEGATIVE

## 2018-01-20 LAB — VITAMIN B12: VITAMIN B 12: 401 pg/mL (ref 180–914)

## 2018-01-20 LAB — PATHOLOGIST SMEAR REVIEW

## 2018-01-20 LAB — TSH: TSH: 4.753 u[IU]/mL — AB (ref 0.350–4.500)

## 2018-01-20 LAB — PROTEIN, CSF: TOTAL PROTEIN, CSF: 30 mg/dL (ref 15–45)

## 2018-01-20 LAB — C-REACTIVE PROTEIN: CRP: 2.4 mg/dL — ABNORMAL HIGH (ref <1.0)

## 2018-01-20 LAB — PHOSPHORUS: Phosphorus: 2 mg/dL — ABNORMAL LOW (ref 2.5–4.6)

## 2018-01-20 LAB — FOLATE: Folate: 2.4 ng/mL — ABNORMAL LOW (ref >5.9)

## 2018-01-20 LAB — ETHANOL: Alcohol, Ethyl (B): <10 mg/dL (ref <10)

## 2018-01-20 LAB — HIV ANTIBODY (ROUTINE TESTING W REFLEX): HIV SCREEN 4TH GENERATION: NONREACTIVE

## 2018-01-20 LAB — MAGNESIUM: MAGNESIUM: 1.9 mg/dL (ref 1.7–2.4)

## 2018-01-20 MED ORDER — ASPIRIN EC 325 MG PO TBEC
325.0000 mg | DELAYED_RELEASE_TABLET | Freq: Every day | ORAL | Status: DC
  Administered 2018-01-20 – 2018-02-01 (×13): 325 mg via ORAL
  Filled 2018-01-20 (×13): qty 1

## 2018-01-20 MED ORDER — ACETAMINOPHEN 650 MG RE SUPP: 650.0000 mg | Freq: Four times a day (QID) | RECTAL | Status: DC | PRN

## 2018-01-20 MED ORDER — ADULT MULTIVITAMIN W/MINERALS CH: 1.0000 | ORAL_TABLET | Freq: Every day | ORAL | Status: DC

## 2018-01-20 MED ORDER — POTASSIUM CHLORIDE CRYS ER 20 MEQ PO TBCR
40.0000 meq | EXTENDED_RELEASE_TABLET | Freq: Once | ORAL | Status: AC
  Administered 2018-01-20: 40 meq via ORAL
  Filled 2018-01-20: qty 2

## 2018-01-20 MED ORDER — CYANOCOBALAMIN 1000 MCG/ML IJ SOLN
1000.0000 ug | Freq: Once | INTRAMUSCULAR | Status: AC
  Administered 2018-01-20: 1000 ug via INTRAMUSCULAR
  Filled 2018-01-20 (×2): qty 1

## 2018-01-20 MED ORDER — ACETAMINOPHEN 325 MG PO TABS
650.0000 mg | ORAL_TABLET | Freq: Four times a day (QID) | ORAL | Status: DC | PRN
  Administered 2018-01-26 – 2018-01-31 (×10): 650 mg via ORAL
  Filled 2018-01-20 (×10): qty 2

## 2018-01-20 MED ORDER — POTASSIUM PHOSPHATE MONOBASIC 500 MG PO TABS
1000.0000 mg | ORAL_TABLET | Freq: Once | ORAL | Status: DC
  Filled 2018-01-20: qty 2

## 2018-01-20 MED ORDER — POTASSIUM CHLORIDE 10 MEQ/100ML IV SOLN
10.0000 meq | INTRAVENOUS | Status: AC
  Administered 2018-01-20 (×2): 10 meq via INTRAVENOUS
  Filled 2018-01-20 (×2): qty 100

## 2018-01-20 MED ORDER — GADOBENATE DIMEGLUMINE 529 MG/ML IV SOLN
20.0000 mL | Freq: Once | INTRAVENOUS | Status: AC
  Administered 2018-01-20: 20 mL via INTRAVENOUS

## 2018-01-20 MED ORDER — IMMUNE GLOBULIN (HUMAN) 20 GM/200ML IV SOLN
400.0000 mg/kg | INTRAVENOUS | Status: DC
  Administered 2018-01-20 – 2018-01-21 (×3): 40 g via INTRAVENOUS
  Filled 2018-01-20 (×5): qty 400

## 2018-01-20 MED ORDER — THIAMINE HCL 100 MG/ML IJ SOLN: 100.0000 mg | Freq: Every day | INTRAMUSCULAR | Status: DC

## 2018-01-20 MED ORDER — KCL IN DEXTROSE-NACL 20-5-0.9 MEQ/L-%-% IV SOLN
INTRAVENOUS | Status: DC
  Administered 2018-01-20: 19:00:00 via INTRAVENOUS
  Administered 2018-01-20: 100 mL/h via INTRAVENOUS
  Administered 2018-01-21 – 2018-01-23 (×5): via INTRAVENOUS
  Filled 2018-01-20 (×12): qty 1000

## 2018-01-20 MED ORDER — LORAZEPAM 1 MG PO TABS: 1.0000 mg | ORAL_TABLET | Freq: Four times a day (QID) | ORAL | Status: DC | PRN

## 2018-01-20 MED ORDER — VITAMIN B-1 100 MG PO TABS
100.0000 mg | ORAL_TABLET | Freq: Every day | ORAL | Status: DC
  Administered 2018-01-20 – 2018-02-01 (×13): 100 mg via ORAL
  Filled 2018-01-20 (×13): qty 1

## 2018-01-20 MED ORDER — RISPERIDONE 0.5 MG PO TABS
0.5000 mg | ORAL_TABLET | Freq: Every day | ORAL | Status: DC
  Administered 2018-01-20 – 2018-01-21 (×2): 0.5 mg via ORAL
  Filled 2018-01-20 (×2): qty 1

## 2018-01-20 MED ORDER — FOLIC ACID 1 MG PO TABS
1.0000 mg | ORAL_TABLET | Freq: Every day | ORAL | Status: DC
  Administered 2018-01-20 – 2018-02-01 (×13): 1 mg via ORAL
  Filled 2018-01-20 (×13): qty 1

## 2018-01-20 MED ORDER — LORAZEPAM 2 MG/ML IJ SOLN: 1.0000 mg | Freq: Four times a day (QID) | INTRAMUSCULAR | Status: DC | PRN

## 2018-01-20 MED ORDER — ONDANSETRON HCL 4 MG/2ML IJ SOLN: 4.0000 mg | Freq: Four times a day (QID) | INTRAMUSCULAR | Status: DC | PRN

## 2018-01-20 MED ORDER — ENOXAPARIN SODIUM 40 MG/0.4ML ~~LOC~~ SOLN
40.0000 mg | SUBCUTANEOUS | Status: DC
  Administered 2018-01-20 – 2018-01-31 (×12): 40 mg via SUBCUTANEOUS
  Filled 2018-01-20 (×12): qty 0.4

## 2018-01-20 MED ORDER — FOLIC ACID 5 MG/ML IJ SOLN
1.0000 mg | Freq: Every day | INTRAMUSCULAR | Status: DC
Start: 2018-01-20 — End: 2018-01-20
  Filled 2018-01-20: qty 0.2

## 2018-01-20 MED ORDER — K PHOS MONO-SOD PHOS DI & MONO 155-852-130 MG PO TABS
1000.0000 mg | ORAL_TABLET | Freq: Once | ORAL | Status: AC
  Administered 2018-01-20: 1000 mg via ORAL
  Filled 2018-01-20: qty 4

## 2018-01-20 MED ORDER — ONDANSETRON HCL 4 MG PO TABS: 4.0000 mg | ORAL_TABLET | Freq: Four times a day (QID) | ORAL | Status: DC | PRN

## 2018-01-20 MED ORDER — TRAMADOL HCL 50 MG PO TABS
50.0000 mg | ORAL_TABLET | Freq: Four times a day (QID) | ORAL | Status: DC | PRN
  Administered 2018-01-20 – 2018-01-22 (×2): 50 mg via ORAL
  Filled 2018-01-20 (×2): qty 1

## 2018-01-20 MED ORDER — LAMOTRIGINE 25 MG PO TABS
100.0000 mg | ORAL_TABLET | Freq: Every day | ORAL | Status: DC
  Administered 2018-01-20 – 2018-02-01 (×13): 100 mg via ORAL
  Filled 2018-01-20 (×3): qty 1
  Filled 2018-01-20 (×2): qty 4
  Filled 2018-01-20: qty 1
  Filled 2018-01-20: qty 4
  Filled 2018-01-20: qty 1
  Filled 2018-01-20 (×2): qty 4
  Filled 2018-01-20 (×2): qty 1
  Filled 2018-01-20: qty 4

## 2018-01-20 MED ORDER — KETOROLAC TROMETHAMINE 15 MG/ML IJ SOLN
15.0000 mg | Freq: Once | INTRAMUSCULAR | Status: AC
  Administered 2018-01-20: 15 mg via INTRAVENOUS
  Filled 2018-01-20: qty 1

## 2018-01-20 NOTE — ED Notes (Signed)
Pt returned from MRI at this time

## 2018-01-20 NOTE — Care Management (Signed)
This is a no charge note  Pending admission per Dr. Elesa MassedWard  47 year old lady with past medical history of depression, anxiety, obesity, bipolar disorder, who presents with bilateral lower extremity weakness. Her  weakness been progressively getting worse for the past 3 day. [facility] with concerning for [facility]BS.    CSF is unremarkable.  First tube showed 81 red blood cells and only 2 white blood cells with no organisms.  Fourth tube of CSF did show 133,000 red blood cells with 36 white blood cells, but likely due to traumatic cause.  Radiologist did report that the needle did become dislodged when collecting sample for the third tube. Pt's MRI of C- and T-spin showed no acute abnormality. Neurology, Dr. Amada JupiterKirkpatrick was consulted. Pt is admitted to stepdown bed as inpatient.   Debbie HarpXilin Baeleigh Devincent, MD  [facility] Pager [phone]  If 7PM-7AM, please contact night-coverage [email or web address] Password [email or web address] 01/20/2018, 6:56 AM

## 2018-01-20 NOTE — ED Notes (Signed)
EMTALA checked for completion  

## 2018-01-20 NOTE — ED Notes (Signed)
CSF specimens escorted to main lab by this RN, only 3 tubes collected by Dr.Stevens. Blood noted in tube #3.

## 2018-01-20 NOTE — Procedures (Signed)
Lumbar puncture performed at the L4-5 interspace using fluoroscopic guidance. 5 cc clear CSF was collected in 3 tubes and sent to lab for analysis. During collection for the third tube, the patient moved and the needle came out. Procedure was ended. Fluid in the last tube became blood tingged after the neeedle was dislodged. The patient tolerated the procedure well and there were no complications.

## 2018-01-20 NOTE — ED Notes (Signed)
Report attempted.  Nurse to call back.

## 2018-01-20 NOTE — ED Notes (Signed)
Report was given to [place] RN, charge at [facility].  Report also given to [facility]

## 2018-01-20 NOTE — ED Notes (Signed)
Pt transported to MRI 

## 2018-01-20 NOTE — ED Notes (Signed)
[facility] Will transport pt to 6E when she returns from MRI.

## 2018-01-20 NOTE — ED Notes (Signed)
Dr. Kirkpatrick at bedside 

## 2018-01-20 NOTE — ED Notes (Signed)
ED Provider at bedside. 

## 2018-01-20 NOTE — ED Triage Notes (Signed)
Received report from [facility] RN - pt [facility] for neuro consult d/t weakness/numbness in bilateral legs, more significant on the L side. Pt being worked up for [facility] [place], [facility] attempted LP but were unsuccessful. Pt likely needs fluoro guided LP.

## 2018-01-20 NOTE — ED Notes (Signed)
IV K run burning, see EMAR

## 2018-01-20 NOTE — ED Provider Notes (Signed)
TIME SEEN: 2:06 AM  CHIEF COMPLAINT: Numbness  HPI: Patient is a [age] year old female with history of bipolar disorder who presents to the emergency department with complaints of numbness.  States symptoms started about a week ago.  Started initially in bilateral feet and now has moved up into her mid thigh bilaterally.  She also reports now having some numbness in both of her hands.  She denies any significant neck or back pain.  No headache or head injury.  No facial numbness.  Denies any weakness but because of the numbness of the difficult time walking.  [facility] for further evaluation by our neurologist and possible admission.  Denies fever.  No recent illness.  No recent influenza vaccination.  Patient denies any vision changes, changes in her speech, difficulty breathing, swallowing.  No bowel or bladder incontinence.  No urinary retention.  Her only new medication was a steroid injection in the left knee prior to the symptoms starting.  Denies any pain currently.  No redness, warmth or swelling.   ROS: See HPI Constitutional: no fever  Eyes: no drainage  ENT: no runny nose   Cardiovascular:  no chest pain  Resp: no SOB  GI: no vomiting GU: no dysuria Integumentary: no rash  Allergy: no hives  Musculoskeletal: no leg swelling  Neurological: no slurred speech ROS otherwise negative  PAST MEDICAL HISTORY/PAST SURGICAL HISTORY:  Past Medical History:  Diagnosis Date  . Bipolar disorder (HCC)   . Depression   . Obesity     MEDICATIONS:  Prior to Admission medications   Medication Sig Start Date End Date Taking? Authorizing Provider  acyclovir (ZOVIRAX) 400 MG tablet Take 400 mg by mouth 5 (five) times daily.    [provider]  benzonatate (TESSALON) 100 MG capsule Take 100 mg by mouth 3 (three) times daily as needed.    [provider]  clonazePAM (KLONOPIN) 0.5 MG tablet Take 0.5-1 mg by mouth every 8 (eight) hours as needed for  anxiety.    [provider]  lamoTRIgine (LAMICTAL) 100 MG tablet Take 100 mg by mouth daily.    [provider]  QUEtiapine (SEROQUEL) 400 MG tablet Take 400 mg by mouth at bedtime.    [provider]  risperiDONE (RISPERDAL) 0.5 MG tablet Take 0.5 mg by mouth at bedtime.    [provider]  traZODone (DESYREL) 50 MG tablet Take 50 mg by mouth at bedtime as needed for sleep.    [provider]    ALLERGIES:  No Known Allergies  SOCIAL HISTORY:  Social History   Tobacco Use  . Smoking status: Never Smoker  . Smokeless tobacco: Never Used  Substance Use Topics  . Alcohol use: Yes    FAMILY HISTORY: Family History  Adopted: Yes    EXAM: BP (!) 143/94 (BP Location: Left Arm)   Pulse 100   Temp 98.9 F (37.2 C) (Oral)   Resp 16   SpO2 100%  CONSTITUTIONAL: Alert and oriented and responds appropriately to questions. Well-appearing; well-nourished HEAD: Normocephalic EYES: Conjunctivae clear, pupils appear equal, EOMI ENT: normal nose; moist mucous membranes NECK: Supple, no meningismus, no nuchal rigidity, no LAD  CARD: RRR; S1 and S2 appreciated; no murmurs, no clicks, no rubs, no gallops RESP: Normal chest excursion without splinting or tachypnea; breath sounds clear and equal bilaterally; no wheezes, no rhonchi, no rales, no hypoxia or respiratory distress, speaking full sentences ABD/GI: Normal bowel sounds; non-distended; soft, non-tender, no rebound, no  guarding, no peritoneal signs, no hepatosplenomegaly BACK:  The back appears normal and is non-tender to palpation, there is no CVA tenderness no midline spinal tenderness or step-off or deformity EXT: Normal ROM in all joints; non-tender to palpation; no edema; normal capillary refill; no cyanosis, no calf tenderness or swelling    SKIN: Normal color for age and race; warm; no rash NEURO: Moves all extremities equally, strength 5/5 in all 4 extremities, diminished sensation in  bilateral hands and from the mid thigh down bilaterally.  She has diminished reflexes in all 4 extremities.  No clonus.  Normal sensation in her face.  Cranial nerves II through XII intact.  Normal speech.  PSYCH: The patient's mood and manner are appropriate. Grooming and personal hygiene are appropriate.  MEDICAL DECISION MAKING: Patient here with ascending numbness.  Initial physician was concerned for possible Guillain-Barr or other LMN lesion but doubted cauda equina.  Attempted lumbar puncture x2 without success.  Will likely need LP under fluoroscopy in the morning.  Patient denies any significant neck or back pain.  No bowel or bladder incontinence.  She had a negative CT head at [facility].  She had labs that did show potassium of 2.6 and received oral replacement.  Normal rectal tone and post void residual of 0 mL.  Will discuss with neurology.  Dr. Amada Jupiter with neurology and Dr. Julian Reil with hospitalist service were both aware of this patient as [facility] physcian Dr. Don Perking had spoken with both.   I will recheck her BMP here given potassium of 2.6 at [facility].  ED PROGRESS: 2:20 AM  Dr. Amada Jupiter with neurology has seen patient.  Appreciate his help.  He recommends MRI of the cervical and thoracic spine without contrast.  If no lesions appreciated, recommends medical admission for lumbar puncture and likely IVIG versus PLEX.  Pt will stay NPO at this time.  3:05 AM  D/w Dr. Fidela Juneau with radiology.  Neurology has requested LP to be done urgently as it would significantly change management.  Radiologist agrees to performing LP under fluoroscopy now.  4:00 AM  Pt's potassium level is still low at 2.9.  Millie[place]ivalents of IV replacement and also check a magnesium level.  Her CRP is also slightly elevated.  Patient currently in fluoroscopy getting lumbar puncture with radiology.  Appreciate radiology help.  5:55 AM  CSF is unremarkable.  First tube showed 81 red  blood cells and only 2 white blood cells with no organisms.  Fourth tube of CSF did show 133,000 red blood cells with 36 white blood cells.  Given initial tube was normal I do not feel this is infectious in nature.  Radiologist did report that the needle did become dislodged when collecting sample for the third tube.  CSF in the fourth tube was likely traumatic in nature.  6:30 AM  Pt's MRI showed no acute abnormality.  Discussed with neurology who recommends medicine admission.  Patient does not have a local primary care provider.  6:53 AM Discussed patient's case with hospitalist, Dr. Clyde Lundborg.  I have recommended admission and patient (and family if present) agree with this plan. Admitting physician will place admission orders.   I reviewed all nursing notes, vitals, pertinent previous records, EKGs, lab and urine results, imaging (as available).       Kahli Mayon, Layla Maw, DO 01/20/18 [phone]

## 2018-01-20 NOTE — ED Notes (Signed)
To xray for lumbar puncture at this time

## 2018-01-20 NOTE — Progress Notes (Addendum)
TSH slightly elevated so we will check free T4/T3 in a.m..  Iron normal but folate low at 2.4 so will begin folate replacement.  B12 low normal at 401 so will give a one-time 100 mcg dose of B12 IM.  B6 level pending.  Phosphorus 2.0 so we will give one-time dose of 1000 mg potassium phosphate p.o.  Junious SilkAllison Aeriel Boulay, ANP

## 2018-01-20 NOTE — Consult Note (Signed)
Neurology Consultation Reason for Consult: Lower extremity weakness Referring Physician: Ward, K  CC: Lower extremity weakness  History is obtained from: Patient  HPI: Debbie Golden is a 47 y.o. female with history of bipolar disorder who presents with lower extremity weakness that has been progressively getting worse for the past 3 days.  She relates an incident a month ago where she fell and hit her leg, injuring her knee.  It is been painful and is felt "weak" since that time.  Over the past 3 days, however, she has had progressive weakness of bilateral legs and today she has noticed that her wrists feel weak as well.  She has also noticed numbness which is progressed starting at the feet and ascending.  She denies diplopia, vertigo, shortness of breath, speech changes, difficulty swallowing, antecedent illness.  She did get a cortisone shot last week.  Of note, she has been found to be hypokalemic, but  Exogenous potassium has made no difference on her symptoms.(She was given 10 meq and 40 meq of SA PO)  ROS: A 14 point ROS was performed and is negative except as noted in the HPI.   Past Medical History:  Diagnosis Date  . Bipolar disorder ([place])   . Depression   . Obesity      Family History  Adopted: Yes     Social History:  reports that  has never smoked. she has never used smokeless tobacco. She reports that she drinks alcohol. She reports that she does not use drugs. She reports drinking 2 drinks per day  Exam: Current vital signs: BP (!) 143/94 (BP Location: Left Arm)   Pulse 100   Temp 98.9 F (37.2 C) (Oral)   Resp 16   SpO2 100%  Vital signs in last 24 hours: Temp:  [98.9 F (37.2 C)] 98.9 F (37.2 C) (01/23 0154) Pulse Rate:  [98-118] 100 (01/23 0154) Resp:  [16-25] 16 (01/23 0154) BP: (113-143)/(75-96) 143/94 (01/23 0154) SpO2:  [100 %] 100 % (01/23 0154) Weight:  [101.6 kg (224 lb)] 101.6 kg (224 lb) (01/22 1642)   Physical Exam  Constitutional:  Appears well-developed and well-nourished.  Psych: Affect appropriate to situation Eyes: No scleral injection HENT: No OP obstrucion Head: Normocephalic.  Cardiovascular: Normal rate and regular rhythm.  Respiratory: Effort normal, non-labored breathing GI: Soft.  No distension. There is no tenderness.  Skin: WDI  Neuro: Mental Status: Patient is awake, alert, oriented to person, place, month, year, and situation. Patient is able to give a clear and coherent history. No signs of aphasia or neglect Cranial Nerves: II: Visual Fields are full. Pupils are equal, round, and reactive to light.   III,IV, VI: EOMI without ptosis or diploplia.  V: Facial sensation is symmetric to temperature VII: Facial movement is symmetric.  VIII: hearing is intact to voice X: Uvula elevates symmetrically XI: Shoulder shrug is symmetric. XII: tongue is midline without atrophy or fasciculations.  Motor:     Right  Left Shoulder Abduction  5/5  5/5 Elbow Flexion   5/5  5/5 Elbow Extension  4/5  4/5 Wrist Flexion   4+/5  4+/5 Wrist Extension  4/5  4/5 Thumb abduction  4-/5  4-/5  Hip Flexion   4-/5  4-/5 Knee Flexion   4/5  4/5 Knee Extension  4+/5  4+/5 Ankle Dorsiflexion  4/5  4/5 Ankle Plantarflexion  4+/5  4+/5 Sensory: She has mild reduction to temperature in the distal extremities bilaterally, rather profound reduction to vibration with  absent vibration at the toes and knees bilaterally, mildly reduced at the knuckles Deep Tendon Reflexes: Absent throughout  Cerebellar: FNF intact bilaterally   I have reviewed labs in epic and the results pertinent to this consultation are: BMP-potassium 2.6  I have reviewed the images obtained: CT head-unremarkable  Impression: 47 year old female with progressive ascending weakness and numbness.  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  Lumbar puncture  would also be indicated to assist with diagnosis.  Recommendations: 1) lumbar puncture to evaluate for pleocytosis, protein 2) ESR, CRP 3) MRI cervical spine, T-spine 4) if above workup is unrevealing, I would start disease modifying therapy for Guillain-Barr syndrome 5) frequent nif/vc   Roland Rack, MD [facility] [phone]  If 7pm- 7am, please page neurology on call as listed in [place].

## 2018-01-20 NOTE — H&P (Signed)
History and Physical    Debbie Golden RFF:[id number] DOB: Apr 12, 1971 DOA: 01/20/2018  **Will admit patient based on the expectation that the patient will need hospitalization/ hospital care that crosses at least 2 midnights  PCP: Patient, No Pcp Per Althia Forts  Attending physician: Jamse Arn  Patient coming from/Resides with: Private residence  Chief Complaint: Lower extremity weakness  HPI: Debbie Golden is a 47 y.o. female with medical history significant for bipolar disorder with history of catatonia in 2017, obesity, macrocytic anemia, history of genital herpes, and regular alcohol use.  Patient presented to [facility] on the afternoon of 1/22 reporting pain and numbness in both legs greater in the left leg ongoing for a week.  She reports chronic back problems and had a steroid injection 1 week prior.  She reported increased difficulty with ambulation at home and no recent trauma.  When I questioned the patient she reports she has been having difficulty ambulating and weakness/numbness/tingling in the legs waxing and waning for 1 month.  CT of the head was unremarkable.  Due to concerns of possible Guillain- Barr patient was subsequently transferred to this facility so she can undergo a lumbar puncture.  After arrival to [facility] she was evaluated by neurology who documented on exam patient had absent generalized DTRs, mild bilateral lower extremity motor weakness averaging 4/5 from the hips down with decreased wrist flexion, extension, abduction bilaterally at 4/5 as well.  She was not exhibiting any respiratory symptoms or signs of bowel or bladder incontinence or urinary retention.  MRI of the cervical and thoracic spine did not reveal explanation for patient's current symptoms.  MR lumbar spine pending.  Subsequent review of CSF is not consistent with Guillian-Barr.  She will be admitted to the stepdown unit for further monitoring and treatment.  ED Course:  Vital Signs: BP 132/79   Pulse (!)  120   Temp 98.9 F (37.2 C) (Oral)   Resp (!) 28   SpO2 100%  CT head: No acute infarction, hemorrhage, hydrocephalus or mass lesion MR cervical spine/thoracic spine: Normal appearance of cervical and thoracic spinal cord, mild cervical spondylolysis C4-5 through C6-7 without significant spinal stenosis; minimal disc bulging T7-8 without stenosis MR lumbar spine: Ordered but not yet completed Lab data: Sodium 138, potassium 2.6, chloride 100, CO2 27, glucose 116, BUN 9, creatinine 0.6, calcium 8.4, anion gap 11, CRP 2.4, white count 9500 with neutrophils 78% and absolute neutrophils 7.4%, hemoglobin 9.5 with MCV 122, urine pregnancy negative, ESR 27, urinalysis unremarkable, CSF/traumatic tap: Hazy appearance with 133,000 RBCs, 81% segmented neutrophils, 15% lymphocytes, 4% monocytes, pink color, WBCs 36, total protein 30-CSF culture pending Medications and treatments: Potassium chloride 10 mEq x3  Review of Systems:  In addition to the HPI above,  No Fever-chills, myalgias or other constitutional symptoms No Headache, changes with Vision or hearing, new weakness, tingling, numbness in any extremity, dizziness, dysarthria or word finding difficulty, gait disturbance or imbalance, tremors or seizure activity No problems swallowing food or Liquids, indigestion/reflux, choking or coughing while eating, abdominal pain with or after eating No Chest pain, Cough or Shortness of Breath, palpitations, orthopnea or DOE No Abdominal pain, N/V, melena,hematochezia, dark tarry stools, constipation No dysuria, malodorous urine, hematuria or flank pain No new skin rashes, lesions, masses or bruises, No new joint pains, aches, swelling or redness No recent unintentional weight gain or loss No polyuria, polydypsia or polyphagia   Past Medical History:  Diagnosis Date  . Bipolar disorder ([place])   . Depression   .  Genital herpes   . Obesity     Past Surgical History:  Procedure Laterality Date  .  ANKLE FRACTURE SURGERY Right   . GASTRIC BYPASS N/A 2007    Social History   Socioeconomic History  . Marital status: Single    Spouse name: Not on file  . Number of children: Not on file  . Years of education: Not on file  . Highest education level: Not on file  Social Needs  . Financial resource strain: Not on file  . Food insecurity - worry: Not on file  . Food insecurity - inability: Not on file  . Transportation needs - medical: Not on file  . Transportation needs - non-medical: Not on file  Occupational History  . Not on file  Tobacco Use  . Smoking status: Never Smoker  . Smokeless tobacco: Never Used  Substance and Sexual Activity  . Alcohol use: Yes  . Drug use: No  . Sexual activity: Not on file  Other Topics Concern  . Not on file  Social History Narrative  . Not on file    Mobility: Independent Work history: Not obtained   No Known Allergies  Family History  Adopted: Yes     Prior to Admission medications   Medication Sig Start Date End Date Taking? Authorizing Provider  lamoTRIgine (LAMICTAL) 100 MG tablet Take 100 mg by mouth daily.    [provider]  risperiDONE (RISPERDAL) 0.5 MG tablet Take 0.5 mg by mouth at bedtime.    [provider]    Physical Exam: Vitals:   01/20/18 0245 01/20/18 0400 01/20/18 0616 01/20/18 0700  BP: (!) 130/92 124/73 (!) 134/94 132/79  Pulse: (!) 109 (!) 114 (!) 110 (!) 120  Resp: (!) 24 (!) 27 20 (!) 28  Temp:      TempSrc:      SpO2: 99% 99% 99% 100%      Constitutional: NAD, calm, comfortable Eyes: PERRL, lids and conjunctivae normal ENMT: Mucous membranes are moist. Posterior pharynx clear of any exudate or lesions.Normal dentition.  Neck: normal, supple, no masses, no thyromegaly Respiratory: clear to auscultation bilaterally, no wheezing, no crackles. Normal respiratory effort. No accessory muscle use.  Cardiovascular: Regular rate and rhythm, no murmurs / rubs / gallops. No  extremity edema. 2+ pedal pulses. No carotid bruits.  Abdomen: Soft, no tenderness, no masses palpated. No hepatosplenomegaly. Bowel sounds positive.  Musculoskeletal: no clubbing / cyanosis. No joint deformity upper and lower extremities. Good ROM, no contractures. Normal muscle tone.  Skin: no rashes, lesions, ulcers. No induration-several areas of bruising just above the umbilicus in a bandlike pattern-(she reports she does not know how she developed the bruises and denies trauma) Neurologic: CN 2-12 grossly intact. Sensation intact except as follows: Difficulty differentiating between sharp and dull from mid forearm to fingertips bilaterally; inability to differentiate between sharp and dull from 4 inches above the umbilicus to toes-at the same areas of sensation abnormality she was able to differentiate between cold and not cold, DTR absent. Strength 5/5 in regards to bilateral shoulder shrug, proximal arms primarily wrist and hand strength is 4/5, noted to be able to lift legs off the bed independently and hold but strength especially distal strength primarily 4/5 Psychiatric: Normal judgment and insight. Alert and oriented x 3. Normal mood.    Labs on Admission: I have personally reviewed following labs and imaging studies  CBC: Recent Labs  Lab 01/19/18 1924  WBC 9.5  NEUTROABS 7.4*  HGB 9.5*  HCT 27.6*  MCV 121.9*  PLT 945   Basic Metabolic Panel: Recent Labs  Lab 01/19/18 1924 01/20/18 0218  NA 138 138  K 2.6* 2.9*  CL 100* 103  CO2 27 23  GLUCOSE 116* 101*  BUN 9 9  CREATININE 0.60 0.64  CALCIUM 8.4* 8.1*  MG  --  1.9   GFR: CrCl cannot be calculated (Unknown ideal weight.). Liver Function Tests: No results for input(s): AST, ALT, ALKPHOS, BILITOT, PROT, ALBUMIN in the last 168 hours. No results for input(s): LIPASE, AMYLASE in the last 168 hours. No results for input(s): AMMONIA in the last 168 hours. Coagulation Profile: No results for input(s): INR, PROTIME in  the last 168 hours. Cardiac Enzymes: No results for input(s): CKTOTAL, CKMB, CKMBINDEX, TROPONINI in the last 168 hours. BNP (last 3 results) No results for input(s): PROBNP in the last 8760 hours. HbA1C: No results for input(s): HGBA1C in the last 72 hours. CBG: No results for input(s): GLUCAP in the last 168 hours. Lipid Profile: No results for input(s): CHOL, HDL, LDLCALC, TRIG, CHOLHDL, LDLDIRECT in the last 72 hours. Thyroid Function Tests: No results for input(s): TSH, T4TOTAL, FREET4, T3FREE, THYROIDAB in the last 72 hours. Anemia Panel: No results for input(s): VITAMINB12, FOLATE, FERRITIN, TIBC, IRON, RETICCTPCT in the last 72 hours. Urine analysis:    Component Value Date/Time   COLORURINE AMBER (A) 01/19/2018 2058   APPEARANCEUR HAZY (A) 01/19/2018 2058   LABSPEC 1.013 01/19/2018 2058   PHURINE 6.0 01/19/2018 2058   GLUCOSEU NEGATIVE 01/19/2018 2058   HGBUR NEGATIVE 01/19/2018 2058   BILIRUBINUR NEGATIVE 01/19/2018 2058   KETONESUR NEGATIVE 01/19/2018 2058   PROTEINUR NEGATIVE 01/19/2018 2058   NITRITE NEGATIVE 01/19/2018 2058   LEUKOCYTESUR NEGATIVE 01/19/2018 2058   Sepsis Labs: @LABRCNTIP (procalcitonin:4,lacticidven:4) ) Recent Results (from the past 240 hour(s))  CSF culture     Status: None (Preliminary result)   Collection Time: 01/20/18  3:55 AM  Result Value Ref Range Status   Specimen Description CSF  Final   Special Requests NONE  Final   Gram Stain   Final    WBC PRESENT, PREDOMINANTLY MONONUCLEAR NO ORGANISMS SEEN CYTOSPIN SMEAR    Culture PENDING  Incomplete   Report Status PENDING  Incomplete     Radiological Exams on Admission: Ct Head Wo Contrast  Result Date: 01/19/2018 CLINICAL DATA:  Pain and numbness in the legs. Symptoms have been going on for a week. EXAM: CT HEAD WITHOUT CONTRAST TECHNIQUE: Contiguous axial images were obtained from the base of the skull through the vertex without intravenous contrast. COMPARISON:  11/09/2016  FINDINGS: Brain: No evidence of acute infarction, hemorrhage, hydrocephalus, extra-axial collection or mass lesion/mass effect. Vascular: No hyperdense vessel or unexpected calcification. Skull: Normal. Negative for fracture or focal lesion. Sinuses/Orbits: Mucosal disease in the right sphenoid sinus. Other: None. IMPRESSION: No acute intracranial abnormality. Electronically Signed   By: Markus Daft M.D.   On: 01/19/2018 21:54   Mr Cervical Spine Wo Contrast  Result Date: 01/20/2018 CLINICAL DATA:  Initial evaluation for progressive ascending weakness and numbness. EXAM: MRI CERVICAL, THORACIC SPINE WITHOUT CONTRAST TECHNIQUE: Multiplanar and multiecho pulse sequences of the cervical spine, to include the craniocervical junction and cervicothoracic junction, and thoracic and lumbar spine, were obtained without intravenous contrast. COMPARISON:  None available. FINDINGS: MRI CERVICAL SPINE FINDINGS Alignment: Study moderately degraded by motion artifact. Straightening with slight reversal of the normal cervical lordosis, apex at C5. No listhesis. Vertebrae: Vertebral body heights are maintained without evidence for  acute or chronic fracture. Bone marrow signal intensity within normal limits. No discrete or worrisome osseous lesions. No abnormal marrow edema. Cord: Signal intensity within cervical spinal cord within normal limits. No appreciable cord signal abnormality identified on this motion degraded exam. Posterior Fossa, vertebral arteries, paraspinal tissues: Visualized brain within normal limits. Craniocervical junction normal. Scattered mucosal thickening noted with in the visualized paranasal sinuses. Paraspinous and prevertebral soft tissues within normal limits. Normal intravascular flow voids present within the vertebral arteries bilaterally. Disc levels: C2-C3: Unremarkable. C3-C4:  Unremarkable. C4-C5: Mild diffuse disc bulge with bilateral uncovertebral hypertrophy. Resultant mild right C5 foraminal  stenosis. No significant canal narrowing. C5-C6: Chronic diffuse degenerative disc osteophyte with intervertebral disc space narrowing. Broad posterior component mildly flattens the ventral thecal sac without significant spinal stenosis. Moderate bilateral C6 foraminal stenosis, slightly worse on the right. C6-C7: Mild diffuse chronic disc osteophyte with intervertebral disc space narrowing. Flattening of the ventral thecal sac without significant spinal stenosis. Mild bilateral C7 foraminal stenosis. C7-T1: Mild facet hypertrophy. Otherwise unremarkable without stenosis. MRI THORACIC SPINE FINDINGS Alignment: Vertebral bodies normally aligned with preservation of the normal thoracic kyphosis. No listhesis. Vertebrae: Vertebral body heights are maintained without evidence for acute or chronic fracture. Bone marrow signal intensity within normal limits. No discrete or worrisome osseous lesions. No abnormal marrow edema. Cord: Signal intensity within the thoracic spinal cord within normal limits. No cord signal abnormality seen on this motion degraded study. Conus medullaris terminates at the L1 level. Overall cord caliber normal. Paraspinal and other soft tissues: Paraspinous soft tissues within normal limits. Partially visualized lungs are grossly clear. Visualized visceral structures are unremarkable. Disc levels: T7-8: Mild disc bulge without significant stenosis. L1-2: Seen only on sagittal projection. Disc bulge without significant canal stenosis. No foraminal encroachment. No other significant degenerative changes seen within the thoracic spine. No significant canal or neural foraminal stenosis. IMPRESSION: MRI CERVICAL SPINE IMPRESSION: 1. Normal MRI appearance of the cervical spinal cord. 2. 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 Mild to moderate bilateral foraminal narrowing at these levels as above. MRI THORACIC SPINE IMPRESSION: 1. Normal MRI appearance of the  thoracic spinal cord. 2. Minimal disc bulging at T7-8 without stenosis. No other significant degenerative changes within the thoracic spine. No significant stenosis. Electronically Signed   By: Jeannine Boga M.D.   On: 01/20/2018 06:42   Mr Thoracic Spine Wo Contrast  Result Date: 01/20/2018 CLINICAL DATA:  Initial evaluation for progressive ascending weakness and numbness. EXAM: MRI CERVICAL, THORACIC SPINE WITHOUT CONTRAST TECHNIQUE: Multiplanar and multiecho pulse sequences of the cervical spine, to include the craniocervical junction and cervicothoracic junction, and thoracic and lumbar spine, were obtained without intravenous contrast. COMPARISON:  None available. FINDINGS: MRI CERVICAL SPINE FINDINGS Alignment: Study moderately degraded by motion artifact. Straightening with slight reversal of the normal cervical lordosis, apex at C5. No listhesis. Vertebrae: Vertebral body heights are maintained without evidence for acute or chronic fracture. Bone marrow signal intensity within normal limits. No discrete or worrisome osseous lesions. No abnormal marrow edema. Cord: Signal intensity within cervical spinal cord within normal limits. No appreciable cord signal abnormality identified on this motion degraded exam. Posterior Fossa, vertebral arteries, paraspinal tissues: Visualized brain within normal limits. Craniocervical junction normal. Scattered mucosal thickening noted with in the visualized paranasal sinuses. Paraspinous and prevertebral soft tissues within normal limits. Normal intravascular flow voids present within the vertebral arteries bilaterally. Disc levels: C2-C3: Unremarkable. C3-C4:  Unremarkable. C4-C5: Mild diffuse disc bulge with  bilateral uncovertebral hypertrophy. Resultant mild right C5 foraminal stenosis. No significant canal narrowing. C5-C6: Chronic diffuse degenerative disc osteophyte with intervertebral disc space narrowing. Broad posterior component mildly flattens the  ventral thecal sac without significant spinal stenosis. Moderate bilateral C6 foraminal stenosis, slightly worse on the right. C6-C7: Mild diffuse chronic disc osteophyte with intervertebral disc space narrowing. Flattening of the ventral thecal sac without significant spinal stenosis. Mild bilateral C7 foraminal stenosis. C7-T1: Mild facet hypertrophy. Otherwise unremarkable without stenosis. MRI THORACIC SPINE FINDINGS Alignment: Vertebral bodies normally aligned with preservation of the normal thoracic kyphosis. No listhesis. Vertebrae: Vertebral body heights are maintained without evidence for acute or chronic fracture. Bone marrow signal intensity within normal limits. No discrete or worrisome osseous lesions. No abnormal marrow edema. Cord: Signal intensity within the thoracic spinal cord within normal limits. No cord signal abnormality seen on this motion degraded study. Conus medullaris terminates at the L1 level. Overall cord caliber normal. Paraspinal and other soft tissues: Paraspinous soft tissues within normal limits. Partially visualized lungs are grossly clear. Visualized visceral structures are unremarkable. Disc levels: T7-8: Mild disc bulge without significant stenosis. L1-2: Seen only on sagittal projection. Disc bulge without significant canal stenosis. No foraminal encroachment. No other significant degenerative changes seen within the thoracic spine. No significant canal or neural foraminal stenosis. IMPRESSION: MRI CERVICAL SPINE IMPRESSION: 1. Normal MRI appearance of the cervical spinal cord. 2. 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 Mild to moderate bilateral foraminal narrowing at these levels as above. MRI THORACIC SPINE IMPRESSION: 1. Normal MRI appearance of the thoracic spinal cord. 2. Minimal disc bulging at T7-8 without stenosis. No other significant degenerative changes within the thoracic spine. No significant stenosis. Electronically  Signed   By: Jeannine Boga M.D.   On: 01/20/2018 06:42   Dg Fluoro Guide Lumbar Puncture  Result Date: 01/20/2018 CLINICAL DATA:  Numbness in the legs and arms. EXAM: DIAGNOSTIC LUMBAR PUNCTURE UNDER FLUOROSCOPIC GUIDANCE FLUOROSCOPY TIME:  Fluoroscopy Time:  1 minute 0 seconds. Radiation Exposure Index (if provided by the fluoroscopic device): Dose area product is 270 micro Gy meter square. Reference air kerma is 21 mGy Number of Acquired Spot Images: 1 PROCEDURE: Informed consent was obtained from the patient prior to the procedure, including potential complications of headache, allergy, and pain. With the patient prone, the lower back was prepped with Betadine. 1% Lidocaine was used for local anesthesia. Lumbar puncture was performed at the L4-5 level using a 20 gauge needle with return of clear CSF. 5 ml of CSF were obtained for laboratory studies. During collection for the third tube, the patient moved, dislodging the needle and the procedure was ended. The last of the fluid became blood-tinged after the needle was dislodged. Opening pressure was not obtained as the patient was unable to tolerate positioning for opening pressure. Subjectively, the pressure was not elevated. The patient tolerated the procedure well and there were no apparent complications. Patient complained of loss of feeling in her legs both before and after the procedure without significant change. IMPRESSION: Lumbar puncture performed under fluoroscopy at the L4-5 level. 5 mL clear CSF was obtained. Patient tolerated the procedure well without complication. Electronically Signed   By: Lucienne Capers M.D.   On: 01/20/2018 04:21     Assessment/Plan Principal Problem:   Leg weakness, bilateral/distal upper extremity weakness -Patient presents with reports of worsening lower extremity weakness and difficulty ambulating associated with bilateral foot pain noting lower extremity symptoms waxing and waning for  1 month and upper  extremity symptoms ongoing ?  1-2 weeks (patient stated upper extremity symptoms 1 month to me but 1-2 weeks to attending physician-she also reported to attending physician gait instability for greater than 6 months) -Neurological exam confirms symptoms as reported by patient -Patient reports recently started on Tylenol and tramadol for bilateral foot pain as an outpatient without improvement in symptoms -Current imaging does not explain patient's symptoms-MR lumbar spine pending -CSF, history clinical exam/features not consistent with Guillain- Barr and less likely consistent with infectious meningitis-defer to neurology as to whether definitive treatment for Guillain- Barr indicated -ESR slightly elevated with CRP being somewhat more elevated -Neurology following -Continue NIF and monitor for decreases -Frequent neurological checks -Follow-up on MR lumbar spine -PT/OT evaluation -Patient currently reporting foot pain so will give one-time dose of Toradol (cannot use NSAIDs regularly in the context of history of gastric bypass surgery); will also utilize Tylenol and Ultram prn-avoid narcotics and other sedating medications  Active Problems:   Acute hypokalemia -Unclear etiology-?  Contributory to above weakness/neuropathy symptoms -Replace potassium and follow labs -Magnesium 1.9-obtain phosphorus level    Macrocytic anemia -Current hemoglobin around 9.5 -Previous hemoglobin ranging between 9.5 and 10.4 with slowly rising MCV over the past 2 years from 86 to current of 122 -Obtain anemia panel to include B12-check B6 level especially with associated neuropathy symptoms -TSH    Alcohol use -Patient inconsistent and in regards to her alcohol use noting Neuro and EDP obtained a history of 2 drinks daily, she reported to me 2 drinks every other day and then once again reported to my attending physician 2 drinks a day -No prior history of alcohol withdrawal therefore no indication to  initiate CIWA especially in a patient who presents with abnormal neurological findings    Obesity -History of gastric bypass procedure -Could have malabsorptive issues contributing to vitamin deficiencies that therefore could lead to neuropathy    Bipolar disorder  -Continue preadmission Lamictal and Risperdal      DVT prophylaxis: Lovenox Code Status: Full Family Communication: No family at bedside Disposition Plan: Home Consults called: Neurology/K[place]patrick    Harpreet Signore L. ANP-BC [facility] Pager [phone]   If 7PM-7AM, please contact night-coverage [email or web address] Password [facility]  01/20/2018, 7:56 AM

## 2018-01-20 NOTE — ED Notes (Signed)
Date and time results received: 01/20/18 0542  (use smartphrase ".now" to insert current time)  Test: WBC in CSF Critical Value: [id number]  Name of Provider Notified: Dr. Elesa MassedWard  Orders Received? Or Actions Taken?: no new orders

## 2018-01-20 NOTE — ED Notes (Signed)
Patient transported to MRI at this time. Peripheral IV saline locked until she returns.

## 2018-01-20 NOTE — ED Notes (Signed)
Updated pt on NPO status, pt verbalized understanding

## 2018-01-20 NOTE — Progress Notes (Signed)
Neurology same day progress note  Exam unchanged after repeating potassium. Spinal tap revealed normal CSF. No C-spine pathology at this time. No enhancement seen on L-spine MRI. Clinical suspicion for Guillain-Barr syndrome. 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 Definitive diagnosis could be with EMG nerve conduction studies that need to be done as an outpatient.  Recommended For now we will start 5 days of IVIg Patient should be on aspirin while on IVIG as well as should be kept well-hydrated. Replete electrolytes as needed.  -- Milon DikesAshish Karanvir Balderston, MD [facility] Pager: [phone] If 7pm to 7am, please call on call as listed on AMION.

## 2018-01-21 DIAGNOSIS — E669 Obesity, unspecified: Secondary | ICD-10-CM

## 2018-01-21 DIAGNOSIS — F319 Bipolar disorder, unspecified: Secondary | ICD-10-CM

## 2018-01-21 LAB — RAPID URINE DRUG SCREEN, HOSP PERFORMED
AMPHETAMINES: NOT DETECTED
BENZODIAZEPINES: POSITIVE — AB
Barbiturates: NOT DETECTED
Cocaine: NOT DETECTED
OPIATES: NOT DETECTED
TETRAHYDROCANNABINOL: NOT DETECTED

## 2018-01-21 LAB — COMPREHENSIVE METABOLIC PANEL
ALK PHOS: 190 U/L — AB (ref 38–126)
ALT: 44 U/L (ref 14–54)
ANION GAP: 10 (ref 5–15)
AST: 125 U/L — ABNORMAL HIGH (ref 15–41)
Albumin: 2.4 g/dL — ABNORMAL LOW (ref 3.5–5.0)
BUN: 6 mg/dL (ref 6–20)
CALCIUM: 7.7 mg/dL — AB (ref 8.9–10.3)
CO2: 21 mmol/L — AB (ref 22–32)
CREATININE: 0.67 mg/dL (ref 0.44–1.00)
Chloride: 107 mmol/L (ref 101–111)
Glucose, Bld: 97 mg/dL (ref 65–99)
Potassium: 3.7 mmol/L (ref 3.5–5.1)
SODIUM: 138 mmol/L (ref 135–145)
TOTAL PROTEIN: 6.6 g/dL (ref 6.5–8.1)
Total Bilirubin: 3 mg/dL — ABNORMAL HIGH (ref 0.3–1.2)

## 2018-01-21 LAB — CBC
HCT: 24.7 % — ABNORMAL LOW (ref 36.0–46.0)
HEMOGLOBIN: 8.2 g/dL — AB (ref 12.0–15.0)
MCH: 40.8 pg — ABNORMAL HIGH (ref 26.0–34.0)
MCHC: 33.2 g/dL (ref 30.0–36.0)
MCV: 122.9 fL — ABNORMAL HIGH (ref 78.0–100.0)
PLATELETS: 279 10*3/uL (ref 150–400)
RBC: 2.01 MIL/uL — AB (ref 3.87–5.11)
RDW: 21.6 % — ABNORMAL HIGH (ref 11.5–15.5)
WBC: 7.2 10*3/uL (ref 4.0–10.5)

## 2018-01-21 LAB — PHOSPHORUS: PHOSPHORUS: 2.2 mg/dL — AB (ref 2.5–4.6)

## 2018-01-21 LAB — T4, FREE: Free T4: 1.15 ng/dL — ABNORMAL HIGH (ref 0.61–1.12)

## 2018-01-21 NOTE — Evaluation (Signed)
Physical Therapy Evaluation Patient Details Name: Debbie Golden MRN: [id number] DOB: 11/08/1971 Today's Date: 01/21/2018   History of Present Illness  47 y.o.femalewith medical history significant forbipolar disorder with history of catatonia in 2017, obesity, macrocytic anemia, history of genital herpes, and regular alcohol use presenting with reports of pain and numbness in both legs for hte past week. MRI of cervical and thoracic spine did not reveal explanation for pts symptoms. MRI lumbar spine pending. Review of CSF not consistent with Guillian-Barr. Pt started on IVIG on 1/23.  Clinical Impression  Pt admitted with/for the above complications including pain and numbness in both legs.  Pt presently needing max of 2 for mobility due to anxieties, weakness and incoordination.  Pt currently limited functionally due to the problems listed. ( See problems list.)   Pt will benefit from PT to maximize function and safety in order to get ready for next venue listed below.     Follow Up Recommendations SNF;Supervision/Assistance - 24 hour    Equipment Recommendations  None recommended by PT(TBA next venue)    Recommendations for Other Services       Precautions / Restrictions Precautions Precautions: Fall Restrictions Weight Bearing Restrictions: No      Mobility  Bed Mobility Overal bed mobility: Needs Assistance Bed Mobility: Supine to Sit     Supine to sit: Max assist;+2 for safety/equipment     General bed mobility comments: Pt able to come into long sitting in bed with min guard and begin bringing LEs over to EOB. Pt appears to get anxious with movement and required max assist to square hips up to EOB and get feet flat on floor.  Transfers Overall transfer level: Needs assistance Equipment used: 2 person hand held assist Transfers: Sit to/from [employer] Sit to Stand: Max assist;+2 physical assistance;+2 safety/equipment Stand pivot transfers: Max  assist;+2 physical assistance;+2 safety/equipment       General transfer comment: Max assist to boost up from EOB with use of bed pad. Cues thorughout for sequencing and safety. Pt throwing herself posteriorly prior to initial sit to stand from EOB. Inconsistent bil knee buckling noted but then pt with strength to stand at times too.  Ambulation/Gait             General Gait Details: unable,  NT  Stairs            Wheelchair Mobility    Modified Rankin (Stroke Patients Only)       Balance Overall balance assessment: Needs assistance Sitting-balance support: Feet supported;Bilateral upper extremity supported Sitting balance-Leahy Scale: Poor Sitting balance - Comments: max to min guard assist   Standing balance support: Bilateral upper extremity supported Standing balance-Leahy Scale: Poor                               Pertinent Vitals/Pain Pain Assessment: Faces Faces Pain Scale: Hurts little more Pain Location: bil knees Pain Descriptors / Indicators: Aching Pain Intervention(s): Monitored during session    Home Living Family/patient expects to be discharged to:: Private residence Living Arrangements: Alone Available Help at Discharge: Family;Available PRN/intermittently Type of Home: House(townhome) Home Access: Stairs to enter   Entrance Stairs-Number of Steps: 8 Home Layout: Two level;Bed/bath upstairs Home Equipment: Cane - single point;Crutches;Bedside commode      Prior Function Level of Independence: Independent         Comments: driving, working in medical transcription     Hand Dominance  Dominant Hand: Right    Extremity/Trunk Assessment   Upper Extremity Assessment Upper Extremity Assessment: Generalized weakness;RUE deficits/detail;LUE deficits/detail RUE Deficits / Details: Inconsistent functionally. Pt able to move against gravity with good grip strength for functional tasks. Appears to have coordination deficits  at times. Reporting decreased sensation distally. RUE Sensation: decreased light touch RUE Coordination: decreased fine motor;decreased gross motor LUE Deficits / Details: Inconsistent functionally. Pt able to move against gravity with good grip strength for functional tasks. Appears to have coordination deficits at times. Reporting decreased sensation distally. LUE Sensation: decreased light touch LUE Coordination: decreased fine motor;decreased gross motor    Lower Extremity Assessment Lower Extremity Assessment: RLE deficits/detail;LLE deficits/detail;Generalized weakness RLE Deficits / Details: hip flexors 3/5, hams 3+, quads 4/5, df/pf 4/5  pt with waxing and waning responses to resistance, often not abl eot sustain contraction RLE Sensation: (numbness in feet.) RLE Coordination: decreased fine motor LLE Deficits / Details: hip flexors 3-, hams 3+, quads 4, pf/df 4  unable to coordinate movement LLE Sensation: decreased light touch LLE Coordination: decreased fine motor    Cervical / Trunk Assessment Cervical / Trunk Assessment: Normal  Communication   Communication: No difficulties  Cognition Arousal/Alertness: Awake/alert Behavior During Therapy: Anxious Overall Cognitive Status: No family/caregiver present to determine baseline cognitive functioning Area of Impairment: Following commands;Safety/judgement;Awareness;Problem solving;Memory                     Memory: Decreased short-term memory Following Commands: Follows one step commands consistently Safety/Judgement: Decreased awareness of safety Awareness: Emergent Problem Solving: Decreased initiation;Difficulty sequencing;Requires verbal cues;Requires tactile cues General Comments: Very inconsistent presentation cognitively and functionally. Pt reporting she walked to bathroom to give urine and stool sample this morning but pt not OOB since admission per RN.      General Comments General comments (skin  integrity, edema, etc.): For various reasons, but mostly for anxiety from fear of falling and not being in control, pt frenquently had RR in the 50's and HR in the 160's.  Pt would frequently need calming cues to brng her vitals down, but each new, scary tasks sent RR and HR up.    Exercises     Assessment/Plan    PT Assessment Patient needs continued PT services  PT Problem List Decreased strength;Decreased activity tolerance;Decreased balance;Decreased mobility;Decreased coordination;Decreased safety awareness;Impaired sensation       PT Treatment Interventions Gait training;Functional mobility training;Therapeutic activities;Therapeutic exercise;Balance training;Patient/family education    PT Goals (Current goals can be found in the Care Plan section)  Acute Rehab PT Goals Patient Stated Goal: to get better PT Goal Formulation: With patient Time For Goal Achievement: 02/04/18 Potential to Achieve Goals: Good    Frequency Min 3X/week   Barriers to discharge   lives alone with boyfriend and family working.    Co-evaluation PT/OT/SLP Co-Evaluation/Treatment: Yes Reason for Co-Treatment: Necessary to address cognition/behavior during functional activity;Complexity of the patient's impairments (multi-system involvement) PT goals addressed during session: Mobility/safety with mobility OT goals addressed during session: ADL's and self-care       AM-PAC PT "6 Clicks" Daily Activity  Outcome Measure Difficulty turning over in bed (including adjusting bedclothes, sheets and blankets)?: Unable Difficulty moving from lying on back to sitting on the side of the bed? : Unable Difficulty sitting down on and standing up from a chair with arms (e.g., wheelchair, bedside commode, etc,.)?: Unable Help needed moving to and from a bed to chair (including a wheelchair)?: A Little Help needed walking in hospital  room?: A Lot Help needed climbing 3-5 steps with a railing? : A Lot 6 Click Score:  10    End of Session   Activity Tolerance: Other (comment)(anxiety limited) Patient left: in chair;with call bell/phone within reach;with chair alarm set Nurse Communication: Mobility status PT Visit Diagnosis: Unsteadiness on feet (R26.81);Other abnormalities of gait and mobility (R26.89);Muscle weakness (generalized) (M62.81);Other symptoms and signs involving the nervous system (R29.898)    Time: 1435-1520 PT Time Calculation (min) (ACUTE ONLY): 45 min   Charges:   PT Evaluation $PT Eval Moderate Complexity: 1 Mod     PT G Codes:        01/21/2018  [place] BingKen Dominik Lauricella, PT [phone]  (pager)  Eliseo GumKenneth V Sparsh Callens 01/21/2018, 5:11 PM

## 2018-01-21 NOTE — Evaluation (Signed)
Occupational Therapy Evaluation Patient Details Name: Debbie Golden MRN: [id number] DOB: [birth year] Today's Date: 01/21/2018    History of Present Illness 47 y.o.femalewith medical history significant forbipolar disorder with history of catatonia in 2017, obesity, macrocytic anemia, history of genital herpes, and regular alcohol use presenting with reports of pain and numbness in both legs for hte past week. MRI of cervical and thoracic spine did not reveal explanation for pts symptoms. MRI lumbar spine pending. Review of CSF not consistent with Guillian-Barr. Pt started on IVIG on 1/23.   Clinical Impression   Pt reports she was managing ADL independently PTA; was driving and working with increased difficulty recently due to weakness. Currently pt requires max assist +2 for stand pivot transfers, min assist for UB ADL in sitting, and max assist for LB ADL. Pt presenting with inconsistencies in strength and coordination during functional tasks. Pt appears exceptionally anxious regarding movement; pt hyperventilating while sitting EOB with HR up to 160, RR 57. Recommending SNF for follow up to maximize independence and safety with ADL and functional mobility prior to return home alone. Pt would benefit from continued skilled OT to address established goals.    Follow Up Recommendations  SNF;Supervision/Assistance - 24 hour    Equipment Recommendations  Other (comment)(TBD at next venue)    Recommendations for Other Services       Precautions / Restrictions Precautions Precautions: Fall Restrictions Weight Bearing Restrictions: No      Mobility Bed Mobility Overal bed mobility: Needs Assistance Bed Mobility: Supine to Sit     Supine to sit: Max assist;+2 for safety/equipment     General bed mobility comments: Pt able to come into long sitting in bed with min guard and begin bringing LEs over to EOB. Pt appears to get anxious with movement and required max assist to square hips up  to EOB and get feet flat on floor.  Transfers Overall transfer level: Needs assistance Equipment used: 2 person hand held assist Transfers: Sit to/from [employer] Sit to Stand: Max assist;+2 physical assistance;+2 safety/equipment Stand pivot transfers: Max assist;+2 physical assistance;+2 safety/equipment       General transfer comment: Max assist to boost up from EOB with use of bed pad. Cues thorughout for sequencing and safety. Pt throwing herself posteriorly prior to initial sit to stand from EOB. Inconsistent bil knee buckling noted but then pt with strength to stand at times too.    Balance Overall balance assessment: Needs assistance Sitting-balance support: Feet supported;Bilateral upper extremity supported Sitting balance-Leahy Scale: Poor Sitting balance - Comments: max to min guard assist   Standing balance support: Bilateral upper extremity supported Standing balance-Leahy Scale: Poor                             ADL either performed or assessed with clinical judgement   ADL Overall ADL's : Needs assistance/impaired Eating/Feeding: Minimal assistance;Sitting   Grooming: Minimal assistance;Sitting;Wash/dry face;Oral care Grooming Details (indicate cue type and reason): assist for toothpaste on toothbrush Upper Body Bathing: Minimal assistance;Sitting   Lower Body Bathing: Maximal assistance;+2 for physical assistance;Sit to/from stand   Upper Body Dressing : Minimal assistance;Sitting   Lower Body Dressing: Maximal assistance;+2 for physical assistance;Sit to/from stand   Toilet Transfer: Maximal assistance;+2 for physical assistance;Stand-pivot;BSC Toilet Transfer Details (indicate cue type and reason): Simulated by stand pivot EOB to chair         Functional mobility during ADLs: Maximal assistance;+2 for physical assistance(for  stand pivot only) General ADL Comments: Pt hyperventilating at times during session. HR up to 160 max  but typically in 120s-140s during session. RR up to 57 max.     Vision         Perception     Praxis      Pertinent Vitals/Pain Pain Assessment: Faces Faces Pain Scale: Hurts little more Pain Location: bil knees Pain Descriptors / Indicators: Aching Pain Intervention(s): Monitored during session;Repositioned     Hand Dominance Right   Extremity/Trunk Assessment Upper Extremity Assessment Upper Extremity Assessment: Generalized weakness;RUE deficits/detail;LUE deficits/detail RUE Deficits / Details: Inconsistent functionally. Pt able to move against gravity with good grip strength for functional tasks. Appears to have coordination deficits at times. Reporting decreased sensation distally. RUE Sensation: decreased light touch RUE Coordination: decreased fine motor;decreased gross motor LUE Deficits / Details: Inconsistent functionally. Pt able to move against gravity with good grip strength for functional tasks. Appears to have coordination deficits at times. Reporting decreased sensation distally. LUE Sensation: decreased light touch LUE Coordination: decreased fine motor;decreased gross motor   Lower Extremity Assessment Lower Extremity Assessment: Defer to PT evaluation   Cervical / Trunk Assessment Cervical / Trunk Assessment: Normal   Communication Communication Communication: No difficulties   Cognition Arousal/Alertness: Awake/alert Behavior During Therapy: Anxious Overall Cognitive Status: No family/caregiver present to determine baseline cognitive functioning Area of Impairment: Following commands;Safety/judgement;Awareness;Problem solving;Memory                     Memory: Decreased short-term memory Following Commands: Follows one step commands consistently Safety/Judgement: Decreased awareness of safety Awareness: Emergent Problem Solving: Decreased initiation;Difficulty sequencing;Requires verbal cues;Requires tactile cues General Comments: Very  inconsistent presentation cognitively and functionally. Pt reporting she walked to bathroom to give urine and stool sample this morning but pt not OOB since admission per RN.   General Comments       Exercises     Shoulder Instructions      Home Living Family/patient expects to be discharged to:: Private residence Living Arrangements: Alone Available Help at Discharge: Family;Available PRN/intermittently Type of Home: House(townhome) Home Access: Stairs to enter Entrance Stairs-Number of Steps: 8   Home Layout: Two level;Bed/bath upstairs     Bathroom Shower/Tub: Chief Strategy Officer: Handicapped height     Home Equipment: Cane - single point;Crutches;Bedside commode          Prior Functioning/Environment Level of Independence: Independent        Comments: driving, working in medical transcription        OT Problem List: Decreased strength;Decreased activity tolerance;Impaired balance (sitting and/or standing);Decreased coordination;Decreased cognition;Decreased safety awareness;Decreased knowledge of use of DME or AE;Cardiopulmonary status limiting activity;Impaired sensation;Impaired tone;Obesity;Impaired UE functional use      OT Treatment/Interventions: Self-care/ADL training;Therapeutic exercise;Neuromuscular education;Energy conservation;DME and/or AE instruction;Therapeutic activities;Cognitive remediation/compensation;Patient/family education;Balance training    OT Goals(Current goals can be found in the care plan section) Acute Rehab OT Goals Patient Stated Goal: to get better OT Goal Formulation: With patient Time For Goal Achievement: 02/04/18 Potential to Achieve Goals: Good ADL Goals Pt Will Perform Eating: with set-up;sitting(with or without adaptive utensils) Pt Will Perform Grooming: with set-up;with supervision;sitting Pt Will Transfer to Toilet: with min assist;stand pivot transfer;bedside commode Pt/caregiver will Perform Home  Exercise Program: Increased strength;Both right and left upper extremity;With theraband;With theraputty;Independently;With written HEP provided(increase fine and gross motor coordination)  OT Frequency: Min 2X/week   Barriers to D/C: Decreased caregiver support  lives alone  Co-evaluation PT/OT/SLP Co-Evaluation/Treatment: Yes Reason for Co-Treatment: Necessary to address cognition/behavior during functional activity;For patient/therapist safety;To address functional/ADL transfers   OT goals addressed during session: ADL's and self-care      AM-PAC PT "6 Clicks" Daily Activity     Outcome Measure Help from another person eating meals?: A Little Help from another person taking care of personal grooming?: A Little Help from another person toileting, which includes using toliet, bedpan, or urinal?: A Lot Help from another person bathing (including washing, rinsing, drying)?: A Lot Help from another person to put on and taking off regular upper body clothing?: A Little Help from another person to put on and taking off regular lower body clothing?: A Lot 6 Click Score: 15   End of Session Nurse Communication: Mobility status;Need for lift equipment  Activity Tolerance: Patient tolerated treatment well;Other (comment)(limited by anxiety and fear of falling) Patient left: in chair;with call bell/phone within reach;with chair alarm set;with family/visitor present  OT Visit Diagnosis: Unsteadiness on feet (R26.81);Other abnormalities of gait and mobility (R26.89);Muscle weakness (generalized) (M62.81)                Time: [id number] OT Time Calculation (min): 40 min Charges:  OT General Charges $OT Visit: 1 Visit OT Evaluation $OT Eval Moderate Complexity: 1 Mod OT Treatments $Self Care/Home Management : 8-22 mins G-Codes:     Epic Tribbett A. Brett Albinooffey, M.S., OTR/L Pager: [phone]  Gaye AlkenBailey A Kwanza Cancelliere 01/21/2018, 3:55 PM

## 2018-01-21 NOTE — Progress Notes (Signed)
Patient with very minimal urine output since admission. Patient voided about 200cc of very dark urine in bedpan. Post void bladder scan was performed which showed a max volume of 150cc. Patient has no complaints of bladder fullness or urgency.

## 2018-01-21 NOTE — Progress Notes (Signed)
RT instructed patient to perform NIF and VC. Pt understood instruction well and gave great effort. NIF -40. VC 2.5L Will cont to monitor

## 2018-01-21 NOTE — Progress Notes (Signed)
PROGRESS NOTE    Debbie Golden  HGD:[id number] DOB: 1971/03/12 DOA: 01/20/2018 PCP: Patient, No Pcp Per   Chief Complaint  Patient presents with  . Knee Pain  . Weakness    Brief Narrative:  HPI on 01/20/2018 by Ms. Erin Hearing, NP Debbie Golden is a 47 y.o. female with medical history significant for bipolar disorder with history of catatonia in 2017, obesity, macrocytic anemia, history of genital herpes, and regular alcohol use.  Patient presented to [facility] on the afternoon of 1/22 reporting pain and numbness in both legs greater in the left leg ongoing for a week.  She reports chronic back problems and had a steroid injection 1 week prior.  She reported increased difficulty with ambulation at home and no recent trauma.  When I questioned the patient she reports she has been having difficulty ambulating and weakness/numbness/tingling in the legs waxing and waning for 1 month.  CT of the head was unremarkable.  Due to concerns of possible Guillain- Barr patient was subsequently transferred to this facility so she can undergo a lumbar puncture.  After arrival to [facility] she was evaluated by neurology who documented on exam patient had absent generalized DTRs, mild bilateral lower extremity motor weakness averaging 4/5 from the hips down with decreased wrist flexion, extension, abduction bilaterally at 4/5 as well.  She was not exhibiting any respiratory symptoms or signs of bowel or bladder incontinence or urinary retention.  MRI of the cervical and thoracic spine did not reveal explanation for patient's current symptoms.  MR lumbar spine pending.  Subsequent review of CSF is not consistent with Guillian-Barr.  She will be admitted to the stepdown unit for further monitoring and treatment.  Interim history Neurology consulted, recommended 5 days for IVIG.  Assessment & Plan   Bilateral lower extremity weakness, distal upper extremity weakness -Neurology consulted and appreciated, suspecting possible  Ethelene Hal -MRI cervical and thoracic spine normal -MRI lumbar spine, lower thoracic spinal cord and cauda equina. No evidence of intradural inflammation. Chronic lumbar disc degeneration L5-S1. Small lumbar disc herniation L1-L2. -CSF unremarkable, normal -Neurology consulted and appreciated, recommended 5 days IVIG -PT, OT consulted and appreciated -CRP 2.4, ESR 27  Hypokalemia -Resolved, K monitor BMP  Hypophosphatemia -Replacing, continue to monitor   Macrocytic anemia  -Baseline hemoglobin approximately 9-10, currently -Anemia panel showed normal iron, low folate, B-12 in the low normal range -Folate being replaced, patient was also given 1 dose of 5 and be 12 AM -Vitamin B 6 level pending -Immobile but currently 8.2  Alcohol use -Patient inconsistent regarding alcohol intake. Reports of approximate 2 drinks every other day versus daily. -Continue to monitor for alcohol withdrawal  Obesity  -History of gastric bypass  Bipolar disorder -Continue Lamictal, Risperdal  Abnormal TSH -TSH 4.753, free T4 1.15 -Would recheck thyroid function test in 4-6 weeks. Will speak with endocrinology  DVT Prophylaxis  Lovenox  Code Status: Full  Family Communication: None at bedside  Disposition Plan: Admitted  Consultants Neurology   Procedures  Lumbar puncture  Antibiotics   Anti-infectives (From admission, onward)   None      Subjective:   Debbie Golden seen and examined today.  Patient continues to have leg weakness. She feels anxious. Denies current dizziness, chest pain, shortness of breath, abdominal pain, N/V/D/C.  Objective:   Vitals:   01/21/18 0400 01/21/18 0746 01/21/18 0815 01/21/18 1133  BP: 112/88 116/83 (!) 124/101 124/84  Pulse: (!) 115 (!) 109 (!) 105 (!) 114  Resp: 18 (!)  26  (!) 31  Temp: 99 F (37.2 C) 99.2 F (37.3 C)  98.3 F (36.8 C)  TempSrc: Oral Oral  Oral  SpO2: 99% 99% 99% 100%  Weight: 106.4 kg (234 lb 9.1 oz)     Height:         Intake/Output Summary (Last 24 hours) at 01/21/2018 1431 Last data filed at 01/21/2018 1100 Gross per 24 hour  Intake 1381.67 ml  Output 200 ml  Net 1181.67 ml   Filed Weights   01/20/18 1124 01/21/18 0400  Weight: 100.3 kg (221 lb 1.9 oz) 106.4 kg (234 lb 9.1 oz)    Exam  General: Well developed, well nourished, NAD, appears stated age  HEENT: NCAT, mucous membranes moist.   Cardiovascular: S1 S2 auscultated, tachycardic, no murmurs  Respiratory: Clear to auscultation bilaterally with equal chest rise  Abdomen: Soft, nontender, nondistended, + bowel sounds  Extremities: warm dry without cyanosis clubbing or edema  Neuro: AAOx3, cranial nerves grossly intact. B/L wrist strength 3-4/5, LE strength 4/5  Psych: Anxious but appropriate   Data Reviewed: I have personally reviewed following labs and imaging studies  CBC: Recent Labs  Lab 01/19/18 1924 01/21/18 0349  WBC 9.5 7.2  NEUTROABS 7.4*  --   HGB 9.5* 8.2*  HCT 27.6* 24.7*  MCV 121.9* 122.9*  PLT 331 765   Basic Metabolic Panel: Recent Labs  Lab 01/19/18 1924 01/20/18 0218 01/20/18 1153 01/21/18 0349  NA 138 138 138 138  K 2.6* 2.9* 3.5 3.7  CL 100* 103 105 107  CO2 27 23 20* 21*  GLUCOSE 116* 101* 132* 97  BUN 9 9 6 6   CREATININE 0.60 0.64 0.77 0.67  CALCIUM 8.4* 8.1* 8.0* 7.7*  MG  --  1.9  --   --   PHOS  --   --  2.0* 2.2*   GFR: Estimated Creatinine Clearance: 109.1 mL/min (by C-G formula based on SCr of 0.67 mg/dL). Liver Function Tests: Recent Labs  Lab 01/21/18 0349  AST 125*  ALT 44  ALKPHOS 190*  BILITOT 3.0*  PROT 6.6  ALBUMIN 2.4*   No results for input(s): LIPASE, AMYLASE in the last 168 hours. No results for input(s): AMMONIA in the last 168 hours. Coagulation Profile: No results for input(s): INR, PROTIME in the last 168 hours. Cardiac Enzymes: No results for input(s): CKTOTAL, CKMB, CKMBINDEX, TROPONINI in the last 168 hours. BNP (last 3 results) No results for  input(s): PROBNP in the last 8760 hours. HbA1C: No results for input(s): HGBA1C in the last 72 hours. CBG: No results for input(s): GLUCAP in the last 168 hours. Lipid Profile: No results for input(s): CHOL, HDL, LDLCALC, TRIG, CHOLHDL, LDLDIRECT in the last 72 hours. Thyroid Function Tests: Recent Labs    01/20/18 0733 01/21/18 0349  TSH 4.753*  --   FREET4  --  1.15*   Anemia Panel: Recent Labs    01/20/18 0733  VITAMINB12 401  FOLATE 2.4*  FERRITIN 341*  TIBC 153*  IRON 84  RETICCTPCT 6.2*   Urine analysis:    Component Value Date/Time   COLORURINE AMBER (A) 01/19/2018 2058   APPEARANCEUR HAZY (A) 01/19/2018 2058   LABSPEC 1.013 01/19/2018 2058   PHURINE 6.0 01/19/2018 2058   GLUCOSEU NEGATIVE 01/19/2018 2058   HGBUR NEGATIVE 01/19/2018 2058   BILIRUBINUR NEGATIVE 01/19/2018 2058   KETONESUR NEGATIVE 01/19/2018 2058   PROTEINUR NEGATIVE 01/19/2018 2058   NITRITE NEGATIVE 01/19/2018 2058   LEUKOCYTESUR NEGATIVE 01/19/2018 2058   Sepsis  Labs: @LABRCNTIP (procalcitonin:4,lacticidven:4)  ) Recent Results (from the past 240 hour(s))  CSF culture     Status: None (Preliminary result)   Collection Time: 01/20/18  3:55 AM  Result Value Ref Range Status   Specimen Description CSF  Final   Special Requests NONE  Final   Gram Stain   Final    WBC PRESENT, PREDOMINANTLY MONONUCLEAR NO ORGANISMS SEEN CYTOSPIN SMEAR    Culture NO GROWTH 1 DAY  Final   Report Status PENDING  Incomplete  MRSA PCR Screening     Status: None   Collection Time: 01/20/18 11:33 AM  Result Value Ref Range Status   MRSA by PCR NEGATIVE NEGATIVE Final    Comment:        The GeneXpert MRSA Assay (FDA approved for NASAL specimens only), is one component of a comprehensive MRSA colonization surveillance program. It is not intended to diagnose MRSA infection nor to guide or monitor treatment for MRSA infections.       Radiology Studies: Ct Head Wo Contrast  Result Date:  01/19/2018 CLINICAL DATA:  Pain and numbness in the legs. Symptoms have been going on for a week. EXAM: CT HEAD WITHOUT CONTRAST TECHNIQUE: Contiguous axial images were obtained from the base of the skull through the vertex without intravenous contrast. COMPARISON:  11/09/2016 FINDINGS: Brain: No evidence of acute infarction, hemorrhage, hydrocephalus, extra-axial collection or mass lesion/mass effect. Vascular: No hyperdense vessel or unexpected calcification. Skull: Normal. Negative for fracture or focal lesion. Sinuses/Orbits: Mucosal disease in the right sphenoid sinus. Other: None. IMPRESSION: No acute intracranial abnormality. Electronically Signed   By: Markus Daft M.D.   On: 01/19/2018 21:54   Mr Cervical Spine Wo Contrast  Result Date: 01/20/2018 CLINICAL DATA:  Initial evaluation for progressive ascending weakness and numbness. EXAM: MRI CERVICAL, THORACIC SPINE WITHOUT CONTRAST TECHNIQUE: Multiplanar and multiecho pulse sequences of the cervical spine, to include the craniocervical junction and cervicothoracic junction, and thoracic and lumbar spine, were obtained without intravenous contrast. COMPARISON:  None available. FINDINGS: MRI CERVICAL SPINE FINDINGS Alignment: Study moderately degraded by motion artifact. Straightening with slight reversal of the normal cervical lordosis, apex at C5. No listhesis. Vertebrae: Vertebral body heights are maintained without evidence for acute or chronic fracture. Bone marrow signal intensity within normal limits. No discrete or worrisome osseous lesions. No abnormal marrow edema. Cord: Signal intensity within cervical spinal cord within normal limits. No appreciable cord signal abnormality identified on this motion degraded exam. Posterior Fossa, vertebral arteries, paraspinal tissues: Visualized brain within normal limits. Craniocervical junction normal. Scattered mucosal thickening noted with in the visualized paranasal sinuses. Paraspinous and prevertebral  soft tissues within normal limits. Normal intravascular flow voids present within the vertebral arteries bilaterally. Disc levels: C2-C3: Unremarkable. C3-C4:  Unremarkable. C4-C5: Mild diffuse disc bulge with bilateral uncovertebral hypertrophy. Resultant mild right C5 foraminal stenosis. No significant canal narrowing. C5-C6: Chronic diffuse degenerative disc osteophyte with intervertebral disc space narrowing. Broad posterior component mildly flattens the ventral thecal sac without significant spinal stenosis. Moderate bilateral C6 foraminal stenosis, slightly worse on the right. C6-C7: Mild diffuse chronic disc osteophyte with intervertebral disc space narrowing. Flattening of the ventral thecal sac without significant spinal stenosis. Mild bilateral C7 foraminal stenosis. C7-T1: Mild facet hypertrophy. Otherwise unremarkable without stenosis. MRI THORACIC SPINE FINDINGS Alignment: Vertebral bodies normally aligned with preservation of the normal thoracic kyphosis. No listhesis. Vertebrae: Vertebral body heights are maintained without evidence for acute or chronic fracture. Bone marrow signal intensity within normal limits. No discrete or  worrisome osseous lesions. No abnormal marrow edema. Cord: Signal intensity within the thoracic spinal cord within normal limits. No cord signal abnormality seen on this motion degraded study. Conus medullaris terminates at the L1 level. Overall cord caliber normal. Paraspinal and other soft tissues: Paraspinous soft tissues within normal limits. Partially visualized lungs are grossly clear. Visualized visceral structures are unremarkable. Disc levels: T7-8: Mild disc bulge without significant stenosis. L1-2: Seen only on sagittal projection. Disc bulge without significant canal stenosis. No foraminal encroachment. No other significant degenerative changes seen within the thoracic spine. No significant canal or neural foraminal stenosis. IMPRESSION: MRI CERVICAL SPINE  IMPRESSION: 1. Normal MRI appearance of the cervical spinal cord. 2. 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 Mild to moderate bilateral foraminal narrowing at these levels as above. MRI THORACIC SPINE IMPRESSION: 1. Normal MRI appearance of the thoracic spinal cord. 2. Minimal disc bulging at T7-8 without stenosis. No other significant degenerative changes within the thoracic spine. No significant stenosis. Electronically Signed   By: Jeannine Boga M.D.   On: 01/20/2018 06:42   Mr Thoracic Spine Wo Contrast  Result Date: 01/20/2018 CLINICAL DATA:  Initial evaluation for progressive ascending weakness and numbness. EXAM: MRI CERVICAL, THORACIC SPINE WITHOUT CONTRAST TECHNIQUE: Multiplanar and multiecho pulse sequences of the cervical spine, to include the craniocervical junction and cervicothoracic junction, and thoracic and lumbar spine, were obtained without intravenous contrast. COMPARISON:  None available. FINDINGS: MRI CERVICAL SPINE FINDINGS Alignment: Study moderately degraded by motion artifact. Straightening with slight reversal of the normal cervical lordosis, apex at C5. No listhesis. Vertebrae: Vertebral body heights are maintained without evidence for acute or chronic fracture. Bone marrow signal intensity within normal limits. No discrete or worrisome osseous lesions. No abnormal marrow edema. Cord: Signal intensity within cervical spinal cord within normal limits. No appreciable cord signal abnormality identified on this motion degraded exam. Posterior Fossa, vertebral arteries, paraspinal tissues: Visualized brain within normal limits. Craniocervical junction normal. Scattered mucosal thickening noted with in the visualized paranasal sinuses. Paraspinous and prevertebral soft tissues within normal limits. Normal intravascular flow voids present within the vertebral arteries bilaterally. Disc levels: C2-C3: Unremarkable. C3-C4:  Unremarkable.  C4-C5: Mild diffuse disc bulge with bilateral uncovertebral hypertrophy. Resultant mild right C5 foraminal stenosis. No significant canal narrowing. C5-C6: Chronic diffuse degenerative disc osteophyte with intervertebral disc space narrowing. Broad posterior component mildly flattens the ventral thecal sac without significant spinal stenosis. Moderate bilateral C6 foraminal stenosis, slightly worse on the right. C6-C7: Mild diffuse chronic disc osteophyte with intervertebral disc space narrowing. Flattening of the ventral thecal sac without significant spinal stenosis. Mild bilateral C7 foraminal stenosis. C7-T1: Mild facet hypertrophy. Otherwise unremarkable without stenosis. MRI THORACIC SPINE FINDINGS Alignment: Vertebral bodies normally aligned with preservation of the normal thoracic kyphosis. No listhesis. Vertebrae: Vertebral body heights are maintained without evidence for acute or chronic fracture. Bone marrow signal intensity within normal limits. No discrete or worrisome osseous lesions. No abnormal marrow edema. Cord: Signal intensity within the thoracic spinal cord within normal limits. No cord signal abnormality seen on this motion degraded study. Conus medullaris terminates at the L1 level. Overall cord caliber normal. Paraspinal and other soft tissues: Paraspinous soft tissues within normal limits. Partially visualized lungs are grossly clear. Visualized visceral structures are unremarkable. Disc levels: T7-8: Mild disc bulge without significant stenosis. L1-2: Seen only on sagittal projection. Disc bulge without significant canal stenosis. No foraminal encroachment. No other significant degenerative changes seen within the thoracic spine. No significant  canal or neural foraminal stenosis. IMPRESSION: MRI CERVICAL SPINE IMPRESSION: 1. Normal MRI appearance of the cervical spinal cord. 2. 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 Mild to moderate bilateral  foraminal narrowing at these levels as above. MRI THORACIC SPINE IMPRESSION: 1. Normal MRI appearance of the thoracic spinal cord. 2. Minimal disc bulging at T7-8 without stenosis. No other significant degenerative changes within the thoracic spine. No significant stenosis. Electronically Signed   By: Jeannine Boga M.D.   On: 01/20/2018 06:42   Mr Lumbar Spine W Wo Contrast  Result Date: 01/20/2018 CLINICAL DATA:  47 year old female with progressive ascending weakness and numbness. EXAM: MRI LUMBAR SPINE WITHOUT AND WITH CONTRAST TECHNIQUE: Multiplanar and multiecho pulse sequences of the lumbar spine were obtained without and with intravenous contrast. CONTRAST:  71m MULTIHANCE GADOBENATE DIMEGLUMINE 529 MG/ML IV SOLN COMPARISON:  Cervical and thoracic spine MRI 0534 hr today. Lumbar puncture images 0 347 hr today. FINDINGS: Segmentation: Normal lumbar segmentation demonstrated on the LP images. This numbering appears concordant with the cervical and thoracic spine numbering earlier today. Alignment:  Preserved lumbar lordosis. Vertebrae: Chronic degenerative endplate marrow signal changes at L5-S1. Elsewhere Visualized bone marrow signal is within normal limits. No marrow edema or evidence of acute osseous abnormality. Intact visible sacrum and SI joints. Conus medullaris and cauda equina: Conus extends to the T12-L1 level. Normal conus. No abnormal intradural enhancement. No thickening of the cauda equina, nerve roots appear normal. Paraspinal and other soft tissues: Negative visualized abdominal viscera. Posterior paraspinal soft tissues are within normal limits; there is a degree of diffuse paraspinal muscle atrophy. Disc levels: T12-L1:  Negative. L1-L2: Small central to slightly left paracentral disc herniation (series 6, image 10). No associated stenosis. L2-L3:  Mild facet hypertrophy.  No stenosis. L3-L4:  Mild facet hypertrophy.  No stenosis. L4-L5:  Mild facet hypertrophy.  No stenosis.  L5-S1: Advanced disc space loss. Probable vacuum disc. Circumferential disc osteophyte complex with broad-based posterior component. Mild facet hypertrophy. Mild epidural lipomatosis. No significant spinal stenosis or convincing lateral recess stenosis. There is moderate left and mild right L5 neural foraminal stenosis. IMPRESSION: 1. Normal lower thoracic spinal cord and cauda equina. No evidence of intradural inflammation. 2. Advanced chronic lumbar disc degeneration at L5-S1. No associated spinal stenosis, but mild to moderate L5 neural foraminal stenosis greater on the left. 3. Small lumbar disc herniation at L1-L2 without convincing neural impingement. 4.  No acute osseous abnormality. Electronically Signed   By: HGenevie AnnM.D.   On: 01/20/2018 11:46   Dg Fluoro Guide Lumbar Puncture  Result Date: 01/20/2018 CLINICAL DATA:  Numbness in the legs and arms. EXAM: DIAGNOSTIC LUMBAR PUNCTURE UNDER FLUOROSCOPIC GUIDANCE FLUOROSCOPY TIME:  Fluoroscopy Time:  1 minute 0 seconds. Radiation Exposure Index (if provided by the fluoroscopic device): Dose area product is 270 micro Gy meter square. Reference air kerma is 21 mGy Number of Acquired Spot Images: 1 PROCEDURE: Informed consent was obtained from the patient prior to the procedure, including potential complications of headache, allergy, and pain. With the patient prone, the lower back was prepped with Betadine. 1% Lidocaine was used for local anesthesia. Lumbar puncture was performed at the L4-5 level using a 20 gauge needle with return of clear CSF. 5 ml of CSF were obtained for laboratory studies. During collection for the third tube, the patient moved, dislodging the needle and the procedure was ended. The last of the fluid became blood-tinged after the needle was dislodged. Opening pressure  was not obtained as the patient was unable to tolerate positioning for opening pressure. Subjectively, the pressure was not elevated. The patient tolerated the procedure  well and there were no apparent complications. Patient complained of loss of feeling in her legs both before and after the procedure without significant change. IMPRESSION: Lumbar puncture performed under fluoroscopy at the L4-5 level. 5 mL clear CSF was obtained. Patient tolerated the procedure well without complication. Electronically Signed   By: Lucienne Capers M.D.   On: 01/20/2018 04:21     Scheduled Meds: . aspirin EC  325 mg Oral Daily  . enoxaparin (LOVENOX) injection  40 mg Subcutaneous Q24H  . folic acid  1 mg Oral Daily  . lamoTRIgine  100 mg Oral Daily  . risperiDONE  0.5 mg Oral QHS  . thiamine  100 mg Oral Daily   Continuous Infusions: . dextrose 5 % and 0.9 % NaCl with KCl 20 mEq/L 100 mL/hr at 01/21/18 1427  . Immune Globulin 10% Stopped (01/20/18 2224)     LOS: 1 day   Time Spent in minutes   30 minutes  Mervil Wacker D.O. on 01/21/2018 at 2:31 PM  Between 7am to 7pm - Pager - [phone]  After 7pm go to [email or web address] - password [email or web address]  And look for the night coverage person covering for me after hours  [facility] Office  [phone]

## 2018-01-21 NOTE — Progress Notes (Signed)
Subjective: No complaints at this time.  Does not feel as though there is any change in her strength  Exam: Vitals:   01/21/18 0746 01/21/18 0815  BP: 116/83 (!) 124/101  Pulse: (!) 109 (!) 105  Resp: (!) 26   Temp: 99.2 F (37.3 C)   SpO2: 99% 99%    Physical Exam   HEENT-  Normocephalic, no lesions, without obvious abnormality.  Normal external eye and conjunctiva.   Cardiovascular- S1-S2 audible, pulses palpable throughout   Lungs-no rhonchi or wheezing noted, no excessive working breathing.  Saturations within normal limits Abdomen- All 4 quadrants palpated and nontender Extremities- Warm, dry and intact Musculoskeletal-no joint tenderness, deformity or swelling Skin-warm and dry, no hyperpigmentation, vitiligo, or suspicious lesions    Neuro:  Mental Status: Patient is awake, alert, oriented to person, place, month, year, and situation. Patient is able to give a clear and coherent history. No signs of aphasia or neglect Cranial Nerves: II: Visual Fields are full. Pupils are equal, round, and reactive to light.   III,IV, VI: EOMI without ptosis or diploplia.  V: Facial sensation is symmetric to temperature VII: Facial movement is symmetric.  VIII: hearing is intact to voice X: Uvula elevates symmetrically XI: Shoulder shrug is symmetric. XII: tongue is midline without atrophy or fasciculations.  Motor:                                                 Right                Left Shoulder Abduction                5/5                   5/5 Elbow Flexion                          5/5                   5/5 Elbow Extension                     4/5                   4/5 Wrist Flexion                           4+/5                 4+/5 Wrist Extension                       3/5                   3/5 Thumb abduction                    4-/5                  4-/5  Hip Flexion                              5/5                  5/5 Knee Flexion  4/5                    4/5 Knee Extension                       4+/5                 4+/5 Ankle Dorsiflexion                   4/5                   4/5 Ankle Plantarflexion                4+/5                 4+/5  It should be noted that during this exam I did feel as though she was not giving full effort in her strength as at times I caught her getting more strength when not paying attention or distracted  Sensory:--Remain the same from previous exam She has mild reduction to temperature in the distal extremities bilaterally, rather profound reduction to vibration with absent vibration at the toes and knees bilaterally, mildly reduced at the knuckles Deep Tendon Reflexes:--May the same from previous exam Absent throughout  Cerebellar: FNF intact bilaterally       Medications:  Scheduled: . aspirin EC  325 mg Oral Daily  . enoxaparin (LOVENOX) injection  40 mg Subcutaneous Q24H  . folic acid  1 mg Oral Daily  . lamoTRIgine  100 mg Oral Daily  . risperiDONE  0.5 mg Oral QHS  . thiamine  100 mg Oral Daily   Continuous: . dextrose 5 % and 0.9 % NaCl with KCl 20 mEq/L 100 mL/hr at 01/21/18 0510  . Immune Globulin 10% Stopped (01/20/18 2224)   UEA:[id number] **OR** acetaminophen, ondansetron **OR** ondansetron (ZOFRAN) IV, traMADol  Pertinent Labs/Diagnostics:  It should be noted this was a traumatic tap and tube 1 had white blood cell count of 36 however 2 4 was only 2, in addition to 1 had 50 lymphocytes where to for only had a few   Results for JI, [place] (MRN [id number]) as of 01/21/2018 11:18  Ref. Range 01/20/2018 03:57 01/20/2018 03:57  Appearance, CSF Latest Ref Range: CLEAR  HAZY (A) CLOUDY (A)  Glucose, CSF Latest Ref Range: 40 - 70 mg/dL 57   RBC Count, CSF Latest Ref Range: 0 /cu mm 81 (H) 13,300 (H)  Segmented Neutrophils-CSF Latest Ref Range: 0 - 6 % FEW 81 (H)  Lymphs, CSF Latest Ref Range: 40 - 80 % FEW 15 (L)  Monocyte-Macrophage-Spinal Fluid Latest Ref Range: 15 - 45 %  RARE 4 (L)  Eosinophils, CSF Latest Ref Range: 0 - 1 % 0 0  Other Cells, CSF Unknown TOO FEW TO COUNT,...   Color, CSF Latest Ref Range: COLORLESS  COLORLESS PINK (A)  Supernatant Unknown NOT INDICATED CLEAR  Total  Protein, CSF Latest Ref Range: 15 - 45 mg/dL 30   Tube # Unknown 1 4  WBC, CSF Latest Ref Range: 0 - 5 /cu mm 2 36 (HH)   CSF culture was negative C-reactive protein was 2.4 Sediment rate was 27 TSH  4.753 B12 401  Ct Head Wo Contrast  Result Date: 01/19/2018 CLINICAL DATA:  Pain and numbness in the legs. Symptoms have been going on for a week. EXAM: CT HEAD WITHOUT CONTRAST TECHNIQUE: Contiguous axial images were obtained from the base of  the skull through the vertex without intravenous contrast. COMPARISON:  11/09/2016 FINDINGS: Brain: No evidence of acute infarction, hemorrhage, hydrocephalus, extra-axial collection or mass lesion/mass effect. Vascular: No hyperdense vessel or unexpected calcification. Skull: Normal. Negative for fracture or focal lesion. Sinuses/Orbits: Mucosal disease in the right sphenoid sinus. Other: None. IMPRESSION: No acute intracranial abnormality. Electronically Signed   By: Richarda OverlieAdam  Henn M.D.   On: 01/19/2018 21:54   Mr Cervical Spine Wo Contrast  Result Date: 01/20/2018  IMPRESSION: MRI CERVICAL SPINE IMPRESSION: 1. Normal MRI appearance of the cervical spinal cord. 2. 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 Mild to moderate bilateral foraminal narrowing at these levels as above. MRI THORACIC SPINE IMPRESSION: 1. Normal MRI appearance of the thoracic spinal cord. 2. Minimal disc bulging at T7-8 without stenosis. No other significant degenerative changes within the thoracic spine. No significant stenosis. Electronically Signed   By: Rise MuBenjamin  McClintock M.D.   On: 01/20/2018 06:42   Mr Thoracic Spine Wo Contrast  . IMPRESSION: MRI CERVICAL SPINE IMPRESSION: 1. Normal MRI appearance of the cervical spinal  cord. 2. 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 Mild to moderate bilateral foraminal narrowing at these levels as above. MRI THORACIC SPINE IMPRESSION: 1. Normal MRI appearance of the thoracic spinal cord. 2. Minimal disc bulging at T7-8 without stenosis. No other significant degenerative changes within the thoracic spine. No significant stenosis. Electronically Signed   By: Rise MuBenjamin  McClintock M.D.   On: 01/20/2018 06:42   Mr Lumbar Spine W Wo Contrast  Result Date: 01/20/2018 IMPRESSION: 1. Normal lower thoracic spinal cord and cauda equina. No evidence of intradural inflammation. 2. Advanced chronic lumbar disc degeneration at L5-S1. No associated spinal stenosis, but mild to moderate L5 neural foraminal stenosis greater on the left. 3. Small lumbar disc herniation at L1-L2 without convincing neural impingement. 4.  No acute osseous abnormality. Electronically Signed   By: Odessa FlemingH  Hall M.D.   On: 01/20/2018 11:46   Dg Fluoro Guide Lumbar Puncture Result Date: 01/20/2018  IMPRESSION: Lumbar puncture performed under fluoroscopy at the L4-5 level. 5 mL clear CSF was obtained. Patient tolerated the procedure well without complication. Electronically Signed   By: Burman NievesWilliam  Stevens M.D.   On: 01/20/2018 04:21     Felicie MornDavid Smith PA-C [facility] [phone]  Attending Neurohospitalist Addendum Patient seen and examined with APP/Resident. Agree with the history and physical as documented above. Agree with the plan as documented, which I helped formulate. I have independently reviewed the chart, obtained history, review of systems and examined the patient.I have personally reviewed pertinent head/neck/spine imaging (CT/MRI).    Impression: 47 year old female with progressive ascending weakness and numbness.  Lumbar puncture did not show a pleocytosis in addition protein was not elevated. Strong clinical suspicion for Guillain-Barr syndrome.  Exam  has been inconsistent but clinical suspicion remains. Spine imaging unrevealing. Patient started on IVIG yesterday.  Recommendations: Continue with 5 days of IVIG Check frequent NIF/FVC We will follow with you.     01/21/2018, 11:13 AM   Please feel free to call with any questions. --- Milon DikesAshish Linc Renne, MD [facility] Pager: [phone]  If 7pm to 7am, please call on call as listed on AMION.

## 2018-01-21 NOTE — Progress Notes (Signed)
VC and NIF performed with patient.  VC 2.15 L, NIF -35 with good patient effort.

## 2018-01-21 NOTE — Progress Notes (Signed)
PT confused this morning. Pt is alert and oriented but confused. Pt ask how she got to the hospital.  Pt asked where the people are that came with her when she came to the hospital.  Also pt didn't know why she had been hospitalized, and she continues to ask me if I gave her medications last night.

## 2018-01-22 ENCOUNTER — Ambulatory Visit: Payer: BLUE CROSS/BLUE SHIELD

## 2018-01-22 DIAGNOSIS — R202 Paresthesia of skin: Secondary | ICD-10-CM

## 2018-01-22 DIAGNOSIS — R200 Anesthesia of skin: Secondary | ICD-10-CM

## 2018-01-22 DIAGNOSIS — F10230 Alcohol dependence with withdrawal, uncomplicated: Secondary | ICD-10-CM

## 2018-01-22 DIAGNOSIS — G9340 Encephalopathy, unspecified: Secondary | ICD-10-CM

## 2018-01-22 DIAGNOSIS — Z008 Encounter for other general examination: Secondary | ICD-10-CM

## 2018-01-22 DIAGNOSIS — R4182 Altered mental status, unspecified: Secondary | ICD-10-CM

## 2018-01-22 LAB — PHOSPHORUS: Phosphorus: 2.4 mg/dL — ABNORMAL LOW (ref 2.5–4.6)

## 2018-01-22 LAB — GLUCOSE, CAPILLARY: Glucose-Capillary: 114 mg/dL — ABNORMAL HIGH (ref 65–99)

## 2018-01-22 LAB — CBC
HCT: 24.3 % — ABNORMAL LOW (ref 36.0–46.0)
HEMOGLOBIN: 8 g/dL — AB (ref 12.0–15.0)
MCH: 40.8 pg — AB (ref 26.0–34.0)
MCHC: 32.9 g/dL (ref 30.0–36.0)
MCV: 124 fL — ABNORMAL HIGH (ref 78.0–100.0)
PLATELETS: 251 10*3/uL (ref 150–400)
RBC: 1.96 MIL/uL — ABNORMAL LOW (ref 3.87–5.11)
RDW: 21.9 % — AB (ref 11.5–15.5)
WBC: 7.2 10*3/uL (ref 4.0–10.5)

## 2018-01-22 LAB — BASIC METABOLIC PANEL
Anion gap: 9 (ref 5–15)
BUN: 5 mg/dL — AB (ref 6–20)
CALCIUM: 7.8 mg/dL — AB (ref 8.9–10.3)
CHLORIDE: 110 mmol/L (ref 101–111)
CO2: 19 mmol/L — ABNORMAL LOW (ref 22–32)
CREATININE: 0.65 mg/dL (ref 0.44–1.00)
GFR calc Af Amer: >60 mL/min (ref >60)
GFR calc non Af Amer: >60 mL/min (ref >60)
Glucose, Bld: 97 mg/dL (ref 65–99)
Potassium: 3.6 mmol/L (ref 3.5–5.1)
Sodium: 138 mmol/L (ref 135–145)

## 2018-01-22 LAB — CK: CK TOTAL: 62 U/L (ref 38–234)

## 2018-01-22 LAB — MAGNESIUM: Magnesium: 1.8 mg/dL (ref 1.7–2.4)

## 2018-01-22 LAB — T3: T3 TOTAL: 84 ng/dL (ref 71–180)

## 2018-01-22 LAB — VITAMIN B6: Vitamin B6: 1.8 ug/L — ABNORMAL LOW (ref 2.0–32.8)

## 2018-01-22 MED ORDER — DEXMEDETOMIDINE HCL 200 MCG/2ML IV SOLN
0.4000 ug/kg/h | INTRAVENOUS | Status: DC
  Administered 2018-01-22: 1 ug/kg/h via INTRAVENOUS
  Administered 2018-01-22: 0.5 ug/kg/h via INTRAVENOUS
  Filled 2018-01-22: qty 2

## 2018-01-22 MED ORDER — PYRIDOXINE HCL 100 MG/ML IJ SOLN
100.0000 mg | Freq: Every day | INTRAMUSCULAR | Status: DC
  Administered 2018-01-22 – 2018-02-01 (×11): 100 mg via INTRAVENOUS
  Filled 2018-01-22 (×11): qty 1

## 2018-01-22 MED ORDER — CHLORDIAZEPOXIDE HCL 25 MG PO CAPS: 25.0000 mg | ORAL_CAPSULE | Freq: Once | ORAL | Status: DC

## 2018-01-22 MED ORDER — LORAZEPAM 1 MG PO TABS
1.0000 mg | ORAL_TABLET | Freq: Once | ORAL | Status: AC
  Administered 2018-01-22: 1 mg via ORAL
  Filled 2018-01-22: qty 1

## 2018-01-22 MED ORDER — RISPERIDONE 2 MG PO TABS
2.0000 mg | ORAL_TABLET | Freq: Every day | ORAL | Status: DC
  Administered 2018-01-23 – 2018-01-31 (×9): 2 mg via ORAL
  Filled 2018-01-22 (×11): qty 1

## 2018-01-22 MED ORDER — DEXMEDETOMIDINE HCL IN NACL 200 MCG/50ML IV SOLN
0.4000 ug/kg/h | INTRAVENOUS | Status: DC
  Administered 2018-01-22: 0.4 ug/kg/h via INTRAVENOUS
  Administered 2018-01-23 (×2): 0.3 ug/kg/h via INTRAVENOUS
  Filled 2018-01-22 (×3): qty 50

## 2018-01-22 MED ORDER — ADULT MULTIVITAMIN W/MINERALS CH
1.0000 | ORAL_TABLET | Freq: Every day | ORAL | Status: DC
  Administered 2018-01-22 – 2018-02-01 (×11): 1 via ORAL
  Filled 2018-01-22 (×11): qty 1

## 2018-01-22 MED ORDER — LORAZEPAM 2 MG/ML IJ SOLN
2.0000 mg | INTRAMUSCULAR | Status: DC | PRN
  Administered 2018-01-22 – 2018-01-24 (×9): 2 mg via INTRAVENOUS
  Administered 2018-01-25: 3 mg via INTRAVENOUS
  Administered 2018-01-25: 2 mg via INTRAVENOUS
  Filled 2018-01-22: qty 2
  Filled 2018-01-22 (×2): qty 1
  Filled 2018-01-22: qty 2
  Filled 2018-01-22 (×5): qty 1
  Filled 2018-01-22: qty 2
  Filled 2018-01-22: qty 1

## 2018-01-22 NOTE — Consult Note (Signed)
PULMONARY / CRITICAL CARE MEDICINE   Name: Debbie PatienceHolly Mantel MRN: [id number] DOB: 09-05-1971    ADMISSION DATE:  01/20/2018 CONSULTATION DATE:  01/22/2018  REFERRING MD:  [facility]  CHIEF COMPLAINT: Waxing and waning mental status with hallucinations  HISTORY OF PRESENT ILLNESS:        Patient is a 47 year old with bipolar disorder who was admitted complaining of lower extremity weakness and dysesthesias.  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  She is hyperreflexic and has been started on IgG for presumed Guillain-Barr syndrome.  Remarkably her spinal fluid is a protein of only 30.  It was a traumatic tap but there were only 2 white cells on the second sample accounted. Today she has been experiencing difficulties with hallucinations talking to people who are not present and being intermittently agitated at one point today she was shouting at her hallucinations.  She seems to be somewhat ataxic.  Her significant other tells me that she had a similar episode when she first presented with her bipolar disease but since she has been on the combination of Lamictal and Risperdal she has not had any further difficulties with hallucinations.  He does tell me that she drinks at least 3 glasses of wine a week and reports that she is gotten shaky when she is stopped drinking in the past.  She did have a spinal injection for back pain approximately week ago but has not been having any fevers or chills.  As noted the MRI was not was remarkable for an epidural process.  PAST MEDICAL HISTORY :  She  has a past medical history of Bipolar disorder (HCC), Depression, Genital herpes, and Obesity.  PAST SURGICAL HISTORY: She  has a past surgical history that includes Gastric bypass (N/A, 2007) and Ankle fracture surgery (Right).  No Known Allergies  No current facility-administered medications on  file prior to encounter.    Current Outpatient Medications on File Prior to Encounter  Medication Sig  . lamoTRIgine (LAMICTAL) 100 MG tablet Take 100 mg by mouth daily.  . risperiDONE (RISPERDAL) 0.5 MG tablet Take 0.5 mg by mouth at bedtime.    FAMILY HISTORY:  Her is adopted.    SOCIAL HISTORY: She  reports that  has never smoked. she has never used smokeless tobacco. She reports that she drinks alcohol. She reports that she does not use drugs.  REVIEW OF SYSTEMS:   Not directly obtainable  SUBJECTIVE:  As above  VITAL SIGNS: BP 126/89 (BP Location: Right Arm)   Pulse (!) 122   Temp 99 F (37.2 C) (Axillary)   Resp (!) 26   Ht 5\' 7"  (1.702 m)   Wt 238 lb 12.1 oz (108.3 kg)   SpO2 100%   BMI 37.39 kg/m   HEMODYNAMICS:    VENTILATOR SETTINGS:    INTAKE / OUTPUT: I/O last 3 completed shifts: In: 3836.7 [P.O.:720; I.V.:3116.7] Out: 600 [Urine:600]  PHYSICAL EXAMINATION: General: This is an obviously agitated individual with waxing and waning episodes of agitation moving about the bed.  She is not overtly hallucinating at the time of my examination.  She does have some intermittent episodes of diffuse shakes but she is able to speak to me during those episodes. Neuro: She is remarkably oriented x3.  Pupils are equal and EOMs are full, the face is symmetric.  She has normal strength in the upper extremities.  She is areflexic in the lower extremities. Cardiovascular: S1 and S2 are regular without murmur rub or gallop Lungs: Respirations are unlabored there is symmetric air movement a few scattered rhonchi and no wheezes Abdomen: The abdomen is slightly obese and soft without any organomegaly masses tenderness guarding or rebound  LABS:  BMET Recent Labs  Lab 01/20/18 1153 01/21/18 0349 01/22/18 0502  NA 138 138 138  K 3.5 3.7 3.6  CL 105 107 110  CO2 20* 21* 19*  BUN 6 6 5*  CREATININE 0.77 0.67 0.65  GLUCOSE 132* 97 97    Electrolytes Recent Labs   Lab 01/20/18 0218 01/20/18 1153 01/21/18 0349 01/22/18 0502  CALCIUM 8.1* 8.0* 7.7* 7.8*  MG 1.9  --   --  1.8  PHOS  --  2.0* 2.2* 2.4*    CBC Recent Labs  Lab 01/19/18 1924 01/21/18 0349 01/22/18 0502  WBC 9.5 7.2 7.2  HGB 9.5* 8.2* 8.0*  HCT 27.6* 24.7* 24.3*  PLT 331 279 251    Coag's No results for input(s): APTT, INR in the last 168 hours.  Sepsis Markers No results for input(s): LATICACIDVEN, PROCALCITON, O2SATVEN in the last 168 hours.  ABG No results for input(s): PHART, PCO2ART, PO2ART in the last 168 hours.  Liver Enzymes Recent Labs  Lab 01/21/18 0349  AST 125*  ALT 44  ALKPHOS 190*  BILITOT 3.0*  ALBUMIN 2.4*    Cardiac Enzymes No results for input(s): TROPONINI, PROBNP in the last 168 hours.  Glucose Recent Labs  Lab 01/22/18 1534  GLUCAP 114*    Imaging No results found.    DISCUSSION:      47 year old with long-standing history of bipolar disorder which is been stable from medications who presented with lower extremity weakness and dysesthesias.  She was also hyporeflexic.  She is being treated with IgG for potential Guillain-Barr syndrome.  She has subsequently developed acute delirium 2 days into her hospital course and in speaking with her significant other I find that she has difficulties with getting shaky when she stops drinking.  I suspect a much heavier than reported alcohol intake and will be transferred referring her to the intensive care unit for treatment with Precedex for presumed alcohol withdrawal but keeping in mind that other metabolic and infectious complications may be contributing to her alterations in mental status and that there may be some functional component underlying all of her behaviors.  Plan to keep her combination of medicines for bipolar disease constant as apparently it has been effective for many years.  ASSESSMENT / PLAN:  PULMONARY A: I perceive no acute pulmonary problems at present.  His good upper  extremity strength and I am not concerned that we may need to intubate this patient for her Guillain-Barr syndrome  CARDIOVASCULAR A: She is having some autonomic instability I suspect on the basis of alcohol withdrawal not Guillain-Barr, these should subside with the administration of Precedex.    GASTROINTESTINAL A: She has a prior history of gastric bypass surgery and has a low serum B12 and B6.  These are being repleted.  HEMATOLOGIC A: Interestingly she has an extraordinarily high MCV I suspect is a combination of alcohol and nutritional deficiencies.  These can be artifactually seen with disease as a cause red cell clumping such as cold agglutinin disease but I do not suspect such a phenomenon at present.  INFECTIOUS A: No indication of active infection at this time.  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  She is going to continue the IV IgG for now B12 replacement and B6 replacement. She is clearly delirious with some autonomic instability.  Although the autonomic instability can be related to Guillain-Barr I am more concerned that this represents alcohol withdrawal and will transfer her to the ICU for the administration of IV Precedex.  Her significant other states that her bipolar disease has been stable for many years on her current combination of Lamictal and Risperdal and I will not be changing those agents.  I do not perceive an infectious contribution to her altered mental state, nor with her waxing and waning do I suspect seizures as she was speaking to me during 1 of her episodes of tonic-clonic activity.  35 minutes was spent in the care  of this patient today  Penny Pia, MD Pulmonary and Critical Care Medicine [facility] Pager: [phone]  01/22/2018, 5:48 PM

## 2018-01-22 NOTE — NC FL2 (Addendum)
[place] MEDICAID FL2 LEVEL OF CARE SCREENING TOOL     IDENTIFICATION  Patient Name: Debbie Golden Birthdate: 12-Oct-1971 Sex: female Admission Date (Current Location): 01/20/2018  [facility] and [place] Number:  Chiropodist and Address:  [facility], [address], [place], [place] [address]      Provider Number: [id number]  Attending Physician Name and Address:  Edsel Petrin, DO  Relative Name and Phone Number:  Retia Passe, significant other, [phone]    Current Level of Care: Hospital Recommended Level of Care: Skilled Nursing Facility Prior Approval Number:    Date Approved/Denied:   PASRR Number: [id number]  Discharge Plan: SNF    Current Diagnoses: Patient Active Problem List   Diagnosis Date Noted  . Leg weakness, bilateral 01/20/2018  . Obesity 01/20/2018  . Bipolar disorder (HCC) 01/20/2018  . Macrocytic anemia 01/20/2018  . Acute hypokalemia 01/20/2018  . Alcohol use 01/20/2018  . Guillain Barr syndrome (HCC)   . Catatonia associated with another mental disorder 11/10/2016  . Noncompliance 11/10/2016  . Involuntary commitment 11/10/2016  . GERD (gastroesophageal reflux disease) 06/25/2015  . Bipolar disorder, curr episode mixed, severe, with psychotic features (HCC) 06/22/2015  . Bipolar disorder (manic depression) (HCC) 06/21/2015  . Hypokalemia 06/21/2015  . Delirium, acute 06/19/2015  . Major depression 06/19/2015  . Alcohol abuse 06/19/2015    Orientation RESPIRATION BLADDER Height & Weight     Self, Place  Normal Continent Weight: 238 lb 12.1 oz (108.3 kg) Height:  5\' 7"  (170.2 cm)  BEHAVIORAL SYMPTOMS/MOOD NEUROLOGICAL BOWEL NUTRITION STATUS      Continent Diet(please see DC summary)  AMBULATORY STATUS COMMUNICATION OF NEEDS Skin   Extensive Assist Verbally Normal                       Personal Care Assistance Level of Assistance  Bathing, Feeding, Dressing Bathing Assistance: Maximum  assistance Feeding assistance: Maximum assistance Dressing Assistance: Maximum assistance     Functional Limitations Info  Sight, Hearing, Speech Sight Info: Adequate Hearing Info: Adequate Speech Info: Adequate    SPECIAL CARE FACTORS FREQUENCY  PT (By licensed PT), OT (By licensed OT)     PT Frequency: 5x/week OT Frequency: 5x/week            Contractures Contractures Info: Not present    Additional Factors Info  Code Status, Allergies, Psychotropic Code Status Info: Full Allergies Info: No Known Allergies Psychotropic Info: risperdal         Current Medications (01/22/2018):  This is the current hospital active medication list Current Facility-Administered Medications  Medication Dose Route Frequency Provider Last Rate Last Dose  . acetaminophen (TYLENOL) tablet 650 mg  650 mg Oral Q6H PRN Russella Dar, NP       Or  . acetaminophen (TYLENOL) suppository 650 mg  650 mg Rectal Q6H PRN Russella Dar, NP      . aspirin EC tablet 325 mg  325 mg Oral Daily Russella Dar, NP   325 mg at 01/22/18 0920  . dextrose 5 % and 0.9 % NaCl with KCl 20 mEq/L infusion   Intravenous Continuous Russella Dar, NP 100 mL/hr at 01/22/18 0012    . enoxaparin (LOVENOX) injection 40 mg  40 mg Subcutaneous Q24H Russella Dar, NP   40 mg at 01/21/18 2052  . folic acid (FOLVITE) tablet 1 mg  1 mg Oral Daily Russella Dar, NP   1 mg at 01/22/18  0920  . Immune Globulin 10% (PRIVIGEN) IV infusion 40 g  400 mg/kg Intravenous Q24 Hr x 5 Milon DikesArora, Ashish, MD   Stopped at 01/22/18 0011  . lamoTRIgine (LAMICTAL) tablet 100 mg  100 mg Oral Daily Russella DarEllis, Allison L, NP   100 mg at 01/22/18 0920  . LORazepam (ATIVAN) injection 2-3 mg  2-3 mg Intravenous Q1H PRN Edsel PetrinMikhail, Maryann, DO   2 mg at 01/22/18 0920  . multivitamin with minerals tablet 1 tablet  1 tablet Oral Daily Edsel PetrinMikhail, Maryann, DO   1 tablet at 01/22/18 0920  . ondansetron (ZOFRAN) tablet 4 mg  4 mg Oral Q6H PRN Russella DarEllis, Allison L, NP        Or  . ondansetron [facility](ZOFRAN) injection 4 mg  4 mg Intravenous Q6H PRN Russella DarEllis, Allison L, NP      . risperiDONE (RISPERDAL) tablet 0.5 mg  0.5 mg Oral QHS Russella DarEllis, Allison L, NP   0.5 mg at 01/21/18 2052  . thiamine (VITAMIN B-1) tablet 100 mg  100 mg Oral Daily Russella DarEllis, Allison L, NP   100 mg at 01/22/18 0920  . traMADol (ULTRAM) tablet 50 mg  50 mg Oral Q6H PRN Russella DarEllis, Allison L, NP   50 mg at 01/22/18 [id number]     Discharge Medications: Please see discharge summary for a list of discharge medications.  Relevant Imaging Results:  Relevant Lab Results:   Additional Information SSN: [id number]  Abigail ButtsSusan Frieda Arnall, LCSW

## 2018-01-22 NOTE — Procedures (Signed)
Patient not able to perform a FVC or NIF given continuous Precedex infusion.  Will continue to monitor patient for ability to perform these maneuvers.

## 2018-01-22 NOTE — Progress Notes (Signed)
PROGRESS NOTE    Debbie Golden  GLO:[id number] DOB: [birth year] DOA: 01/20/2018 PCP: Patient, No Pcp Per   Chief Complaint  Patient presents with  . Knee Pain  . Weakness    Brief Narrative:  HPI on 01/20/2018 by Ms. Erin Hearing, NP Debbie Golden is a 47 y.o. female with medical history significant for bipolar disorder with history of catatonia in 2017, obesity, macrocytic anemia, history of genital herpes, and regular alcohol use.  Patient presented to [facility] on the afternoon of 1/22 reporting pain and numbness in both legs greater in the left leg ongoing for a week.  She reports chronic back problems and had a steroid injection 1 week prior.  She reported increased difficulty with ambulation at home and no recent trauma.  When I questioned the patient she reports she has been having difficulty ambulating and weakness/numbness/tingling in the legs waxing and waning for 1 month.  CT of the head was unremarkable.  Due to concerns of possible Guillain- Barr patient was subsequently transferred to this facility so she can undergo a lumbar puncture.  After arrival to [facility] she was evaluated by neurology who documented on exam patient had absent generalized DTRs, mild bilateral lower extremity motor weakness averaging 4/5 from the hips down with decreased wrist flexion, extension, abduction bilaterally at 4/5 as well.  She was not exhibiting any respiratory symptoms or signs of bowel or bladder incontinence or urinary retention.  MRI of the cervical and thoracic spine did not reveal explanation for patient's current symptoms.  MR lumbar spine pending.  Subsequent review of CSF is not consistent with Guillian-Barr.  She will be admitted to the stepdown unit for further monitoring and treatment.  Interim history Neurology consulted, recommended 5 days for IVIG.  Assessment & Plan   Bilateral lower extremity weakness, distal upper extremity weakness -Neurology consulted and appreciated, suspecting possible  Ethelene Hal -MRI cervical and thoracic spine normal -MRI lumbar spine, lower thoracic spinal cord and cauda equina. No evidence of intradural inflammation. Chronic lumbar disc degeneration L5-S1. Small lumbar disc herniation L1-L2. -CSF - LP was a traumatic tap. Reviewed with neurology, no pleocytosis, protein was not elevated -Neurology consulted and appreciated, recommended 5 days IVIG- today is day 3/5 -PT, OT consulted and appreciated- recommended SNF. Social work consulted -CRP 2.4, ESR 27  Hypokalemia -Resolved, K monitor BMP  Hypophosphatemia -Replacing, continue to monitor   Macrocytic anemia  -Baseline hemoglobin approximately 9-10, currently -Anemia panel showed normal iron, low folate, B-12 in the low normal range -Folate being replaced, patient was also given 1 dose of 5 and be 12 AM -Vitamin B 6 level 1.8- will replace -hemoglobin currently 8  Alcohol use/Withdrawal -Patient inconsistent regarding alcohol intake. Reports of approximate 2 drinks every other day versus daily. -patient appears to be confused this morning -will start CIWA (SDU) protocol -continue thiamine, folic acid, multivitamin  Obesity  -History of gastric bypass  Bipolar disorder -Continue Lamictal, Risperdal -will consult psychiatry   Abnormal TSH -TSH 4.753, free T4 1.15 -Would recheck thyroid function test in 4-6 weeks. Will speak with endocrinology  DVT Prophylaxis  Lovenox  Code Status: Full  Family Communication: None at bedside  Disposition Plan: Admitted.  Consultants Neurology  Psychiatry  Procedures  Lumbar puncture  Antibiotics   Anti-infectives (From admission, onward)   None      Subjective:   Debbie Golden seen and examined today.  Continues to have leg pain. (appears confused this morning- tells me she is visiting and not a  student). Admits to feeling anxious.   Objective:   Vitals:   01/21/18 2101 01/21/18 2230 01/22/18 0338 01/22/18 0732  BP: (!) 133/96  123/82 125/87 (!) 139/96  Pulse: (!) 112 (!) 117 (!) 116   Resp: (!) 28 (!) 24 (!) 30   Temp: 98.6 F (37 C) 98.4 F (36.9 C) 98.6 F (37 C) 99 F (37.2 C)  TempSrc: Oral Oral Oral Oral  SpO2: 98% 98% 99% 99%  Weight:   108.3 kg (238 lb 12.1 oz)   Height:        Intake/Output Summary (Last 24 hours) at 01/22/2018 1050 Last data filed at 01/22/2018 0300 Gross per 24 hour  Intake 3596.72 ml  Output 600 ml  Net 2996.72 ml   Filed Weights   01/20/18 1124 01/21/18 0400 01/22/18 0338  Weight: 100.3 kg (221 lb 1.9 oz) 106.4 kg (234 lb 9.1 oz) 108.3 kg (238 lb 12.1 oz)    Exam  General: Well developed, well nourished, NAD, appears stated age  HEENT: NCAT, mucous membranes moist.   Cardiovascular: S1 S2 auscultated, tachycardic, no murmurs  Respiratory: Clear to auscultation bilaterally with equal chest rise, no wheezing   Abdomen: Soft, nontender, nondistended, + bowel sounds  Extremities: warm dry without cyanosis clubbing or edema  Neuro: AAOx2 (self, place), nonfocal. B/L wrist strength 3-4/5, LE strength 4/5  Psych: Anxious   Data Reviewed: I have personally reviewed following labs and imaging studies  CBC: Recent Labs  Lab 01/19/18 1924 01/21/18 0349 01/22/18 0502  WBC 9.5 7.2 7.2  NEUTROABS 7.4*  --   --   HGB 9.5* 8.2* 8.0*  HCT 27.6* 24.7* 24.3*  MCV 121.9* 122.9* 124.0*  PLT 331 279 979   Basic Metabolic Panel: Recent Labs  Lab 01/19/18 1924 01/20/18 0218 01/20/18 1153 01/21/18 0349 01/22/18 0502  NA 138 138 138 138 138  K 2.6* 2.9* 3.5 3.7 3.6  CL 100* 103 105 107 110  CO2 27 23 20* 21* 19*  GLUCOSE 116* 101* 132* 97 97  BUN 9 9 6 6  5*  CREATININE 0.60 0.64 0.77 0.67 0.65  CALCIUM 8.4* 8.1* 8.0* 7.7* 7.8*  MG  --  1.9  --   --  1.8  PHOS  --   --  2.0* 2.2* 2.4*   GFR: Estimated Creatinine Clearance: 110.2 mL/min (by C-G formula based on SCr of 0.65 mg/dL). Liver Function Tests: Recent Labs  Lab 01/21/18 0349  AST 125*  ALT 44    ALKPHOS 190*  BILITOT 3.0*  PROT 6.6  ALBUMIN 2.4*   No results for input(s): LIPASE, AMYLASE in the last 168 hours. No results for input(s): AMMONIA in the last 168 hours. Coagulation Profile: No results for input(s): INR, PROTIME in the last 168 hours. Cardiac Enzymes: No results for input(s): CKTOTAL, CKMB, CKMBINDEX, TROPONINI in the last 168 hours. BNP (last 3 results) No results for input(s): PROBNP in the last 8760 hours. HbA1C: No results for input(s): HGBA1C in the last 72 hours. CBG: No results for input(s): GLUCAP in the last 168 hours. Lipid Profile: No results for input(s): CHOL, HDL, LDLCALC, TRIG, CHOLHDL, LDLDIRECT in the last 72 hours. Thyroid Function Tests: Recent Labs    01/20/18 0733 01/21/18 0349  TSH 4.753*  --   FREET4  --  1.15*   Anemia Panel: Recent Labs    01/20/18 0733  VITAMINB12 401  FOLATE 2.4*  FERRITIN 341*  TIBC 153*  IRON 84  RETICCTPCT 6.2*   Urine analysis:  Component Value Date/Time   COLORURINE AMBER (A) 01/19/2018 2058   APPEARANCEUR HAZY (A) 01/19/2018 2058   LABSPEC 1.013 01/19/2018 2058   PHURINE 6.0 01/19/2018 2058   GLUCOSEU NEGATIVE 01/19/2018 2058   HGBUR NEGATIVE 01/19/2018 2058   BILIRUBINUR NEGATIVE 01/19/2018 2058   KETONESUR NEGATIVE 01/19/2018 2058   PROTEINUR NEGATIVE 01/19/2018 2058   NITRITE NEGATIVE 01/19/2018 2058   LEUKOCYTESUR NEGATIVE 01/19/2018 2058   Sepsis Labs: @LABRCNTIP (procalcitonin:4,lacticidven:4)  ) Recent Results (from the past 240 hour(s))  CSF culture     Status: None (Preliminary result)   Collection Time: 01/20/18  3:55 AM  Result Value Ref Range Status   Specimen Description CSF  Final   Special Requests NONE  Final   Gram Stain   Final    WBC PRESENT, PREDOMINANTLY MONONUCLEAR NO ORGANISMS SEEN CYTOSPIN SMEAR    Culture NO GROWTH 2 DAYS  Final   Report Status PENDING  Incomplete  MRSA PCR Screening     Status: None   Collection Time: 01/20/18 11:33 AM  Result  Value Ref Range Status   MRSA by PCR NEGATIVE NEGATIVE Final    Comment:        The GeneXpert MRSA Assay (FDA approved for NASAL specimens only), is one component of a comprehensive MRSA colonization surveillance program. It is not intended to diagnose MRSA infection nor to guide or monitor treatment for MRSA infections.       Radiology Studies: Mr Lumbar Spine W Wo Contrast  Result Date: 01/20/2018 CLINICAL DATA:  47 year old female with progressive ascending weakness and numbness. EXAM: MRI LUMBAR SPINE WITHOUT AND WITH CONTRAST TECHNIQUE: Multiplanar and multiecho pulse sequences of the lumbar spine were obtained without and with intravenous contrast. CONTRAST:  40m MULTIHANCE GADOBENATE DIMEGLUMINE 529 MG/ML IV SOLN COMPARISON:  Cervical and thoracic spine MRI 0534 hr today. Lumbar puncture images 0 347 hr today. FINDINGS: Segmentation: Normal lumbar segmentation demonstrated on the LP images. This numbering appears concordant with the cervical and thoracic spine numbering earlier today. Alignment:  Preserved lumbar lordosis. Vertebrae: Chronic degenerative endplate marrow signal changes at L5-S1. Elsewhere Visualized bone marrow signal is within normal limits. No marrow edema or evidence of acute osseous abnormality. Intact visible sacrum and SI joints. Conus medullaris and cauda equina: Conus extends to the T12-L1 level. Normal conus. No abnormal intradural enhancement. No thickening of the cauda equina, nerve roots appear normal. Paraspinal and other soft tissues: Negative visualized abdominal viscera. Posterior paraspinal soft tissues are within normal limits; there is a degree of diffuse paraspinal muscle atrophy. Disc levels: T12-L1:  Negative. L1-L2: Small central to slightly left paracentral disc herniation (series 6, image 10). No associated stenosis. L2-L3:  Mild facet hypertrophy.  No stenosis. L3-L4:  Mild facet hypertrophy.  No stenosis. L4-L5:  Mild facet hypertrophy.  No  stenosis. L5-S1: Advanced disc space loss. Probable vacuum disc. Circumferential disc osteophyte complex with broad-based posterior component. Mild facet hypertrophy. Mild epidural lipomatosis. No significant spinal stenosis or convincing lateral recess stenosis. There is moderate left and mild right L5 neural foraminal stenosis. IMPRESSION: 1. Normal lower thoracic spinal cord and cauda equina. No evidence of intradural inflammation. 2. Advanced chronic lumbar disc degeneration at L5-S1. No associated spinal stenosis, but mild to moderate L5 neural foraminal stenosis greater on the left. 3. Small lumbar disc herniation at L1-L2 without convincing neural impingement. 4.  No acute osseous abnormality. Electronically Signed   By: HGenevie AnnM.D.   On: 01/20/2018 11:46     Scheduled Meds: .  aspirin EC  325 mg Oral Daily  . enoxaparin (LOVENOX) injection  40 mg Subcutaneous Q24H  . folic acid  1 mg Oral Daily  . lamoTRIgine  100 mg Oral Daily  . multivitamin with minerals  1 tablet Oral Daily  . risperiDONE  0.5 mg Oral QHS  . thiamine  100 mg Oral Daily   Continuous Infusions: . dextrose 5 % and 0.9 % NaCl with KCl 20 mEq/L 100 mL/hr at 01/22/18 0012  . Immune Globulin 10% Stopped (01/22/18 0011)     LOS: 2 days   Time Spent in minutes   45 minutes  Westlee Devita D.O. on 01/22/2018 at 10:50 AM  Between 7am to 7pm - Pager - [phone]  After 7pm go to [email or web address] - password [email or web address]  And look for the night coverage person covering for me after hours  [facility] Office  [phone]

## 2018-01-22 NOTE — Progress Notes (Signed)
Subjective: Patient currently laying in bed she knows her name, month and then starts talking nonsensically.  Follows no commands.  He has obvious murmurs in arms and legs and appears uncomfortable.  Heart rate in the 130s.  Exam: Vitals:   01/22/18 1151 01/22/18 1401  BP: (!) 119/96   Pulse:  (!) (P) 144  Resp:    Temp: 98.2 F (36.8 C)   SpO2: 99%     Physical Exam   HEENT-  Normocephalic, no lesions, without obvious abnormality.  Normal external eye and conjunctiva.   Cardiovascular- S1-S2 audible, pulses palpable throughout   Lungs-no rhonchi or wheezing noted, no excessive working breathing.  Saturations within normal limits Abdomen- All 4 quadrants palpated and nontender Extremities- Warm, mildly diaphoretic and intact Musculoskeletal-no joint tenderness, deformity or swelling Skin-warm and dry, no hyperpigmentation, vitiligo, or suspicious lesions    Neuro:  Mental Status: Patient is awake, knows her name, knows the month however then starts talking nonsensically, does not follow any commands, appears uncomfortable. Cranial Nerves: II:  Visual fields grossly normal,  III,IV, VI: ptosis not present, extra-ocular motions intact bilaterally pupils equal, round, reactive to light and accommodation V,VII: smile symmetric, facial light touch sensation normal bilaterally VIII: hearing normal bilaterally IX,X: uvula rises symmetrically XI: bilateral shoulder shrug XII: midline tongue extension Motor: Moving all extremities with bilateral upper and lower extremity tremor Sensory: Pinprick and light touch intact throughout, bilaterally     Medications:  Scheduled: . aspirin EC  325 mg Oral Daily  . enoxaparin (LOVENOX) injection  40 mg Subcutaneous Q24H  . folic acid  1 mg Oral Daily  . lamoTRIgine  100 mg Oral Daily  . multivitamin with minerals  1 tablet Oral Daily  . pyridOXINE  100 mg Intravenous Daily  . risperiDONE  0.5 mg Oral QHS  . thiamine  100 mg Oral Daily    Continuous: . dextrose 5 % and 0.9 % NaCl with KCl 20 mEq/L 100 mL/hr at 01/22/18 0012  . Immune Globulin 10% Stopped (01/22/18 0011)   YTK:[id number] **OR** acetaminophen, LORazepam, ondansetron **OR** ondansetron (ZOFRAN) IV, traMADol  Pertinent Labs/Diagnostics: None  No results found.   Etta Quill PA-C [facility] [phone]  Impression: Patientadmitted for presumptive treatment of Guillain-Barr and receiving IVIG.  Today is day 3 and patient is now showing delirium tremens.   Patient is mildly diaphoretic, talking nonsensically, has a heart rate of 130, has bilateral upper and lower extremity tremors.   Currently she is received 4 mg of Ativan and is on the CIWA protocol.  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   Do not feel as though this is a IDP at this time.  Recommendations: 1) CIWA protocol 2) Stop IVIG for now 3) Continue Thiamine and B6 per primary team as you are. We will follow with you.  01/22/2018, 2:02 PM  Attending Neurohospitalist Addendum Patient seen and examined with APP/Resident. Agree with the history and physical as documented above. Agree with the plan as documented, which I helped formulate. I have independently reviewed the chart, obtained history, review of systems and examined the patient.I have personally reviewed pertinent head/neck/spine imaging (CT/MRI). Please feel free to call with any questions. --- Amie Portland, MD [facility] Pager: [phone]  If 7pm to 7am, please call on call as listed on AMION.

## 2018-01-22 NOTE — Consult Note (Addendum)
[place] [facility] Consult   Reason for Consult:  Altered mental status Referring Physician:  Dr. Ree Kida  Patient Identification: Debbie Golden MRN:  [id number] Principal Diagnosis: Altered mental status Diagnosis:   Patient Active Problem List   Diagnosis Date Noted  . Leg weakness, bilateral [R29.898] 01/20/2018  . Obesity [E66.9] 01/20/2018  . Bipolar disorder () [F31.9] 01/20/2018  . Macrocytic anemia [D53.9] 01/20/2018  . Acute hypokalemia [E87.6] 01/20/2018  . Alcohol use [Z78.9] 01/20/2018  . Guillain Barr syndrome ([place]) [G61.0]   . 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 Noncompliance [Z91.19] 11/10/2016  . Involuntary commitment [Z04.6] 11/10/2016  . GERD (gastroesophageal reflux disease) [K21.9] 06/25/2015  . Bipolar disorder, curr episode mixed, severe, with psychotic features ([place]) [F31.64] 06/22/2015  . Bipolar disorder (manic depression) ([place]) [F31.9] 06/21/2015  . Hypokalemia [E87.6] 06/21/2015  . Delirium, acute [R41.0] 06/19/2015  . Major depression [F32.9] 06/19/2015  . Alcohol abuse [F10.10] 06/19/2015    Total Time spent with patient: 1 hour  Subjective:   Debbie Golden is a 47 y.o. female patient admitted with bilateral lower extremity weakness and distal upper extremity weakness.  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 She is receiving IVIG. She has a history of alcohol use and is receiving treatment for alcohol withdrawal. She reports having 2 drinks every couple days but has been inconsistent with her quantity of alcohol intake. UDS was positive for benzodiazepines on admission. She has a history of bipolar disorder. She takes Lamictal 100 mg daily and Risperdal 0.5 mg qhs. She has been evaluated by PT with recommendations for SNF.   Of note, patient was seen in November 2017 and admitted to [facility] for acute catatonia in the setting  of poor medication compliance. Her medications were restarted prior to transfer to OSH.   On interview, Debbie Golden was poorly inattentive and demonstrated impaired consciousness throughout the interview. She was only oriented to self and reported that today was 07/16/1992 and later changed the year to 2013. She was unaware that she was in the hospital and reported that she was at her partner's house. She reported, "You have to do your chores" when asked about her gyrating movements. She reported that she is trying to decide what medications to take. She reports drinking two "13 oz" beers daily. She denies AVH.   Patient's partner, Debbie Golden was contacted with her consent. He reports that she was doing well prior to admission minus her neurological symptoms. She has been taking her medications. He reports that since hospitalization she has been hallucinating. She thinks she is walking when she is not. He reports that he last saw her in a confused state in 2017 when she was diagnosed with catatonia. She was like a "zombie." He is unaware of illicit substance use. She drinks alcohol but he only believes that she drinks 2-3 glasses of wine weekly.   Past Psychiatric History: Alcohol abuse and bipolar disorder.   Risk to Self: Is patient at risk for suicide?: No Risk to Others:  UTA due to AMS. Prior Inpatient Therapy:   UTA due to AMS. Per chart review, she completed rehab in the past for alcohol abuse.  Prior Outpatient Therapy:  Prior medications include Seroquel 150 mg qhs, Klonopin 0.5 mg BID and Trazodone 50 mg qhs.  Past Medical History:  Past Medical History:  Diagnosis Date  . Bipolar disorder ([place])   . Depression   . Genital  herpes   . Obesity     Past Surgical History:  Procedure Laterality Date  . ANKLE FRACTURE SURGERY Right   . GASTRIC BYPASS N/A 2007   Family History:  Family History  Adopted: Yes   Family Psychiatric  History: UTA due to AMS. Social History:  Social History    Substance and Sexual Activity  Alcohol Use Yes     Social History   Substance and Sexual Activity  Drug Use No    Social History   Socioeconomic History  . Marital status: Single    Spouse name: None  . Number of children: None  . Years of education: None  . Highest education level: None  Social Needs  . Financial resource strain: None  . Food insecurity - worry: None  . Food insecurity - inability: None  . Transportation needs - medical: None  . Transportation needs - non-medical: None  Occupational History  . None  Tobacco Use  . Smoking status: Never Smoker  . Smokeless tobacco: Never Used  Substance and Sexual Activity  . Alcohol use: Yes  . Drug use: No  . Sexual activity: None  Other Topics Concern  . None  Social History Narrative  . None   Additional Social History: UTA due to AMS.     Allergies:  No Known Allergies  Labs:  Results for orders placed or performed during the hospital encounter of 01/20/18 (from the past 48 hour(s))  Vitamin B6     Status: Abnormal   Collection Time: 01/20/18 11:53 AM  Result Value Ref Range   Vitamin B6 1.8 (L) 2.0 - 32.8 ug/L    Comment: (NOTE) This test was developed and its performance characteristics determined by [facility]. It has not been cleared or approved by the Food and Drug Administration. Performed At: [facility] [place], [place] [id number] Rush Farmer MD [id number]   Phosphorus     Status: Abnormal   Collection Time: 01/20/18 11:53 AM  Result Value Ref Range   Phosphorus 2.0 (L) 2.5 - 4.6 mg/dL  Basic metabolic panel     Status: Abnormal   Collection Time: 01/20/18 11:53 AM  Result Value Ref Range   Sodium 138 135 - 145 mmol/L   Potassium 3.5 3.5 - 5.1 mmol/L   Chloride 105 101 - 111 mmol/L   CO2 20 (L) 22 - 32 mmol/L   Glucose, Bld 132 (H) 65 - 99 mg/dL   BUN 6 6 - 20 mg/dL   Creatinine, Ser 0.77 0.44 - 1.00 mg/dL   Calcium 8.0 (L) 8.9 - 10.3 mg/dL   GFR calc  non Af Amer >60 >60 mL/min   GFR calc Af Amer >60 >60 mL/min    Comment: (NOTE) The eGFR has been calculated using the CKD EPI equation. This calculation has not been validated in all clinical situations. eGFR's persistently <60 mL/min signify possible Chronic Kidney Disease.    Anion gap 13 5 - 15  T4, free     Status: Abnormal   Collection Time: 01/21/18  3:49 AM  Result Value Ref Range   Free T4 1.15 (H) 0.61 - 1.12 ng/dL    Comment: (NOTE) Biotin ingestion may interfere with free T4 tests. If the results are inconsistent with the TSH level, previous test results, or the clinical presentation, then consider biotin interference. If needed, order repeat testing after stopping biotin.   Comprehensive metabolic panel     Status: Abnormal   Collection Time: 01/21/18  3:49 AM  Result Value Ref Range   Sodium 138 135 - 145 mmol/L   Potassium 3.7 3.5 - 5.1 mmol/L   Chloride 107 101 - 111 mmol/L   CO2 21 (L) 22 - 32 mmol/L   Glucose, Bld 97 65 - 99 mg/dL   BUN 6 6 - 20 mg/dL   Creatinine, Ser 0.67 0.44 - 1.00 mg/dL   Calcium 7.7 (L) 8.9 - 10.3 mg/dL   Total Protein 6.6 6.5 - 8.1 g/dL   Albumin 2.4 (L) 3.5 - 5.0 g/dL   AST 125 (H) 15 - 41 U/L   ALT 44 14 - 54 U/L   Alkaline Phosphatase 190 (H) 38 - 126 U/L   Total Bilirubin 3.0 (H) 0.3 - 1.2 mg/dL   GFR calc non Af Amer >60 >60 mL/min   GFR calc Af Amer >60 >60 mL/min    Comment: (NOTE) The eGFR has been calculated using the CKD EPI equation. This calculation has not been validated in all clinical situations. eGFR's persistently <60 mL/min signify possible Chronic Kidney Disease.    Anion gap 10 5 - 15  CBC     Status: Abnormal   Collection Time: 01/21/18  3:49 AM  Result Value Ref Range   WBC 7.2 4.0 - 10.5 K/uL   RBC 2.01 (L) 3.87 - 5.11 MIL/uL   Hemoglobin 8.2 (L) 12.0 - 15.0 g/dL   HCT 24.7 (L) 36.0 - 46.0 %   MCV 122.9 (H) 78.0 - 100.0 fL   MCH 40.8 (H) 26.0 - 34.0 pg   MCHC 33.2 30.0 - 36.0 g/dL   RDW 21.6 (H)  11.5 - 15.5 %   Platelets 279 150 - 400 K/uL  T3     Status: None   Collection Time: 01/21/18  3:49 AM  Result Value Ref Range   T3, Total 84 71 - 180 ng/dL    Comment: (NOTE) Performed At: [facility] [place], [place] [id number] Rush Farmer MD VH:[id number]   Phosphorus     Status: Abnormal   Collection Time: 01/21/18  3:49 AM  Result Value Ref Range   Phosphorus 2.2 (L) 2.5 - 4.6 mg/dL  Urine rapid drug screen (hosp performed)     Status: Abnormal   Collection Time: 01/21/18 10:45 AM  Result Value Ref Range   Opiates NONE DETECTED NONE DETECTED   Cocaine NONE DETECTED NONE DETECTED   Benzodiazepines POSITIVE (A) NONE DETECTED   Amphetamines NONE DETECTED NONE DETECTED   Tetrahydrocannabinol NONE DETECTED NONE DETECTED   Barbiturates NONE DETECTED NONE DETECTED    Comment: (NOTE) DRUG SCREEN FOR MEDICAL PURPOSES ONLY.  IF CONFIRMATION IS NEEDED FOR ANY PURPOSE, NOTIFY LAB WITHIN 5 DAYS. LOWEST DETECTABLE LIMITS FOR URINE DRUG SCREEN Drug Class                     Cutoff (ng/mL) Amphetamine and metabolites    1000 Barbiturate and metabolites    200 Benzodiazepine                 413 Tricyclics and metabolites     300 Opiates and metabolites        300 Cocaine and metabolites        300 THC                            50   CBC     Status: Abnormal   Collection Time: 01/22/18  5:02 AM  Result Value Ref Range   WBC 7.2 4.0 - 10.5 K/uL   RBC 1.96 (L) 3.87 - 5.11 MIL/uL   Hemoglobin 8.0 (L) 12.0 - 15.0 g/dL   HCT 24.3 (L) 36.0 - 46.0 %   MCV 124.0 (H) 78.0 - 100.0 fL   MCH 40.8 (H) 26.0 - 34.0 pg   MCHC 32.9 30.0 - 36.0 g/dL   RDW 21.9 (H) 11.5 - 15.5 %   Platelets 251 150 - 400 K/uL  Basic metabolic panel     Status: Abnormal   Collection Time: 01/22/18  5:02 AM  Result Value Ref Range   Sodium 138 135 - 145 mmol/L   Potassium 3.6 3.5 - 5.1 mmol/L   Chloride 110 101 - 111 mmol/L   CO2 19 (L) 22 - 32 mmol/L   Glucose, Bld 97 65 - 99  mg/dL   BUN 5 (L) 6 - 20 mg/dL   Creatinine, Ser 0.65 0.44 - 1.00 mg/dL   Calcium 7.8 (L) 8.9 - 10.3 mg/dL   GFR calc non Af Amer >60 >60 mL/min   GFR calc Af Amer >60 >60 mL/min    Comment: (NOTE) The eGFR has been calculated using the CKD EPI equation. This calculation has not been validated in all clinical situations. eGFR's persistently <60 mL/min signify possible Chronic Kidney Disease.    Anion gap 9 5 - 15  Phosphorus     Status: Abnormal   Collection Time: 01/22/18  5:02 AM  Result Value Ref Range   Phosphorus 2.4 (L) 2.5 - 4.6 mg/dL  Magnesium     Status: None   Collection Time: 01/22/18  5:02 AM  Result Value Ref Range   Magnesium 1.8 1.7 - 2.4 mg/dL    Current Facility-Administered Medications  Medication Dose Route Frequency Provider Last Rate Last Dose  . acetaminophen (TYLENOL) tablet 650 mg  650 mg Oral Q6H PRN Samella Parr, NP       Or  . acetaminophen (TYLENOL) suppository 650 mg  650 mg Rectal Q6H PRN Samella Parr, NP      . aspirin EC tablet 325 mg  325 mg Oral Daily Samella Parr, NP   325 mg at 01/22/18 0920  . dextrose 5 % and 0.9 % NaCl with KCl 20 mEq/L infusion   Intravenous Continuous Samella Parr, NP 100 mL/hr at 01/22/18 0012    . enoxaparin (LOVENOX) injection 40 mg  40 mg Subcutaneous Q24H Samella Parr, NP   40 mg at 01/21/18 2052  . folic acid (FOLVITE) tablet 1 mg  1 mg Oral Daily Samella Parr, NP   1 mg at 01/22/18 0920  . Immune Globulin 10% (PRIVIGEN) IV infusion 40 g  400 mg/kg Intravenous Q24 Hr x 5 Amie Portland, MD   Stopped at 01/22/18 0011  . lamoTRIgine (LAMICTAL) tablet 100 mg  100 mg Oral Daily Samella Parr, NP   100 mg at 01/22/18 0920  . LORazepam (ATIVAN) injection 2-3 mg  2-3 mg Intravenous Q1H PRN Cristal Ford, DO   2 mg at 01/22/18 0920  . multivitamin with minerals tablet 1 tablet  1 tablet Oral Daily Cristal Ford, DO   1 tablet at 01/22/18 0920  . ondansetron (ZOFRAN) tablet 4 mg  4 mg Oral Q6H  PRN Samella Parr, NP       Or  . ondansetron [facility]) injection 4 mg  4 mg Intravenous Q6H PRN Samella Parr, NP      .  pyridOXINE (B-6) injection 100 mg  100 mg Intravenous Daily Cristal Ford, DO      . risperiDONE (RISPERDAL) tablet 0.5 mg  0.5 mg Oral QHS Samella Parr, NP   0.5 mg at 01/21/18 2052  . thiamine (VITAMIN B-1) tablet 100 mg  100 mg Oral Daily Samella Parr, NP   100 mg at 01/22/18 0920  . traMADol (ULTRAM) tablet 50 mg  50 mg Oral Q6H PRN Samella Parr, NP   50 mg at 01/22/18 4098    Musculoskeletal: Strength & Muscle Tone: UTA due to AMS. Gait & Station: UTA due to patient lying in bed. Patient leans: N/A  Psychiatric Specialty Exam: Physical Exam  Nursing note and vitals reviewed. Constitutional: She appears well-developed and well-nourished.  HENT:  Head: Normocephalic and atraumatic.  Neck: Normal range of motion.  Respiratory: Effort normal.  Musculoskeletal: Normal range of motion.  Neurological:  Sedated  Psychiatric: Her affect is inappropriate. Her speech is slurred. She is slowed. Thought content is delusional. Cognition and memory are impaired. She expresses impulsivity. She is inattentive.    Review of Systems  Unable to perform ROS: Mental status change    Blood pressure (!) 139/96, pulse (!) 116, temperature 99 F (37.2 C), temperature source Oral, resp. rate (!) 26, height _0  (1.702 m), weight 108.3 kg (238 lb 12.1 oz), SpO2 99 %.Body mass index is 37.39 kg/m.  General Appearance: Disheveled, middle aged, African American female, wearing a hospital gown and lying in bed. NAD.   Eye Contact:  Poor  Speech:  Slow and Slurred  Volume:  Decreased  Mood:  Did not state  Affect:  Inappropriate  Thought Process:  Disorganized  Orientation:  Other:  Only oriented to self.  Thought Content:  Illogical and Delusions  Suicidal Thoughts:  UTA due to AMS.  Homicidal Thoughts:  UTA due to AMS.  Memory:  Immediate;   Poor Recent;    Poor Remote;   Poor  Judgement:  Impaired  Insight:  Lacking  Psychomotor Activity:  Increased due to restlessness.  Concentration:  Concentration: Poor and Attention Span: Poor  Recall:  Poor  Fund of Knowledge:  Poor  Language:  Poor  Akathisia:  NA  Handed:  Right  AIMS (if indicated):   N/A  Assets:  Housing Social Support  ADL's:  Impaired  Cognition:  Impaired due to AMS.   Sleep:   N/A   Assessment:  Debbie Golden is a 47 y.o. female was admitted with gradual weakness in her lower and upper extremities with concern for Guillain-Barre. She is receiving IVIG treatment. She was oriented on admission but has had increased confusion and disorientation since admission. There is concern for alcohol withdrawal although patient has reported conflicting information regarding her alcohol intake. Her lab work suggests alcohol abuse secondary to macrocytic anemia AST to ALT ratio seen with alcoholism. She is receiving treatment with Ativan but has been poorly responsive and may require a higher level of care. There is also concern for catatonia given prior history and in the setting of current medical condition although she has autonomic instability (tachycardia and tachypnea without fever) there are no other catatonic symptoms such as rigidity. Recommend checking CK level which would more likely support catatonia and treatment plan would need to be changed but her presentation at this time is most consistent with alcohol withdrawal delirium.  Treatment Plan Summary: -Continue home Lamictal 100 mg daily and increase Risperdal to home dose of 2 mg qhs. -  Recommend checking CK level.  -Psychiatry will continue to follow as needed.   Disposition: Patient does not meet criteria for psychiatric inpatient admission.  Faythe Dingwall, DO 01/22/2018 11:45 AM

## 2018-01-23 ENCOUNTER — Encounter (HOSPITAL_COMMUNITY): Payer: Self-pay | Admitting: Radiology

## 2018-01-23 ENCOUNTER — Inpatient Hospital Stay (HOSPITAL_COMMUNITY): Payer: BLUE CROSS/BLUE SHIELD

## 2018-01-23 DIAGNOSIS — F10231 Alcohol dependence with withdrawal delirium: Secondary | ICD-10-CM

## 2018-01-23 DIAGNOSIS — G9340 Encephalopathy, unspecified: Secondary | ICD-10-CM

## 2018-01-23 DIAGNOSIS — F10931 Alcohol use, unspecified with withdrawal delirium: Secondary | ICD-10-CM

## 2018-01-23 LAB — CBC
HCT: 25.4 % — ABNORMAL LOW (ref 36.0–46.0)
Hemoglobin: 8.1 g/dL — ABNORMAL LOW (ref 12.0–15.0)
MCH: 40.1 pg — ABNORMAL HIGH (ref 26.0–34.0)
MCHC: 31.9 g/dL (ref 30.0–36.0)
MCV: 125.7 fL — AB (ref 78.0–100.0)
PLATELETS: 251 10*3/uL (ref 150–400)
RBC: 2.02 MIL/uL — ABNORMAL LOW (ref 3.87–5.11)
RDW: 21.2 % — ABNORMAL HIGH (ref 11.5–15.5)
WBC: 6.5 10*3/uL (ref 4.0–10.5)

## 2018-01-23 LAB — CSF CULTURE W GRAM STAIN: Culture: NO GROWTH

## 2018-01-23 LAB — BASIC METABOLIC PANEL
ANION GAP: 7 (ref 5–15)
BUN: <5 mg/dL — ABNORMAL LOW (ref 6–20)
CO2: 20 mmol/L — AB (ref 22–32)
CREATININE: 0.63 mg/dL (ref 0.44–1.00)
Calcium: 8.3 mg/dL — ABNORMAL LOW (ref 8.9–10.3)
Chloride: 113 mmol/L — ABNORMAL HIGH (ref 101–111)
GFR calc Af Amer: >60 mL/min (ref >60)
GLUCOSE: 118 mg/dL — AB (ref 65–99)
Potassium: 4.2 mmol/L (ref 3.5–5.1)
Sodium: 140 mmol/L (ref 135–145)

## 2018-01-23 LAB — CSF CULTURE

## 2018-01-23 MED ORDER — IMMUNE GLOBULIN (HUMAN) 5 GM/50ML IV SOLN
400.0000 mg/kg | INTRAVENOUS | Status: AC
  Administered 2018-01-23 – 2018-01-25 (×3): 45 g via INTRAVENOUS
  Filled 2018-01-23 (×3): qty 50

## 2018-01-23 MED ORDER — IMMUNE GLOBULIN (HUMAN) 5 GM/50ML IV SOLN
400.0000 mg/kg | INTRAVENOUS | Status: DC
  Filled 2018-01-23: qty 50

## 2018-01-23 NOTE — Progress Notes (Addendum)
PULMONARY / CRITICAL CARE MEDICINE   Name: Camie PatienceHolly Space MRN: [id number] DOB: 26-Sep-1971    ADMISSION DATE:  01/20/2018 CONSULTATION DATE:  01/22/2018  REFERRING MD:  [facility]  CHIEF COMPLAINT: Waxing and waning mental status with hallucinations  HISTORY OF PRESENT ILLNESS:        Patient is a 47 year old with bipolar disorder who was admitted complaining of lower extremity weakness and dysesthesias.  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  She is hyperreflexic and has been started on IgG for presumed Guillain-Barr syndrome.  Remarkably her spinal fluid is a protein of only 30.  It was a traumatic tap but there were only 2 white cells on the second sample accounted. Tr to ICU for hallucinations ? ETOh withdrawal vs psychotic episode , for precedex   SUBJECTIVE:  Remains confused per RN but no agitation Sedate on precedex gtt afebrile  VITAL SIGNS: BP 114/79   Pulse 82   Temp 98.3 F (36.8 C) (Axillary)   Resp (!) 23   Ht 5\' 7"  (1.702 m)   Wt 244 lb 7.8 oz (110.9 kg)   SpO2 100%   BMI 38.29 kg/m   HEMODYNAMICS:    VENTILATOR SETTINGS:    INTAKE / OUTPUT: I/O last 3 completed shifts: In: 2951.7 [P.O.:360; I.V.:2591.7] Out: 1150 [Urine:1150]  PHYSICAL EXAMINATION: General: obese, sedate on precedex gtt Neuro:   Non focal  Cardiovascular: S1 and S2 are regular without murmur rub or gallop Lungs:decreased BS BL, no acc muscle use Abdomen: The abdomen is slightly obese and soft without any organomegaly masses tenderness guarding or rebound  LABS:  BMET Recent Labs  Lab 01/21/18 0349 01/22/18 0502 01/23/18 0351  NA 138 138 140  K 3.7 3.6 4.2  CL 107 110 113*  CO2 21* 19* 20*  BUN 6 5* <5*  CREATININE 0.67 0.65 0.63  GLUCOSE 97 97 118*    Electrolytes Recent Labs  Lab 01/20/18 0218 01/20/18 1153 01/21/18 0349 01/22/18 0502  01/23/18 0351  CALCIUM 8.1* 8.0* 7.7* 7.8* 8.3*  MG 1.9  --   --  1.8  --   PHOS  --  2.0* 2.2* 2.4*  --     CBC Recent Labs  Lab 01/21/18 0349 01/22/18 0502 01/23/18 0351  WBC 7.2 7.2 6.5  HGB 8.2* 8.0* 8.1*  HCT 24.7* 24.3* 25.4*  PLT 279 251 251    Coag's No results for input(s): APTT, INR in the last 168 hours.  Sepsis Markers No results for input(s): LATICACIDVEN, PROCALCITON, O2SATVEN in the last 168 hours.  ABG No results for input(s): PHART, PCO2ART, PO2ART in the last 168 hours.  Liver Enzymes Recent Labs  Lab 01/21/18 0349  AST 125*  ALT 44  ALKPHOS 190*  BILITOT 3.0*  ALBUMIN 2.4*    Cardiac Enzymes No results for input(s): TROPONINI, PROBNP in the last 168 hours.  Glucose Recent Labs  Lab 01/22/18 1534  GLUCAP 114*    Imaging No results found.    DISCUSSION:      47 year old with long-standing history of bipolar disorder which is been stable from medications who presented with lower extremity weakness and dysesthesias.  She was also hyporeflexic.  She is being treated with IgG for potential Guillain-Barr syndrome.  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   Bipolar disease ETOH withdrawal, most likely   -Use ativan & taper off precedx gtt -ct Lamictal and Risperdal  -replete B6/B12 -MRI planned by neuro -EEG results pending   PCCM to follow with [facility] while on precedx, once off -can be transferred to floor  Rakesh V. Vassie Loll MD  01/23/2018, 12:16 PM

## 2018-01-23 NOTE — Progress Notes (Addendum)
Neurology Progress Note   S:// Patient seen and examined. She was started on Precedex yesterday for concerns of alcohol withdrawal. Currently on CIWA protocol   O:// Current vital signs: BP (!) 83/72   Pulse 89   Temp 98.3 F (36.8 C) (Axillary)   Resp 18   Ht 5\' 7"  (1.702 m)   Wt 110.9 kg (244 lb 7.8 oz)   SpO2 98%   BMI 38.29 kg/m  Vital signs in last 24 hours: Temp:  [98.2 F (36.8 C)-99 F (37.2 C)] 98.3 F (36.8 C) (01/26 0400) Pulse Rate:  [73-144] 89 (01/26 0400) Resp:  [18-26] 18 (01/26 0400) BP: (83-126)/(72-96) 83/72 (01/26 0400) SpO2:  [95 %-100 %] 98 % (01/26 0400) Weight:  [110.9 kg (244 lb 7.8 oz)] 110.9 kg (244 lb 7.8 oz) (01/26 0439) General: Well-developed well-nourished no acute distress HEENT: Normal cephalic atraumatic Cardia vascular: S1-S2 heard, regular rate rhythm.  Not tachycardic today Lungs clear to auscultation Abdomen nondistended nontender obese Extremities warm and well-perfused Neurological exam   patient was sleeping in bed comfortably when I got to her room She woke to voice. She is alert and oriented to place person and time. Endorsed no complaints and was able to talk fluently, without dysarthria or aphasia. Cranial nerves: Pupils equal round reactive light extraocular movements intact, visual fields full, face symmetric, facial sensation intact, palate elevates symmetrically midline, shoulder shrug intact, tongue midline. Motor exam: Upper extremities 4+/5 with coarse tremor.  Lower extremities also 4+/5. Sensory exam: Intact to light touch all over Coordination: Continued demonstration of the coarse tremor throughout-action and intention tremor.  No tremor at rest. Deep tendon reflexes, mute all over Gait testing was deferred at this time   Medications  Current Facility-Administered Medications:  .  acetaminophen (TYLENOL) tablet 650 mg, 650 mg, Oral, Q6H PRN **OR** acetaminophen (TYLENOL) suppository 650 mg, 650 mg, Rectal,  Q6H PRN, Russella DarEllis, Allison L, NP .  aspirin EC tablet 325 mg, 325 mg, Oral, Daily, Russella DarEllis, Allison L, NP, 325 mg at 01/22/18 0920 .  chlordiazePOXIDE (LIBRIUM) capsule 25 mg, 25 mg, Oral, Once, Edsel PetrinMikhail, Maryann, DO .  dexmedetomidine (PRECEDEX) 200 MCG/50ML (4 mcg/mL) infusion, 0.4-1.2 mcg/kg/hr, Intravenous, Titrated, Mikhail, Tum[place]ac[place]-CarmenMaryann, DO, Last Rate: 8.1 mL/hr at 01/23/18 0437, 0.3 mcg/kg/hr at 01/23/18 0437 .  dextrose 5 % and 0.9 % NaCl with KCl 20 mEq/L infusion, , Intravenous, Continuous, Russella DarEllis, Allison L, NP, Last Rate: 100 mL/hr at 01/23/18 0732 .  enoxaparin (LOVENOX) injection 40 mg, 40 mg, Subcutaneous, Q24H, Russella DarEllis, Allison L, NP, 40 mg at 01/22/18 2030 .  folic acid (FOLVITE) tablet 1 mg, 1 mg, Oral, Daily, Junious SilkEllis, Allison L, NP, 1 mg at 01/22/18 0920 .  lamoTRIgine (LAMICTAL) tablet 100 mg, 100 mg, Oral, Daily, Junious SilkEllis, Allison L, NP, 100 mg at 01/22/18 0920 .  LORazepam (ATIVAN) injection 2-3 mg, 2-3 mg, Intravenous, Q1H PRN, Edsel PetrinMikhail, Maryann, DO, 2 mg at 01/22/18 1522 .  multivitamin with minerals tablet 1 tablet, 1 tablet, Oral, Daily, Edsel PetrinMikhail, Maryann, DO, 1 tablet at 01/22/18 0920 .  ondansetron (ZOFRAN) tablet 4 mg, 4 mg, Oral, Q6H PRN **OR** ondansetron (ZOFRAN) injection 4 mg, 4 mg, Intravenous, Q6H PRN, Russella DarEllis, Allison L, NP .  pyridOXINE (B-6) injection 100 mg, 100 mg, Intravenous, Daily, Mikhail, [place]Maryann, DO, 100 mg at 01/22/18 1230 .  risperiDONE (RISPERDAL) tablet 2 mg, 2 mg, Oral, QHS, Mikhail, Ack[place]Maryann, DO .  thiamine (VITAMIN B-1) tablet 100 mg, 100 mg, Oral, Daily, 100 mg at 01/22/18 0920 **OR** [DISCONTINUED] thiamine (  B-1) injection 100 mg, 100 mg, Intravenous, Daily, Russella Dar, NP Labs CBC    Component Value Date/Time   WBC 6.5 01/23/2018 0351   RBC 2.02 (L) 01/23/2018 0351   HGB 8.1 (L) 01/23/2018 0351   HCT 25.4 (L) 01/23/2018 0351   PLT 251 01/23/2018 0351   MCV 125.7 (H) 01/23/2018 0351   MCH 40.1 (H) 01/23/2018 0351   MCHC 31.9 01/23/2018 0351   RDW 21.2  (H) 01/23/2018 0351   LYMPHSABS 1.5 01/19/2018 1924   MONOABS 0.5 01/19/2018 1924   EOSABS 0.1 01/19/2018 1924   BASOSABS 0.0 01/19/2018 1924    CMP     Component Value Date/Time   NA 140 01/23/2018 0351   K 4.2 01/23/2018 0351   CL 113 (H) 01/23/2018 0351   CO2 20 (L) 01/23/2018 0351   GLUCOSE 118 (H) 01/23/2018 0351   BUN <5 (L) 01/23/2018 0351   CREATININE 0.63 01/23/2018 0351   CALCIUM 8.3 (L) 01/23/2018 0351   PROT 6.6 01/21/2018 0349   ALBUMIN 2.4 (L) 01/21/2018 0349   AST 125 (H) 01/21/2018 0349   ALT 44 01/21/2018 0349   ALKPHOS 190 (H) 01/21/2018 0349   BILITOT 3.0 (H) 01/21/2018 0349   GFRNONAA >60 01/23/2018 0351   GFRAA >60 01/23/2018 0351    Imaging I have reviewed images in epic and the results pertinent to this consultation are: MRIs of the cervical, thoracic and lumbar spine were reviewed MRI of the lumbar spine showed no lower thoracic spinal cord or cauda equina signal abnormalities or intradural inflammation. Advanced chronic lumbar disc degeneration at L5-S1.  Small lumbar disc herniation at L1-L2 without convincing impingement MRI of the cervical spine showed mild spondylosis at C4-5 and 6 7 without significant spinal stenosis.  Mild to moderate bilateral foraminal narrowing at multiple levels. MRI thoracic spine minimal disc bulging at T7-8 without stenosis.  No other abnormalities. CT head done on admission showed no acute changes.  Assessment:  Debbie Golden is a 47 year old woman who has been admitted for evaluation of lower extremity weakness that had been progressively worse for the past 3 days. She is hyperreflexic.  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  Spinal tap done which is not convincing for Guillain-Barr but clinical exam and history was very convincing at the time. She was started on IVIG.  Has received 3 days of IVIG. On the day of admission, she started exhibiting symptoms which are consistent with possible alcohol withdrawal. 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 Although definitive diagnosis towards the etiology of the lower extremity weakness and sensory findings can only be obtained by EMG nerve conduction study, which cannot be done as an inpatient, I would favor finishing the complete course of IVIG.  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 Psychiatry service on board and recommends continuing home Lamictal and increasing Risperdal to the home dose of 2 mg at bedtime. CK levels were also checked which were normal. I would also favor obtaining a brain MRI to rule out any structural lesion in the brain which might be contributing to her current symptoms although I feel that alcohol withdrawal is probably the culprit here.  --  Amie Portland, MD [facility] Pager: [phone] If 7pm to 7am, please call on call as listed on AMION.

## 2018-01-23 NOTE — Progress Notes (Signed)
EEG completed; results pending.    

## 2018-01-23 NOTE — Procedures (Signed)
Date of recording 01/23/2017  Referring physician Dr. Curtis SitesAshish Aurora  Reason for the study Altered mental status   Technical Digital EEG recording using 10-20 electrode international system  Description of the recording Posterior dominant rhythm is 7-8 Hz reactive bilaterally EEG comprising of generalized fast beta activity which is a medication effect such as benzodiazepine. Sleep architecture was not seen. Epileptiform activity was not seen.  Impression This EEG is abnormal and findings are suggestive of   1-very mild nonspecific cerebral dysfunction   2-medication effect of benzodiazepine  3-epileptiform features were not seen during this recording

## 2018-01-23 NOTE — Progress Notes (Signed)
NiF= >-40cm H2O  VC= 1.7/1.5  Pt showed good effort but seemed winded after exercise.  Rt will monitor.

## 2018-01-23 NOTE — Progress Notes (Signed)
Pt CIWA score 30, pt unable to swallow pills, MD aware, Pt has been receiving Ativan, Nursing will cont to follow

## 2018-01-23 NOTE — Progress Notes (Signed)
NIF and VC done with good effort despite waking patient from deep sleep. NIF -20, VC 1.57L

## 2018-01-23 NOTE — Plan of Care (Signed)
Patient slept well through night.  Woke up at 0400 and was oriented to person but no place time or situation.  She was calm and followed commands.  Fell back to sleep shortly after.

## 2018-01-23 NOTE — Progress Notes (Signed)
Patient currently in ICU for precedex drip for possible alcohol withdrawal.   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 Neurology following and appreciated. Started having hallucinations. Psychiatry consulted. Patient was placed on CIWA with ativan, however, continued to worsen. PCCM consulted and patient transferred to ICU.  Will resume care of patient once out of ICU.  Otie Headlee D.O. [facility] Pager [phone]  If 7PM-7AM, please contact night-coverage [email or web address] Password [email or web address] 01/23/2018, 1:57 PM

## 2018-01-23 NOTE — Progress Notes (Signed)
NIF and VC performed with good effort. NIF -23 VC 1.15L

## 2018-01-24 DIAGNOSIS — F3164 Bipolar disorder, current episode mixed, severe, with psychotic features: Secondary | ICD-10-CM

## 2018-01-24 DIAGNOSIS — R4182 Altered mental status, unspecified: Secondary | ICD-10-CM

## 2018-01-24 NOTE — Progress Notes (Addendum)
Neurology Progress Note   S:// Seen and examined.  No acute overnight events. Complains of continuing pain in both her legs.   O:// Current vital signs: BP (!) 135/93   Pulse (!) 134   Temp 99.4 F (37.4 C) (Oral)   Resp (!) 26   Ht 5\' 7"  (1.702 m)   Wt 109.6 kg (241 lb 10 oz)   SpO2 95%   BMI 37.84 kg/m  Vital signs in last 24 hours: Temp:  [98.9 F (37.2 C)-99.4 F (37.4 C)] 99.4 F (37.4 C) (01/27 0300) Pulse Rate:  [78-134] 134 (01/27 0423) Resp:  [17-27] 26 (01/27 0600) BP: (114-135)/(79-109) 135/93 (01/27 0600) SpO2:  [95 %-100 %] 95 % (01/26 1700) Weight:  [109.6 kg (241 lb 10 oz)] 109.6 kg (241 lb 10 oz) (01/27 0423) General: Well-developed well-nourished no acute distress HEENT: Normal cephalic atraumatic Cardia vascular: S1-S2 heard, regular rate rhythm.  Not tachycardic today Lungs clear to auscultation Abdomen nondistended nontender obese Extremities warm and well-perfused Neurological exam  Awake alert oriented x2. Speech is clear.  Naming comprehension repetition intact. able to talk fluently, without dysarthria or aphasia. Cranial nerves: Pupils equal round reactive light extraocular movements intact, visual fields full, face symmetric, facial sensation intact, palate elevates symmetrically midline, shoulder shrug intact, tongue midline. Motor exam: Upper extremities 4+/5 with coarse tremor.  Lower extremities also 4+/5. Sensory exam: Intact to light touch all over Coordination: Continued demonstration of the coarse tremor throughout-action and intention tremor.  No tremor at rest. Deep tendon reflexes, mute all over Gait testing was deferred at this time  Medications  Current Facility-Administered Medications:  .  acetaminophen (TYLENOL) tablet 650 mg, 650 mg, Oral, Q6H PRN **OR** acetaminophen (TYLENOL) suppository 650 mg, 650 mg, Rectal, Q6H PRN, Russella Dar, NP .  aspirin EC tablet 325 mg, 325 mg, Oral, Daily, Russella Dar, NP, 325 mg at  01/23/18 0908 .  chlordiazePOXIDE (LIBRIUM) capsule 25 mg, 25 mg, Oral, Once, Edsel Petrin, DO .  enoxaparin (LOVENOX) injection 40 mg, 40 mg, Subcutaneous, Q24H, Russella Dar, NP, 40 mg at 01/23/18 2030 .  folic acid (FOLVITE) tablet 1 mg, 1 mg, Oral, Daily, Junious Silk L, NP, 1 mg at 01/23/18 0908 .  Immune Globulin 10% (PRIVIGEN) IV infusion 45 g, 400 mg/kg, Intravenous, Q24H, Mikhail, Dorris, DO, Stopped at 01/23/18 2000 .  lamoTRIgine (LAMICTAL) tablet 100 mg, 100 mg, Oral, Daily, Junious Silk L, NP, 100 mg at 01/23/18 0908 .  LORazepam (ATIVAN) injection 2-3 mg, 2-3 mg, Intravenous, Q1H PRN, Edsel Petrin, DO, 2 mg at 01/24/18 0747 .  multivitamin with minerals tablet 1 tablet, 1 tablet, Oral, Daily, Edsel Petrin, DO, 1 tablet at 01/23/18 0908 .  ondansetron (ZOFRAN) tablet 4 mg, 4 mg, Oral, Q6H PRN **OR** ondansetron (ZOFRAN) injection 4 mg, 4 mg, Intravenous, Q6H PRN, Russella Dar, NP .  pyridOXINE (B-6) injection 100 mg, 100 mg, Intravenous, Daily, Edsel Petrin, DO, 100 mg at 01/23/18 0909 .  risperiDONE (RISPERDAL) tablet 2 mg, 2 mg, Oral, QHS, Mikhail, [place], DO, 2 mg at 01/23/18 2029 .  thiamine (VITAMIN B-1) tablet 100 mg, 100 mg, Oral, Daily, 100 mg at 01/23/18 0908 **OR** [DISCONTINUED] thiamine (B-1) injection 100 mg, 100 mg, Intravenous, Daily, Russella Dar, NP Labs CBC    Component Value Date/Time   WBC 6.5 01/23/2018 0351   RBC 2.02 (L) 01/23/2018 0351   HGB 8.1 (L) 01/23/2018 0351   HCT 25.4 (L) 01/23/2018 0351   PLT 251 01/23/2018  0351   MCV 125.7 (H) 01/23/2018 0351   MCH 40.1 (H) 01/23/2018 0351   MCHC 31.9 01/23/2018 0351   RDW 21.2 (H) 01/23/2018 0351   LYMPHSABS 1.5 01/19/2018 1924   MONOABS 0.5 01/19/2018 1924   EOSABS 0.1 01/19/2018 1924   BASOSABS 0.0 01/19/2018 1924    CMP     Component Value Date/Time   NA 140 01/23/2018 0351   K 4.2 01/23/2018 0351   CL 113 (H) 01/23/2018 0351   CO2 20 (L) 01/23/2018 0351    GLUCOSE 118 (H) 01/23/2018 0351   BUN <5 (L) 01/23/2018 0351   CREATININE 0.63 01/23/2018 0351   CALCIUM 8.3 (L) 01/23/2018 0351   PROT 6.6 01/21/2018 0349   ALBUMIN 2.4 (L) 01/21/2018 0349   AST 125 (H) 01/21/2018 0349   ALT 44 01/21/2018 0349   ALKPHOS 190 (H) 01/21/2018 0349   BILITOT 3.0 (H) 01/21/2018 0349   GFRNONAA >60 01/23/2018 0351   GFRAA >60 01/23/2018 0351     EEG-no seizures, generalized slowing.  Imaging I have reviewed images in epic and the results pertinent to this consultation are:  MRI examination of the brain -normal with no acute changes.  Assessment:  47 year old woman admitted for evaluation of progressive lower extremity weakness.  She is areflexic and was areflexic on exam with concern for Guillain-Barr syndrome.  Spinal tap unremarkable.  Started on IVIG based on clinical suspicion and exam and history. Patient started exhibiting symptoms that were concerning for alcohol withdrawal versus psychotic episodes consistent with her psych history. Seen and evaluated by psychiatry. Currently in ICU for management of alcohol withdrawal.  Required Precedex.  Now transitioning to Ativan. 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  Keep on aspirin while on IVIG.  Also maintain good hydration status. Continue  B6 and B12 replacement. Continue CIWA protocol Continue Lamictal and Risperdal per psychiatry recommendations. MRI unremarkable for any structural lesion and EEG negative for seizures. Maintain seizure precautions because of possible alcohol withdrawal. Further management per primary team as you are. We will be available as needed.  Please call with questions.  -- Milon DikesAshish Herta Hink, MD [facility] Pager: [phone] If 7pm to 7am, please call on call  as listed on AMION.  CRITICAL CARE ATTESTATION This patient is critically ill and at significant risk of neurological worsening, death and care requires constant monitoring of vital signs, hemodynamics,respiratory and cardiac monitoring. I spent 30  minutes of neurocritical care time performing neurological assessment, discussion with family, other specialists and medical decision making of high complexityin the care of  this patient.

## 2018-01-24 NOTE — Progress Notes (Addendum)
PULMONARY / CRITICAL CARE MEDICINE   Name: Debbie Golden MRN: [id number] DOB: 1971-02-23    ADMISSION DATE:  01/20/2018 CONSULTATION DATE:  01/22/2018  REFERRING MD:  [facility]  CHIEF COMPLAINT: Waxing and waning mental status with hallucinations  HISTORY OF PRESENT ILLNESS:        Patient is a 47 year old with bipolar disorder who was admitted complaining of lower extremity weakness and dysesthesias.  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  She is hyperreflexic and has been started on IgG for presumed Guillain-Barr syndrome.  Remarkably her spinal fluid is a protein of only 30.  It was a traumatic tap but there were only 2 white cells on the second sample accounted. Tr to ICU for hallucinations ? ETOh withdrawal vs psychotic episode , for precedex   SUBJECTIVE:  Off precedex gtt More awake & alert Fiance at bedside afebrile  VITAL SIGNS: BP (!) 135/93   Pulse (!) 134   Temp 98.7 F (37.1 C) (Oral)   Resp (!) 26   Ht 5\' 7"  (1.702 m)   Wt 241 lb 10 oz (109.6 kg)   SpO2 95%   BMI 37.84 kg/m   HEMODYNAMICS:    VENTILATOR SETTINGS:    INTAKE / OUTPUT: I/O last 3 completed shifts: In: 3522.2 [P.O.:1200; I.V.:2322.2] Out: 3450 [Urine:3450]  PHYSICAL EXAMINATION: General: sitting in bed, awake, having brakfast Neuro:   Non focal  Cardiovascular: S1 and S2 tachy 130 without murmur rub or gallop Lungs:decreased BS BL, no acc muscle use Abdomen: The abdomen is slightly obese and soft without any organomegaly masses tenderness guarding or rebound  LABS:  BMET Recent Labs  Lab 01/21/18 0349 01/22/18 0502 01/23/18 0351  NA 138 138 140  K 3.7 3.6 4.2  CL 107 110 113*  CO2 21* 19* 20*  BUN 6 5* <5*  CREATININE 0.67 0.65 0.63  GLUCOSE 97 97 118*    Electrolytes Recent Labs  Lab 01/20/18 0218 01/20/18 1153 01/21/18 0349 01/22/18 0502  01/23/18 0351  CALCIUM 8.1* 8.0* 7.7* 7.8* 8.3*  MG 1.9  --   --  1.8  --   PHOS  --  2.0* 2.2* 2.4*  --     CBC Recent Labs  Lab 01/21/18 0349 01/22/18 0502 01/23/18 0351  WBC 7.2 7.2 6.5  HGB 8.2* 8.0* 8.1*  HCT 24.7* 24.3* 25.4*  PLT 279 251 251    Coag's No results for input(s): APTT, INR in the last 168 hours.  Sepsis Markers No results for input(s): LATICACIDVEN, PROCALCITON, O2SATVEN in the last 168 hours.  ABG No results for input(s): PHART, PCO2ART, PO2ART in the last 168 hours.  Liver Enzymes Recent Labs  Lab 01/21/18 0349  AST 125*  ALT 44  ALKPHOS 190*  BILITOT 3.0*  ALBUMIN 2.4*    Cardiac Enzymes No results for input(s): TROPONINI, PROBNP in the last 168 hours.  Glucose Recent Labs  Lab 01/22/18 1534  GLUCAP 114*    Imaging Mr Brain Wo Contrast  Result Date: 01/23/2018 CLINICAL DATA:  Lower extremity weakness.  No loosening a shins. EXAM: MRI HEAD WITHOUT CONTRAST TECHNIQUE: Multiplanar, multiecho pulse sequences of the brain and surrounding structures were obtained without intravenous contrast. COMPARISON:  Head CT 01/19/2018 FINDINGS: Brain: Brain has normal appearance without evidence of malformation, atrophy, old or acute small or large vessel infarction, mass lesion, hemorrhage, hydrocephalus or extra-axial collection. Vascular:  Major vessels at the base of the brain show flow. Venous sinuses appear patent. Skull and upper cervical spine: Normal. Sinuses/Orbits: Mild seasonal mucosal thickening.  Orbits negative. Other: None significant. IMPRESSION: Normal MRI of the brain. Electronically Signed   By: Paulina FusiMark  Shogry M.D.   On: 01/23/2018 15:05      DISCUSSION:      47 year old with long-standing history of bipolar disorder which is been stable from medications who presented with lower extremity weakness and dysesthesias.  She was also hyporeflexic.  She is being treated with IgG for potential Guillain-Barr syndrome.  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   Bipolar disease ETOH withdrawal questionable - after obtaining more history from fiance -MRI /-EEG  nml  -Use low dose benzos , off precedx gtt -ct Lamictal and Risperdal  (seems like she was not getting seroquel) -repleting B6/B12 - 5 ds of IVIgG planned by neuro   Sinus tach - ETOH vs BDZ withdrawal , T4 slight high - would follow & use BB only if persistent > 130  PCCM available,can be transferred to tele  Rakesh V. Vassie LollAlva MD  [phone] 01/24/2018, 10:42 AM

## 2018-01-24 NOTE — Progress Notes (Signed)
NIF= -30, -20, -20 cm H2O (three attempts with good effort)  VC= 1.7, 1.5, 1.7L (three attempts with good effort)

## 2018-01-24 NOTE — Progress Notes (Signed)
PROGRESS NOTE    Debbie Golden  VEL:[id number] DOB: 1971-11-16 DOA: 01/20/2018 PCP: Patient, No Pcp Per   Chief Complaint  Patient presents with  . Knee Pain  . Weakness    Brief Narrative:  HPI on 01/20/2018 by Ms. Erin Hearing, NP Debbie Golden is a 47 y.o. female with medical history significant for bipolar disorder with history of catatonia in 2017, obesity, macrocytic anemia, history of genital herpes, and regular alcohol use.  Patient presented to [facility] on the afternoon of 1/22 reporting pain and numbness in both legs greater in the left leg ongoing for a week.  She reports chronic back problems and had a steroid injection 1 week prior.  She reported increased difficulty with ambulation at home and no recent trauma.  When I questioned the patient she reports she has been having difficulty ambulating and weakness/numbness/tingling in the legs waxing and waning for 1 month.  CT of the head was unremarkable.  Due to concerns of possible Guillain- Barr patient was subsequently transferred to this facility so she can undergo a lumbar puncture.  After arrival to [facility] she was evaluated by neurology who documented on exam patient had absent generalized DTRs, mild bilateral lower extremity motor weakness averaging 4/5 from the hips down with decreased wrist flexion, extension, abduction bilaterally at 4/5 as well.  She was not exhibiting any respiratory symptoms or signs of bowel or bladder incontinence or urinary retention.  MRI of the cervical and thoracic spine did not reveal explanation for patient's current symptoms.  MR lumbar spine pending.  Subsequent review of CSF is not consistent with Guillian-Barr.  She will be admitted to the stepdown unit for further monitoring and treatment.  Interim history Neurology consulted, recommended 5 days for IVIG. Psychiatry consulted to aid with patient's hallucinations. PCCM consulted for possible Precedex use, patient was transferred to ICU on 01/22/2018 and  started on Precedex, which has since been discontinued.  Assessment & Plan   Bilateral lower extremity weakness, distal upper extremity weakness -Neurology consulted and appreciated, suspecting possible Ethelene Hal -MRI cervical and thoracic spine normal -MRI lumbar spine, lower thoracic spinal cord and cauda equina. No evidence of intradural inflammation. Chronic lumbar disc degeneration L5-S1. Small lumbar disc herniation L1-L2. -CSF - LP was a traumatic tap. Reviewed with neurology, no pleocytosis, protein was not elevated -Neurology consulted and appreciated, recommended 5 days IVIG, today 4/5 -PT, OT consulted and appreciated- recommended SNF. 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 Reports of approximate 2 drinks every other day versus daily. Discussed with patient's boyfriend, who states she has a drink occasionally with him. However he also admits that he is not home all the time. -Patient was placed on CIWA protocol however agitation worsened with Ativan administration. -continue thiamine, folic acid, multivitamin -PCCM consulted and appreciated for Precedex administration. Patient was transferred to the ICU. Will not transfer patient to stepdown unit and continue to monitor. -Discussed with PCCM, feels that patient may be having benzo withdrawal versus alcohol at all. Recommended using low-dose benzodiazepines. -Will discuss with  psychiatry again on 01/25/2018 -MRI brain normal -  mental status has improved  Obesity  -History of gastric bypass  Bipolar disorder -Continue Lamictal, Risperdal -will consult psychiatry   Abnormal TSH -TSH 4.753, free T4 1.15 -Would recheck thyroid function test in 4-6 weeks. Will speak with endocrinology  DVT Prophylaxis  Lovenox  Code Status: Full  Family Communication: None at bedside  Disposition Plan: Admitted.  Consultants Neurology  Psychiatry  Procedures  Lumbar puncture  Antibiotics   Anti-infectives (From admission, onward)   None      Subjective:   Debbie Golden seen and examined today.  Denies chest pain, shortness breath, abdominal pain, nausea vomiting, diarrhea constipation. Still complains of lower extremity weakness.  Objective:   Vitals:   01/24/18 0900 01/24/18 1000 01/24/18 1100 01/24/18 1112  BP: 127/88 116/78 102/81 115/85  Pulse:    (!) 120  Resp: (!) 27 (!) 21 (!) 28 (!) 29  Temp: 98.7 F (37.1 C)     TempSrc: Oral     SpO2: 94%   96%  Weight:      Height:        Intake/Output Summary (Last 24 hours) at 01/24/2018 1206 Last data filed at 01/24/2018 1000 Gross per 24 hour  Intake 3677.24 ml  Output 2200 ml  Net 1477.24 ml   Filed Weights   01/22/18 0338 01/23/18 0439 01/24/18 0423  Weight: 108.3 kg (238 lb 12.1 oz) 110.9 kg (244 lb 7.8 oz) 109.6 kg (241 lb 10 oz)   Exam  General: Well developed, well nourished, NAD, appears stated age  HEENT: NCAT, mucous membranes moist.   Cardiovascular: S1 S2 auscultated, no rubs, murmurs or gallops. Tachycardic  Respiratory: Clear to auscultation bilaterally with equal chest rise  Abdomen: Soft, nontender, nondistended, + bowel sounds  Extremities: warm dry without cyanosis clubbing or edema  Neuro: AAOx3, nonfocal  Psych: Normal affect and demeanor  Data Reviewed: I have personally reviewed following labs and imaging studies  CBC: Recent Labs  Lab 01/19/18 1924  01/21/18 0349 01/22/18 0502 01/23/18 0351  WBC 9.5 7.2 7.2 6.5  NEUTROABS 7.4*  --   --   --   HGB 9.5* 8.2* 8.0* 8.1*  HCT 27.6* 24.7* 24.3* 25.4*  MCV 121.9* 122.9* 124.0* 125.7*  PLT 331 279 251 449   Basic Metabolic Panel: Recent Labs  Lab 01/20/18 0218 01/20/18 1153 01/21/18 0349 01/22/18 0502 01/23/18 0351  NA 138 138 138 138 140  K 2.9* 3.5 3.7 3.6 4.2  CL 103 105 107 110 113*  CO2 23 20* 21* 19* 20*  GLUCOSE 101* 132* 97 97 118*  BUN 9 6 6  5* <5*  CREATININE 0.64 0.77 0.67 0.65 0.63  CALCIUM 8.1* 8.0* 7.7* 7.8* 8.3*  MG 1.9  --   --  1.8  --   PHOS  --  2.0* 2.2* 2.4*  --    GFR: Estimated Creatinine Clearance: 110.9 mL/min (by C-G formula based on SCr of 0.63 mg/dL). Liver Function Tests: Recent Labs  Lab 01/21/18 0349  AST 125*  ALT 44  ALKPHOS 190*  BILITOT 3.0*  PROT 6.6  ALBUMIN 2.4*   No results for input(s): LIPASE, AMYLASE in the last 168 hours. No results for input(s): AMMONIA in the last 168 hours. Coagulation Profile: No results for input(s): INR, PROTIME in the last 168 hours. Cardiac Enzymes: Recent Labs  Lab 01/22/18 1718  CKTOTAL 62   BNP (last 3 results) No results for input(s): PROBNP in the last 8760 hours. HbA1C: No results for input(s): HGBA1C in the  last 72 hours. CBG: Recent Labs  Lab 01/22/18 1534  GLUCAP 114*   Lipid Profile: No results for input(s): CHOL, HDL, LDLCALC, TRIG, CHOLHDL, LDLDIRECT in the last 72 hours. Thyroid Function Tests: No results for input(s): TSH, T4TOTAL, FREET4, T3FREE, THYROIDAB in the last 72 hours. Anemia Panel: No results for input(s): VITAMINB12, FOLATE, FERRITIN, TIBC, IRON, RETICCTPCT in the last 72 hours. Urine analysis:    Component Value Date/Time   COLORURINE AMBER (A) 01/19/2018 2058   APPEARANCEUR HAZY (A) 01/19/2018 2058   LABSPEC 1.013 01/19/2018 2058   PHURINE 6.0 01/19/2018 2058   GLUCOSEU NEGATIVE 01/19/2018 2058   HGBUR NEGATIVE 01/19/2018 2058   BILIRUBINUR  NEGATIVE 01/19/2018 2058   KETONESUR NEGATIVE 01/19/2018 2058   PROTEINUR NEGATIVE 01/19/2018 2058   NITRITE NEGATIVE 01/19/2018 2058   LEUKOCYTESUR NEGATIVE 01/19/2018 2058   Sepsis Labs: @LABRCNTIP (procalcitonin:4,lacticidven:4)  ) Recent Results (from the past 240 hour(s))  CSF culture     Status: None   Collection Time: 01/20/18  3:55 AM  Result Value Ref Range Status   Specimen Description CSF  Final   Special Requests NONE  Final   Gram Stain   Final    WBC PRESENT, PREDOMINANTLY MONONUCLEAR NO ORGANISMS SEEN CYTOSPIN SMEAR    Culture NO GROWTH 3 DAYS  Final   Report Status 01/23/2018 FINAL  Final  MRSA PCR Screening     Status: None   Collection Time: 01/20/18 11:33 AM  Result Value Ref Range Status   MRSA by PCR NEGATIVE NEGATIVE Final    Comment:        The GeneXpert MRSA Assay (FDA approved for NASAL specimens only), is one component of a comprehensive MRSA colonization surveillance program. It is not intended to diagnose MRSA infection nor to guide or monitor treatment for MRSA infections.       Radiology Studies: Mr Brain Wo Contrast  Result Date: 01/23/2018 CLINICAL DATA:  Lower extremity weakness.  No loosening a shins. EXAM: MRI HEAD WITHOUT CONTRAST TECHNIQUE: Multiplanar, multiecho pulse sequences of the brain and surrounding structures were obtained without intravenous contrast. COMPARISON:  Head CT 01/19/2018 FINDINGS: Brain: Brain has normal appearance without evidence of malformation, atrophy, old or acute small or large vessel infarction, mass lesion, hemorrhage, hydrocephalus or extra-axial collection. Vascular: Major vessels at the base of the brain show flow. Venous sinuses appear patent. Skull and upper cervical spine: Normal. Sinuses/Orbits: Mild seasonal mucosal thickening.  Orbits negative. Other: None significant. IMPRESSION: Normal MRI of the brain. Electronically Signed   By: Nelson Chimes M.D.   On: 01/23/2018 15:05     Scheduled  Meds: . aspirin EC  325 mg Oral Daily  . enoxaparin (LOVENOX) injection  40 mg Subcutaneous Q24H  . folic acid  1 mg Oral Daily  . Immune Globulin 10%  400 mg/kg Intravenous Q24H  . lamoTRIgine  100 mg Oral Daily  . multivitamin with minerals  1 tablet Oral Daily  . pyridOXINE  100 mg Intravenous Daily  . risperiDONE  2 mg Oral QHS  . thiamine  100 mg Oral Daily   Continuous Infusions:    LOS: 4 days   Time Spent in minutes   45 minutes  Aleshia Cartelli D.O. on 01/24/2018 at 12:06 PM  Between 7am to 7pm - Pager - [phone]  After 7pm go to [email or web address] - password [email or web address]  And look for the night coverage person covering for me after hours  [facility] Office  [phone]

## 2018-01-24 NOTE — Clinical Social Work Note (Signed)
Clinical Social Work Assessment  Patient Details  Name: Debbie Golden MRN: [id number] Date of Birth: 08-03-1971  Date of referral:  01/24/18               Reason for consult:  Facility Placement                Permission sought to share information with:  Family Supports Permission granted to share information::     Name::     Insurance underwriterTony  Agency::     Relationship::  Significant Academic librarianother/caretaker  Contact Information:     Housing/Transportation Living arrangements for the past 2 months:  Single Family Home Source of Information:  Other (Comment Required)(Significant other) Patient Interpreter Needed:    Criminal Activity/Legal Involvement Pertinent to Current Situation/Hospitalization:  No - Comment as needed Significant Relationships:  None Lives with:  Significant Other Do you feel safe going back to the place where you live?    Need for family participation in patient care:     Care giving concerns:  Pt lives with her significant other. Per pt's significant other he provides care when needed. Pt is only alert to self.   Social Worker assessment / plan:  CSW spoke with pt's significant other, Alinda Moneyony. Alinda Moneyony states he is agreeable to SNF for pt at d/c and assumed she would need it. Alinda Moneyony suggest that CSW f/o pt to [place] facilities because that is the area in which they live. CSW to follow up with Alinda Moneyony regarding b/o.   Employment status:  [employer] Insurance information:  Managed Medicare PT Recommendations:  Skilled Nursing Facility Information / Referral to community resources:  Skilled Nursing Facility  Patient/Family's Response to care:  Pt's significant other verbalized understanding of CSW role and expressed appreciation for support. Pt's significant other denies any concern regarding pt care at this time.   Patient/Family's Understanding of and Emotional Response to Diagnosis, Current Treatment, and Prognosis:  Pt's significant other understanding and realistic regarding pt's  physical limitations. Pt's significant other understands the need for SNF placement at d/c for pt. Pt's significant other agreeable to SNF placement for pt at d/c, at this time. Pt's significant other denies any concern regarding pt's treatment plan at this time. CSW will continue to provide support and facilitate d/c needs.   Emotional Assessment Appearance:  Appears stated age Attitude/Demeanor/Rapport:  Unable to Assess Affect (typically observed):  Unable to Assess Orientation:  Oriented to Self Alcohol / Substance use:  Alcohol Use(History of alcohol use/abuse.) Psych involvement (Current and /or in the community):  Yes (Comment)(Psych cleared pt.)  Discharge Needs  Concerns to be addressed:  Basic Needs, Care Coordination Readmission within the last 30 days:  No Current discharge risk:  Dependent with Mobility, Substance Abuse Barriers to Discharge:  English as a second language teachernsurance Authorization, Continued Medical Work up   Pacific MutualBridget A Stacyann Mcconaughy, LCSW 01/24/2018, 5:20 PM

## 2018-01-24 NOTE — Progress Notes (Signed)
NIF -30 cmH20 VC- 1.7 L/min Good effort given.

## 2018-01-24 NOTE — Plan of Care (Signed)
Patient has been calm most of night and able to follow commands.  She is disoriented all but self, and is having hallucinations about people here.  She has also been more restless and asking to go home as the morning progressed.  Gave a second ativan dose at 0430.

## 2018-01-24 NOTE — Progress Notes (Signed)
Pt called me into the room asking to be moved out of the bed so the "people on the wall can do what they are here for, I'm in their way". Pt oriented to person, place, and time. Tremors present. Tachycardic. 2mg  Ativan given IV as ordered. Bed alarm on. Call bell in reach.

## 2018-01-25 LAB — BASIC METABOLIC PANEL
Anion gap: 9 (ref 5–15)
BUN: <5 mg/dL — ABNORMAL LOW (ref 6–20)
CHLORIDE: 104 mmol/L (ref 101–111)
CO2: 20 mmol/L — ABNORMAL LOW (ref 22–32)
CREATININE: 0.68 mg/dL (ref 0.44–1.00)
Calcium: 8.3 mg/dL — ABNORMAL LOW (ref 8.9–10.3)
GFR calc non Af Amer: >60 mL/min (ref >60)
Glucose, Bld: 88 mg/dL (ref 65–99)
POTASSIUM: 3.7 mmol/L (ref 3.5–5.1)
SODIUM: 133 mmol/L — AB (ref 135–145)

## 2018-01-25 LAB — CBC
HCT: 25.5 % — ABNORMAL LOW (ref 36.0–46.0)
HEMOGLOBIN: 8.3 g/dL — AB (ref 12.0–15.0)
MCH: 40.5 pg — ABNORMAL HIGH (ref 26.0–34.0)
MCHC: 32.5 g/dL (ref 30.0–36.0)
MCV: 124.4 fL — ABNORMAL HIGH (ref 78.0–100.0)
PLATELETS: 287 10*3/uL (ref 150–400)
RBC: 2.05 MIL/uL — ABNORMAL LOW (ref 3.87–5.11)
RDW: 19.5 % — ABNORMAL HIGH (ref 11.5–15.5)
WBC: 5.1 10*3/uL (ref 4.0–10.5)

## 2018-01-25 LAB — MAGNESIUM: MAGNESIUM: 1.9 mg/dL (ref 1.7–2.4)

## 2018-01-25 LAB — PHOSPHORUS: PHOSPHORUS: 3.5 mg/dL (ref 2.5–4.6)

## 2018-01-25 MED ORDER — CHLORDIAZEPOXIDE HCL 5 MG PO CAPS
5.0000 mg | ORAL_CAPSULE | Freq: Three times a day (TID) | ORAL | Status: DC
  Administered 2018-01-25 – 2018-01-27 (×9): 5 mg via ORAL
  Filled 2018-01-25 (×10): qty 1

## 2018-01-25 MED ORDER — DOCUSATE SODIUM 100 MG PO CAPS
100.0000 mg | ORAL_CAPSULE | Freq: Every day | ORAL | Status: DC | PRN
  Administered 2018-01-25: 100 mg via ORAL
  Filled 2018-01-25: qty 1

## 2018-01-25 NOTE — Care Management Note (Signed)
Case Management Note Donn PieriniKristi Walter Min RN, BSN Unit 4E-Case Manager-- 2H coverage [phone]  Patient Details  Name: Debbie PatienceHolly Golden MRN: [id number] Date of Birth: 1971/02/01  Subjective/Objective:   Pt admitted with AMS and weakness                 Action/Plan: PTA Pt lived at home with boyfriend- CM to follow for transition of care needs- per PTeval - recommendation for SNF- CSW to f/u regarding possible placement needs- psych has also been consulted.   Expected Discharge Date:                  Expected Discharge Plan:  Skilled Nursing Facility  In-House Referral:  Clinical Social Work  Discharge planning Services  CM Consult  Post Acute Care Choice:    Choice offered to:     DME Arranged:    DME Agency:     HH Arranged:    HH Agency:     Status of Service:  In process, will continue to follow  If discussed at Long Length of Stay Meetings, dates discussed:    Discharge Disposition:   Additional Comments:  Darrold SpanWebster, Litzy Dicker Hall, RN 01/25/2018, 10:08 AM

## 2018-01-25 NOTE — Progress Notes (Signed)
NIF -30 VC 2.6 L With good patient effort

## 2018-01-25 NOTE — Progress Notes (Signed)
Occupational Therapy Treatment Patient Details Name: Debbie Golden MRN: [id number] DOB: 10/29/1971 Today's Date: 01/25/2018    History of present illness 47 y.o.femalewith medical history significant forbipolar disorder with history of catatonia in 2017, obesity, macrocytic anemia, history of genital herpes, and regular alcohol use presenting with reports of pain and numbness in both legs for hte past week. MRI of cervical and thoracic spine did not reveal explanation for pts symptoms. MRI lumbar spine pending. Review of CSF not consistent with Guillian-Barr. Pt started on IVIG on 1/23.   OT comments  Pt with extreme anxiety with mobility. Successful assisted pt to stand with Stedy upon second attempt. Pt then became increasingly anxious and began pushing to the R and managed to wedge her feet behind the [place] platform, Maxisky used to safely return pt to bed. Pt may be appropriate for Vital Go tilt bed as she is very unsafe to stand. RN assisted in returning pt to bed. Continue to recommend SNF.  Follow Up Recommendations  SNF;Supervision/Assistance - 24 hour    Equipment Recommendations       Recommendations for Other Services      Precautions / Restrictions Precautions Precautions: Fall Precaution Comments: knees buckle, pt very anxious with mobility Restrictions Weight Bearing Restrictions: No       Mobility Bed Mobility Overal bed mobility: Needs Assistance Bed Mobility: Supine to Sit;Sit to Supine     Supine to sit: Mod assist;+2 for physical assistance Sit to supine: Total assist;+2 for physical assistance(with lift equipment)   General bed mobility comments: Pt able to come to long sitting with HOB elevated with min assistance for trunk, once in sitting required assistance with LE advancement and scooting to edge of bed.    Transfers Overall transfer level: Needs assistance Equipment used: 2 person hand held assist Transfers: Sit to/from Stand Sit to Stand: Mod  assist;+2 physical assistance(x 2) Stand pivot transfers: Max assist;+2 physical assistance;+2 safety/equipment       General transfer comment: Pt performed transfer +2 mod assistance but unable to extend B hips for plate placement. Performed additonal trial with emphasis on hip extension in standing.  Pt unable to achieve complete platement of stedy plates before leaning strongly to R and lunging her L foot back wedging it between the bar and foot plate of the stedy.  Pt unable to calm down to follow commands and HR quickly escalated to 168 bpm.  RN entered room to assist and with +3 patient remains unable to move foot back to plate.  Another nurse on the unit entered for assistance and patient remained immobile and unwilling to move pushing her weight stringly through her L foot.  Pt required application of maxisky lift for safe transport back to bed.  Pt leaning on frame of stedy so patient and stedy frame lifted into bed and sara stedy removed in supine.  Pt apologetic of her behavior post treatment and upset that she could not stand.    Balance Overall balance assessment: Needs assistance Sitting-balance support: Feet supported;Bilateral upper extremity supported Sitting balance-Leahy Scale: Poor Sitting balance - Comments: Strong R lateral lean.       Standing balance-Leahy Scale: Zero Standing balance comment: Pt buckling within stedy with zero righting response.                             ADL either performed or assessed with clinical judgement   ADL  General ADL Comments: Pt with minimal functional use of hands, reports that sensation is intact.     Vision       Perception     Praxis      Cognition Arousal/Alertness: Awake/alert Behavior During Therapy: Anxious(extremely) Overall Cognitive Status: No family/caregiver present to determine baseline cognitive functioning Area of Impairment: Following  commands;Safety/judgement;Awareness;Problem solving;Memory                     Memory: Decreased short-term memory Following Commands: Follows one step commands inconsistently(depending on anxiety) Safety/Judgement: Decreased awareness of safety;Decreased awareness of deficits Awareness: Emergent Problem Solving: Decreased initiation;Difficulty sequencing;Requires verbal cues;Requires tactile cues General Comments: Pt extremely anxious and did not respond to commands when anxiety escalated.  Pt self limiting due to anxiety. pt with visual hallucinations          Exercises     Shoulder Instructions       General Comments      Pertinent Vitals/ Pain       Pain Assessment: Faces Faces Pain Scale: Hurts little more Pain Location: buttocks, knees Pain Descriptors / Indicators: Sore Pain Intervention(s): Monitored during session;Repositioned  Home Living                                          Prior Functioning/Environment              Frequency  Min 2X/week        Progress Toward Goals  OT Goals(current goals can now be found in the care plan section)  Progress towards OT goals: Not progressing toward goals - comment  Acute Rehab OT Goals Patient Stated Goal: to get better OT Goal Formulation: With patient Time For Goal Achievement: 02/04/18 Potential to Achieve Goals: Fair  Plan Discharge plan remains appropriate    Co-evaluation    PT/OT/SLP Co-Evaluation/Treatment: Yes Reason for Co-Treatment: Necessary to address cognition/behavior during functional activity;For patient/therapist safety PT goals addressed during session: Mobility/safety with mobility;Balance OT goals addressed during session: Strengthening/ROM      AM-PAC PT "6 Clicks" Daily Activity     Outcome Measure   Help from another person eating meals?: Total Help from another person taking care of personal grooming?: Total Help from another person toileting,  which includes using toliet, bedpan, or urinal?: Total Help from another person bathing (including washing, rinsing, drying)?: Total Help from another person to put on and taking off regular upper body clothing?: A Lot Help from another person to put on and taking off regular lower body clothing?: Total 6 Click Score: 7    End of Session    OT Visit Diagnosis: Unsteadiness on feet (R26.81);Other abnormalities of gait and mobility (R26.89);Muscle weakness (generalized) (M62.81)   Activity Tolerance Other (comment)(limited by extreme anxiety)   Patient Left in bed;with call bell/phone within reach;with nursing/sitter in room   Nurse Communication Mobility status;Need for lift equipment        Time: 1121-1155 OT Time Calculation (min): 34 min  Charges: OT General Charges $OT Visit: 1 Visit OT Treatments $Therapeutic Activity: 8-22 mins  01/25/2018 Martie RoundJulie Baran Kuhrt, OTR/L Pager: [phone]   Iran PlanasMayberry, [place] BailiffJulie Lynn 01/25/2018, 12:41 PM

## 2018-01-25 NOTE — Progress Notes (Signed)
PROGRESS NOTE    Debbie Golden  ZOX:[id number] DOB: June 27, 1971 DOA: 01/20/2018 PCP: Patient, No Pcp Per   Chief Complaint  Patient presents with  . Knee Pain  . Weakness    Brief Narrative:  HPI on 01/20/2018 by Ms. Erin Hearing, NP Debbie Golden is a 47 y.o. female with medical history significant for bipolar disorder with history of catatonia in 2017, obesity, macrocytic anemia, history of genital herpes, and regular alcohol use.  Patient presented to [facility] on the afternoon of 1/22 reporting pain and numbness in both legs greater in the left leg ongoing for a week.  She reports chronic back problems and had a steroid injection 1 week prior.  She reported increased difficulty with ambulation at home and no recent trauma.  When I questioned the patient she reports she has been having difficulty ambulating and weakness/numbness/tingling in the legs waxing and waning for 1 month.  CT of the head was unremarkable.  Due to concerns of possible Guillain- Barr patient was subsequently transferred to this facility so she can undergo a lumbar puncture.  After arrival to [facility] she was evaluated by neurology who documented on exam patient had absent generalized DTRs, mild bilateral lower extremity motor weakness averaging 4/5 from the hips down with decreased wrist flexion, extension, abduction bilaterally at 4/5 as well.  She was not exhibiting any respiratory symptoms or signs of bowel or bladder incontinence or urinary retention.  MRI of the cervical and thoracic spine did not reveal explanation for patient's current symptoms.  MR lumbar spine pending.  Subsequent review of CSF is not consistent with Guillian-Barr.  She will be admitted to the stepdown unit for further monitoring and treatment.  Interim history Neurology consulted, recommended 5 days for IVIG. Psychiatry consulted to aid with patient's hallucinations. PCCM consulted for possible Precedex use, patient was transferred to ICU on 01/22/2018 and  started on Precedex, which has since been discontinued.  Assessment & Plan   Bilateral lower extremity weakness, distal upper extremity weakness -Neurology consulted and appreciated, suspecting possible Ethelene Hal -MRI cervical and thoracic spine normal -MRI lumbar spine, lower thoracic spinal cord and cauda equina. No evidence of intradural inflammation. Chronic lumbar disc degeneration L5-S1. Small lumbar disc herniation L1-L2. -CSF - LP was a traumatic tap. Reviewed with neurology, no pleocytosis, protein was not elevated -Neurology consulted and appreciated, recommended 5 days IVIG, today 4/5 -PT, OT consulted and appreciated- recommended SNF. 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 Reports of approximate 2 drinks every other day versus daily. Discussed with patient's boyfriend, who states she has a drink occasionally with him. However he also admits that he is not home all the time. -Patient was placed on CIWA protocol however agitation worsened with Ativan administration. -continue thiamine, folic acid, multivitamin -PCCM consulted and appreciated for Precedex administration. Patient was transferred to the ICU. Will not transfer patient to stepdown unit and continue to monitor. -Discussed with PCCM, feels that patient may be having benzo withdrawal versus alcohol at all. Recommended using low-dose benzodiazepines. -Will discuss  with psychiatry again today -MRI brain normal -  mental status has improved -added low dose librium scheduled  Obesity  -History of gastric bypass  Bipolar disorder -Continue Lamictal, Risperdal -psychiatry consulted and appreciated  Abnormal TSH -TSH 4.753, free T4 1.15 -Would recheck thyroid function test in 4-6 weeks. Will speak with endocrinology  Constipation -Added colace  DVT Prophylaxis  Lovenox  Code Status: Full  Family Communication: None at bedside  Disposition Plan: Admitted. Continue to monitor in stepdown. Will discuss again with psychiatry. Dispo TBD  Consultants Neurology  Psychiatry PCCM  Procedures  Lumbar puncture  Antibiotics   Anti-infectives (From admission, onward)   None      Subjective:   Debbie Golden seen and examined today.  Admits to seeing spiders. Continues to have lower extremity weakness. Denies chest pain, shortness breath, abdominal pain, nausea vomiting, diarrhea. Complains of constipation.   Objective:   Vitals:   01/25/18 0400 01/25/18 0500 01/25/18 0723 01/25/18 0800  BP: (!) 136/97   125/87  Pulse:   (!) 134   Resp: (!) 24   18  Temp:    98.4 F (36.9 C)  TempSrc:    Oral  SpO2: 100%     Weight:  105.8 kg (233 lb 4 oz)    Height:        Intake/Output Summary (Last 24 hours) at 01/25/2018 0934 Last data filed at 01/25/2018 0900 Gross per 24 hour  Intake 1080 ml  Output 1650 ml  Net -570 ml   Filed Weights   01/23/18 0439 01/24/18 0423 01/25/18 0500  Weight: 110.9 kg (244 lb 7.8 oz) 109.6 kg (241 lb 10 oz) 105.8 kg (233 lb 4 oz)   Exam  General: Well developed, well nourished, NAD, appears stated age  HEENT: NCAT, mucous membranes moist.   Cardiovascular: S1 S2 auscultated, tachycardic, no murmurs  Respiratory: Clear to auscultation bilaterally with equal chest rise  Abdomen: Soft, nontender, nondistended, + bowel sounds  Extremities: warm dry without cyanosis clubbing or edema  Neuro: AAOx3,  nonfocal, LE weakness. Finger-nose testing show some dysmetria  Psych: Anxious  Data Reviewed: I have personally reviewed following labs and imaging studies  CBC: Recent Labs  Lab 01/19/18 1924 01/21/18 0349 01/22/18 0502 01/23/18 0351 01/25/18 0244  WBC 9.5 7.2 7.2 6.5 5.1  NEUTROABS 7.4*  --   --   --   --   HGB 9.5* 8.2* 8.0* 8.1* 8.3*  HCT 27.6* 24.7* 24.3* 25.4* 25.5*  MCV 121.9* 122.9* 124.0* 125.7* 124.4*  PLT 331 279 251 251 883   Basic Metabolic Panel: Recent Labs  Lab 01/20/18 0218 01/20/18 1153 01/21/18 0349 01/22/18 0502 01/23/18 0351 01/25/18 0244  NA 138 138 138 138 140 133*  K 2.9* 3.5 3.7 3.6 4.2 3.7  CL 103 105 107 110 113* 104  CO2 23 20* 21* 19* 20* 20*  GLUCOSE 101* 132* 97 97 118* 88  BUN 9 6 6  5* <5* <5*  CREATININE 0.64 0.77 0.67 0.65 0.63 0.68  CALCIUM 8.1* 8.0* 7.7* 7.8* 8.3* 8.3*  MG 1.9  --   --  1.8  --  1.9  PHOS  --  2.0* 2.2* 2.4*  --  3.5   GFR: Estimated Creatinine Clearance: 108.8 mL/min (by C-G formula based on SCr of 0.68 mg/dL). Liver Function Tests: Recent Labs  Lab 01/21/18 0349  AST 125*  ALT 44  ALKPHOS 190*  BILITOT 3.0*  PROT 6.6  ALBUMIN 2.4*   No results for input(s): LIPASE, AMYLASE in the last 168 hours. No results for  input(s): AMMONIA in the last 168 hours. Coagulation Profile: No results for input(s): INR, PROTIME in the last 168 hours. Cardiac Enzymes: Recent Labs  Lab 01/22/18 1718  CKTOTAL 62   BNP (last 3 results) No results for input(s): PROBNP in the last 8760 hours. HbA1C: No results for input(s): HGBA1C in the last 72 hours. CBG: Recent Labs  Lab 01/22/18 1534  GLUCAP 114*   Lipid Profile: No results for input(s): CHOL, HDL, LDLCALC, TRIG, CHOLHDL, LDLDIRECT in the last 72 hours. Thyroid Function Tests: No results for input(s): TSH, T4TOTAL, FREET4, T3FREE, THYROIDAB in the last 72 hours. Anemia Panel: No results for input(s): VITAMINB12, FOLATE, FERRITIN, TIBC, IRON, RETICCTPCT in  the last 72 hours. Urine analysis:    Component Value Date/Time   COLORURINE AMBER (A) 01/19/2018 2058   APPEARANCEUR HAZY (A) 01/19/2018 2058   LABSPEC 1.013 01/19/2018 2058   PHURINE 6.0 01/19/2018 2058   GLUCOSEU NEGATIVE 01/19/2018 2058   HGBUR NEGATIVE 01/19/2018 2058   BILIRUBINUR NEGATIVE 01/19/2018 2058   KETONESUR NEGATIVE 01/19/2018 2058   PROTEINUR NEGATIVE 01/19/2018 2058   NITRITE NEGATIVE 01/19/2018 2058   LEUKOCYTESUR NEGATIVE 01/19/2018 2058   Sepsis Labs: @LABRCNTIP (procalcitonin:4,lacticidven:4)  ) Recent Results (from the past 240 hour(s))  CSF culture     Status: None   Collection Time: 01/20/18  3:55 AM  Result Value Ref Range Status   Specimen Description CSF  Final   Special Requests NONE  Final   Gram Stain   Final    WBC PRESENT, PREDOMINANTLY MONONUCLEAR NO ORGANISMS SEEN CYTOSPIN SMEAR    Culture NO GROWTH 3 DAYS  Final   Report Status 01/23/2018 FINAL  Final  MRSA PCR Screening     Status: None   Collection Time: 01/20/18 11:33 AM  Result Value Ref Range Status   MRSA by PCR NEGATIVE NEGATIVE Final    Comment:        The GeneXpert MRSA Assay (FDA approved for NASAL specimens only), is one component of a comprehensive MRSA colonization surveillance program. It is not intended to diagnose MRSA infection nor to guide or monitor treatment for MRSA infections.       Radiology Studies: Mr Brain Wo Contrast  Result Date: 01/23/2018 CLINICAL DATA:  Lower extremity weakness.  No loosening a shins. EXAM: MRI HEAD WITHOUT CONTRAST TECHNIQUE: Multiplanar, multiecho pulse sequences of the brain and surrounding structures were obtained without intravenous contrast. COMPARISON:  Head CT 01/19/2018 FINDINGS: Brain: Brain has normal appearance without evidence of malformation, atrophy, old or acute small or large vessel infarction, mass lesion, hemorrhage, hydrocephalus or extra-axial collection. Vascular: Major vessels at the base of the brain show  flow. Venous sinuses appear patent. Skull and upper cervical spine: Normal. Sinuses/Orbits: Mild seasonal mucosal thickening.  Orbits negative. Other: None significant. IMPRESSION: Normal MRI of the brain. Electronically Signed   By: Nelson Chimes M.D.   On: 01/23/2018 15:05     Scheduled Meds: . aspirin EC  325 mg Oral Daily  . chlordiazePOXIDE  5 mg Oral TID  . enoxaparin (LOVENOX) injection  40 mg Subcutaneous Q24H  . folic acid  1 mg Oral Daily  . Immune Globulin 10%  400 mg/kg Intravenous Q24H  . lamoTRIgine  100 mg Oral Daily  . multivitamin with minerals  1 tablet Oral Daily  . pyridOXINE  100 mg Intravenous Daily  . risperiDONE  2 mg Oral QHS  . thiamine  100 mg Oral Daily   Continuous Infusions:    LOS: 5 days  Time Spent in minutes   45 minutes  Emira Eubanks D.O. on 01/25/2018 at 9:34 AM  Between 7am to 7pm - Pager - [phone]  After 7pm go to [email or web address] - password [email or web address]  And look for the night coverage person covering for me after hours  [facility] Office  [phone]

## 2018-01-25 NOTE — Consult Note (Signed)
[facility] Psych Consult Progress Note  01/25/2018 1:38 PM Debbie Golden  MRN:  [id number] Subjective:   Debbie Golden was transferred to the ICU for treatment of questionable alcohol withdrawal on 1/25 and Precedex was started but has been stopped. Her mental status seems to be improving. Debbie Golden was oriented x 4 yesterday but today Debbie Golden reported VH although Debbie Golden continued to be oriented. CK level was 62. Head MRI was unremarkable. BP has been elevated with tachycardia (120s) and tachypnea. Debbie Golden is currently prescribed Librium 5 mg TID for concern for withdrawal from unknown substance. Debbie Golden does report using Flexeril. PMP indicates Tramadol was filled on 1/17 (#40) and Ativan was last filled in March 2018.  On interview, Debbie Golden reports that Debbie Golden remembers feeling confused over the past few days. Debbie Golden reports VH yesterday but denies hallucinations today. Debbie Golden was oriented to person and place. Debbie Golden thought the date was 01/17/2012. Debbie Golden reports poor sleep for the past two nights. Debbie Golden was using alcohol heavily until a month ago. Debbie Golden reports that her outpatient doctor prescribed Ativan to use as needed. Debbie Golden rarely uses it. Her stepmother was present at bedside but stepped out for the interview. Her stepmother later informed the notewriter that Debbie Golden is concerned about patient minimizing her alcohol use. Debbie Golden was not provided any information about the patient's care since the patient did not provide consent.   Principal Problem: Altered mental status Diagnosis:   Patient Active Problem List   Diagnosis Date Noted  . Acute encephalopathy [G93.40]   . Alcohol withdrawal syndrome, with delirium ([place]) [F10.231]   . Altered mental status [R41.82]   . Leg weakness, bilateral [R29.898] 01/20/2018  . Obesity [E66.9] 01/20/2018  . Bipolar disorder ([place]) [F31.9] 01/20/2018  . Macrocytic anemia [D53.9] 01/20/2018  . Acute hypokalemia [E87.6] 01/20/2018  . Alcohol use [Z78.9] 01/20/2018  . Guillain Barr syndrome ([place]) [G61.0]   . Catatonia  associated with another mental disorder [F06.1] 11/10/2016  . Noncompliance [Z91.19] 11/10/2016  . Involuntary commitment [Z04.6] 11/10/2016  . GERD (gastroesophageal reflux disease) [K21.9] 06/25/2015  . Bipolar disorder, curr episode mixed, severe, with psychotic features ([place]) [F31.64] 06/22/2015  . Bipolar disorder (manic depression) ([place]) [F31.9] 06/21/2015  . Hypokalemia [E87.6] 06/21/2015  . Delirium, acute [R41.0] 06/19/2015  . Major depression [F32.9] 06/19/2015  . Alcohol abuse [F10.10] 06/19/2015   Total Time spent with patient: 15 minutes  Past Psychiatric History: Alcohol abuse and bipolar disorder.   Past Medical History:  Past Medical History:  Diagnosis Date  . Bipolar disorder ([place])   . Depression   . Genital herpes   . Obesity     Past Surgical History:  Procedure Laterality Date  . ANKLE FRACTURE SURGERY Right   . GASTRIC BYPASS N/A 2007   Family History:  Family History  Adopted: Yes   Family Psychiatric  History: Unknown  Social History:  Social History   Substance and Sexual Activity  Alcohol Use Yes     Social History   Substance and Sexual Activity  Drug Use No    Social History   Socioeconomic History  . Marital status: Single    Spouse name: None  . Number of children: None  . Years of education: None  . Highest education level: None  Social Needs  . Financial resource strain: None  . Food insecurity - worry: None  . Food insecurity - inability: None  . Transportation needs - medical: None  . Transportation needs - non-medical: None  Occupational History  . None  Tobacco Use  . Smoking status: Never Smoker  . Smokeless tobacco: Never Used  Substance and Sexual Activity  . Alcohol use: Yes  . Drug use: No  . Sexual activity: None  Other Topics Concern  . None  Social History Narrative  . None    Sleep: Poor  Appetite:  Fair  Current Medications: Current Facility-Administered Medications  Medication Dose Route  Frequency Provider Last Rate Last Dose  . acetaminophen (TYLENOL) tablet 650 mg  650 mg Oral Q6H PRN Samella Parr, NP       Or  . acetaminophen (TYLENOL) suppository 650 mg  650 mg Rectal Q6H PRN Samella Parr, NP      . aspirin EC tablet 325 mg  325 mg Oral Daily Samella Parr, NP   325 mg at 01/25/18 0919  . chlordiazePOXIDE (LIBRIUM) capsule 5 mg  5 mg Oral TID Cristal Ford, DO   5 mg at 01/25/18 6256  . docusate sodium (COLACE) capsule 100 mg  100 mg Oral Daily PRN Cristal Ford, DO   100 mg at 01/25/18 3893  . enoxaparin (LOVENOX) injection 40 mg  40 mg Subcutaneous Q24H Samella Parr, NP   40 mg at 01/24/18 2032  . folic acid (FOLVITE) tablet 1 mg  1 mg Oral Daily Samella Parr, NP   1 mg at 01/25/18 0919  . lamoTRIgine (LAMICTAL) tablet 100 mg  100 mg Oral Daily Samella Parr, NP   100 mg at 01/25/18 0919  . LORazepam (ATIVAN) injection 2-3 mg  2-3 mg Intravenous Q1H PRN Cristal Ford, DO   3 mg at 01/25/18 7342  . multivitamin with minerals tablet 1 tablet  1 tablet Oral Daily Cristal Ford, DO   1 tablet at 01/25/18 0919  . ondansetron (ZOFRAN) tablet 4 mg  4 mg Oral Q6H PRN Samella Parr, NP       Or  . ondansetron [facility]) injection 4 mg  4 mg Intravenous Q6H PRN Samella Parr, NP      . pyridOXINE (B-6) injection 100 mg  100 mg Intravenous Daily Cristal Ford, DO   100 mg at 01/25/18 8768  . risperiDONE (RISPERDAL) tablet 2 mg  2 mg Oral QHS Mikhail, [place]uther[place], DO   2 mg at 01/24/18 2032  . thiamine (VITAMIN B-1) tablet 100 mg  100 mg Oral Daily Samella Parr, NP   100 mg at 01/25/18 1157    Lab Results:  Results for orders placed or performed during the hospital encounter of 01/20/18 (from the past 48 hour(s))  CBC     Status: Abnormal   Collection Time: 01/25/18  2:44 AM  Result Value Ref Range   WBC 5.1 4.0 - 10.5 K/uL   RBC 2.05 (L) 3.87 - 5.11 MIL/uL   Hemoglobin 8.3 (L) 12.0 - 15.0 g/dL   HCT 25.5 (L) 36.0 - 46.0 %   MCV 124.4  (H) 78.0 - 100.0 fL   MCH 40.5 (H) 26.0 - 34.0 pg   MCHC 32.5 30.0 - 36.0 g/dL   RDW 19.5 (H) 11.5 - 15.5 %   Platelets 287 150 - 400 K/uL  Basic metabolic panel     Status: Abnormal   Collection Time: 01/25/18  2:44 AM  Result Value Ref Range   Sodium 133 (L) 135 - 145 mmol/L    Comment: DELTA CHECK NOTED   Potassium 3.7 3.5 - 5.1 mmol/L   Chloride 104 101 - 111 mmol/L   CO2 20 (L)  22 - 32 mmol/L   Glucose, Bld 88 65 - 99 mg/dL   BUN <5 (L) 6 - 20 mg/dL   Creatinine, Ser 0.68 0.44 - 1.00 mg/dL   Calcium 8.3 (L) 8.9 - 10.3 mg/dL   GFR calc non Af Amer >60 >60 mL/min   GFR calc Af Amer >60 >60 mL/min    Comment: (NOTE) The eGFR has been calculated using the CKD EPI equation. This calculation has not been validated in all clinical situations. eGFR's persistently <60 mL/min signify possible Chronic Kidney Disease.    Anion gap 9 5 - 15  Magnesium     Status: None   Collection Time: 01/25/18  2:44 AM  Result Value Ref Range   Magnesium 1.9 1.7 - 2.4 mg/dL  Phosphorus     Status: None   Collection Time: 01/25/18  2:44 AM  Result Value Ref Range   Phosphorus 3.5 2.5 - 4.6 mg/dL    Blood Alcohol level:  Lab Results  Component Value Date   ETH <10 01/20/2018   ETH <5 11/09/2016    Musculoskeletal: Strength & Muscle Tone: decreased in lower bilateral extremities.  Gait & Station: UTA since patient was lying in bed.  Patient leans: N/A  Psychiatric Specialty Exam: Physical Exam  Nursing note and vitals reviewed. Constitutional: Debbie Golden appears well-developed and well-nourished.  HENT:  Head: Normocephalic and atraumatic.  Neck: Normal range of motion.  Respiratory: Effort normal.  Musculoskeletal: Normal range of motion.  Neurological: Debbie Golden is alert.  Oriented to person and place.  Skin: No rash noted.  Psychiatric: Debbie Golden has a normal mood and affect. Her speech is normal and behavior is normal. Judgment and thought content normal. Cognition and memory are impaired.     Review of Systems  Constitutional: Negative for chills and fever.  Gastrointestinal: Negative for abdominal pain, constipation, diarrhea, nausea and vomiting.  Psychiatric/Behavioral: Negative for hallucinations, substance abuse and suicidal ideas. The patient has insomnia.     Blood pressure 136/90, pulse (!) 122, temperature 99.1 F (37.3 C), temperature source Oral, resp. rate (!) 27, height 5' 7"  (1.702 m), weight 105.8 kg (233 lb 4 oz), SpO2 100 %.Body mass index is 36.53 kg/m.  General Appearance: Fairly Groomed, middle aged, African American female, wearing a hospital gown and lying in bed. NAD.  Eye Contact:  Good  Speech:  Clear and Coherent and Normal Rate  Volume:  Normal  Mood:  "Okay"  Affect:  Appropriate and Full Range  Thought Process:  Goal Directed and Linear  Orientation:  Other:  Person and place.  Thought Content:  Logical  Suicidal Thoughts:  No  Homicidal Thoughts:  No  Memory:  Immediate;   Good Recent;   Fair Remote;   Fair  Judgement:  Fair  Insight:  Fair  Psychomotor Activity:  Normal  Concentration:  Concentration: Good and Attention Span: Good  Recall:  [employer] of Knowledge:  Fair  Language:  Fair  Akathisia:  No  Handed:  Right  AIMS (if indicated):   N/A  Assets:  Communication Skills Housing Social Support  ADL's:  Impaired since currently needs assistance with ADLs due to weakness.   Cognition:  Impaired. Patient is oriented to person and place.   Sleep:   Poor   Assessment: Ytzel Gubler is a 47 y.o. female who was admitted with gradual weakness in her lower and upper extremities with concern for Guillain-Barre. 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 Debbie Golden was oriented on admission but her mental status has fluctuated throughout hospitalization. It seems to be improving over the past day. There is still concern for alcohol withdrawal since the patient may be  minimizing her symptoms so Debbie Golden will continue a low dose of Librium for alcohol withdrawal. Her lab work suggests alcohol abuse secondary to macrocytic anemia and AST to ALT ratio seen with alcoholism. Catatonia was also a part of the differential given prior history and in the setting of autonomic instability (tachycardia and tachypnea without fever) but there are no other catatonic symptoms such as rigidity and CK level is normal. Recommend PRN Risperdal for hallucinations.    Treatment Plan Summary: -Continue home Lamictal 100 mg daily and Risperdal 2 mg qhs. -Start Risperdal 0.5 mg BID PRN for hallucinations.  -Patient may benefit from resources for alcohol abuse following discharge (IOP versus inpatient rehab).  -Psychiatry will continue to follow as needed.     Faythe Dingwall, DO 01/25/2018, 1:38 PM

## 2018-01-25 NOTE — Progress Notes (Signed)
NIF: -20 VC: 2.0 L  With good patient effort.

## 2018-01-25 NOTE — Progress Notes (Addendum)
Physical Therapy Treatment Patient Details Name: Debbie Golden MRN: [id number] DOB: 07-01-1971 Today's Date: 01/25/2018    History of Present Illness 47 y.o.femalewith medical history significant forbipolar disorder with history of catatonia in 2017, obesity, macrocytic anemia, history of genital herpes, and regular alcohol use presenting with reports of pain and numbness in both legs for hte past week. MRI of cervical and thoracic spine did not reveal explanation for pts symptoms. MRI lumbar spine pending. Review of CSF not consistent with Guillian-Barr. Pt started on IVIG on 1/23.    PT Comments    Pt initially appropriate with her responses to therapeutic interventions.  Pt remains limited due to anxiety but able to stand twice with flexed hips.  Pt during second trial stood and stedy plates placed behind her bottom.  Pt began to have what appeared like an anxiety attack and she started to lean strongly to the R and wedge her left foot in the equipment.  She ultimately required maxisky lift for back to bed transfer as she was unable to stand and follow commands due to anxiety.  Pt remains to require SNF placement as she is not appropriate to return home in her current functional presentation.    Follow Up Recommendations  SNF;Supervision/Assistance - 24 hour     Equipment Recommendations  None recommended by PT(TBA at next venue)    Recommendations for Other Services       Precautions / Restrictions Precautions Precautions: Fall Precaution Comments: knees buckle, pt very anxious with mobility Restrictions Weight Bearing Restrictions: No    Mobility  Bed Mobility Overal bed mobility: Needs Assistance Bed Mobility: Supine to Sit     Supine to sit: Mod assist;+2 for physical assistance     General bed mobility comments: Pt able to come to long sitting with HOB elevated with min assistance for trunk, once in sitting required assistance with LE advancement and scooting to edge  of bed.    Transfers Overall transfer level: Needs assistance Equipment used: (sara stedy) Transfers: Sit to/from Stand Sit to Stand: Mod assist;+2 physical assistance(Pt performed transfer x2 trials.  )         General transfer comment: Pt performed transfer +2 mod assistance but unable to extend B hips for plate placement. Performed additonal trial with emphasis on hip extension in standing.  Pt unable to achieve complete platement of stedy plates before leaning strongly to R and lunging her L foot back wedging it between the bar and foot plate of the stedy.  Pt unable to calm down to follow commands and HR quickly escalated to 168 bpm.  RN entered room to assist and with +3 patient remains unable to move foot back to plate.  Another nurse on the unit entered for assistance and patient remained immobile and unwilling to move pushing her weight strongly through her L foot.  Pt required application of maxisky lift for safe transport back to bed.  Pt leaning on frame of stedy so patient and stedy frame lifted into bed and sara stedy removed in supine.  Pt apologetic of her behavior post treatment and upset that she could not stand.  Ambulation/Gait Ambulation/Gait assistance: (Unable.  )               Stairs            Wheelchair Mobility    Modified Rankin (Stroke Patients Only)       Balance Overall balance assessment: Needs assistance Sitting-balance support: Feet supported;Bilateral upper extremity supported Sitting  balance-Leahy Scale: Poor Sitting balance - Comments: Strong R lateral lean.       Standing balance-Leahy Scale: Zero Standing balance comment: Pt buckling within stedy with zero righting response.                              Cognition Arousal/Alertness: Awake/alert Behavior During Therapy: Anxious(Extremely) Overall Cognitive Status: No family/caregiver present to determine baseline cognitive functioning Area of Impairment: Following  commands;Safety/judgement;Awareness;Problem solving;Memory                     Memory: Decreased short-term memory Following Commands: Follows one step commands consistently Safety/Judgement: Decreased awareness of safety;Decreased awareness of deficits Awareness: Emergent Problem Solving: Decreased initiation;Difficulty sequencing;Requires verbal cues;Requires tactile cues General Comments: Pt extremely anxious and did not respond to commands when anxiety escalated.  Pt self limiting due to anxiety.        Exercises      General Comments        Pertinent Vitals/Pain Pain Assessment: (P) Faces Faces Pain Scale: Hurts little more Pain Location: bil knees Pain Descriptors / Indicators: Aching Pain Intervention(s): Monitored during session;Repositioned    Home Living                      Prior Function            PT Goals (current goals can now be found in the care plan section) Acute Rehab PT Goals Patient Stated Goal: to get better Potential to Achieve Goals: Good Progress towards PT goals: Progressing toward goals    Frequency    Min 3X/week      PT Plan Current plan remains appropriate    Co-evaluation PT/OT/SLP Co-Evaluation/Treatment: Yes Reason for Co-Treatment: Necessary to address cognition/behavior during functional activity;For patient/therapist safety PT goals addressed during session: Mobility/safety with mobility;Balance OT goals addressed during session: Strengthening/ROM      AM-PAC PT "6 Clicks" Daily Activity  Outcome Measure  Difficulty turning over in bed (including adjusting bedclothes, sheets and blankets)?: Unable Difficulty moving from lying on back to sitting on the side of the bed? : Unable Difficulty sitting down on and standing up from a chair with arms (e.g., wheelchair, bedside commode, etc,.)?: Unable Help needed moving to and from a bed to chair (including a wheelchair)?: Total Help needed walking in hospital  room?: Total Help needed climbing 3-5 steps with a railing? : Total 6 Click Score: 6    End of Session Equipment Utilized During Treatment: Gait belt Activity Tolerance: Other (comment)(remains highly anxious which limits progression of mobility.  ) Patient left: in bed;with call bell/phone within reach;with nursing/sitter in room Nurse Communication: Mobility status(RN present for transfer back to bed and aware of situation and the loss of the IV site in her L UE.  ) PT Visit Diagnosis: Unsteadiness on feet (R26.81);Other abnormalities of gait and mobility (R26.89);Muscle weakness (generalized) (M62.81);Other symptoms and signs involving the nervous system (R29.898)     Time: [id number] PT Time Calculation (min) (ACUTE ONLY): 40 min  Charges:  $Therapeutic Activity: 23-37 mins                    G CodesJoycelyn Golden:       Debbie Golden, PTA pager [phone]    Debbie Golden 01/25/2018, 12:36 PM

## 2018-01-26 LAB — CBC
HEMATOCRIT: 25.8 % — AB (ref 36.0–46.0)
HEMOGLOBIN: 8.5 g/dL — AB (ref 12.0–15.0)
MCH: 41.1 pg — ABNORMAL HIGH (ref 26.0–34.0)
MCHC: 32.9 g/dL (ref 30.0–36.0)
MCV: 124.6 fL — AB (ref 78.0–100.0)
Platelets: 308 10*3/uL (ref 150–400)
RBC: 2.07 MIL/uL — AB (ref 3.87–5.11)
RDW: 19.3 % — ABNORMAL HIGH (ref 11.5–15.5)
WBC: 4.5 10*3/uL (ref 4.0–10.5)

## 2018-01-26 LAB — BASIC METABOLIC PANEL
ANION GAP: 6 (ref 5–15)
BUN: 5 mg/dL — ABNORMAL LOW (ref 6–20)
CALCIUM: 8 mg/dL — AB (ref 8.9–10.3)
CHLORIDE: 108 mmol/L (ref 101–111)
CO2: 22 mmol/L (ref 22–32)
Creatinine, Ser: 0.65 mg/dL (ref 0.44–1.00)
GFR calc Af Amer: >60 mL/min (ref >60)
GFR calc non Af Amer: >60 mL/min (ref >60)
GLUCOSE: 95 mg/dL (ref 65–99)
POTASSIUM: 3.3 mmol/L — AB (ref 3.5–5.1)
Sodium: 136 mmol/L (ref 135–145)

## 2018-01-26 MED ORDER — RISPERIDONE 0.5 MG PO TABS
0.5000 mg | ORAL_TABLET | Freq: Two times a day (BID) | ORAL | Status: DC | PRN
  Filled 2018-01-26: qty 1

## 2018-01-26 MED ORDER — POTASSIUM CHLORIDE CRYS ER 20 MEQ PO TBCR
40.0000 meq | EXTENDED_RELEASE_TABLET | Freq: Once | ORAL | Status: AC
  Administered 2018-01-26: 40 meq via ORAL
  Filled 2018-01-26: qty 2

## 2018-01-26 NOTE — Progress Notes (Signed)
Transferred to 4E11 via bed. Portable monitor on. No changes.

## 2018-01-26 NOTE — Progress Notes (Signed)
1st attempt to call report to 4E11 RN.

## 2018-01-26 NOTE — Progress Notes (Signed)
Report called to 4E11 RN Christian.

## 2018-01-26 NOTE — Procedures (Signed)
NIF >-40 VC 1.6L Good pt effort.

## 2018-01-26 NOTE — Progress Notes (Addendum)
Physical Therapy Treatment Patient Details Name: Debbie Golden MRN: [id number] DOB: Nov 29, 1971 Today's Date: 01/26/2018    History of Present Illness 47 y.o.femalewith medical history significant forbipolar disorder with history of catatonia in 2017, obesity, macrocytic anemia, history of genital herpes, and regular alcohol use presenting with reports of pain and numbness in both legs for hte past week. MRI of cervical and thoracic spine did not reveal explanation for pts symptoms. MRI lumbar spine pending. Review of CSF not consistent with Guillian-Barr. Pt started on IVIG on 1/23.    PT Comments    Pt performed increased activity during session.  Improved carryover with use of vital go tilt bed and patient able to tolerate standing x 10 min with weight shifting and reaching outside BOS.  Assistance to position in bed pre and post session. Post session patient placed into chair position.  BP pre session: 129/96 BP post session: 131/90   Follow Up Recommendations  SNF;Supervision/Assistance - 24 hour     Equipment Recommendations  None recommended by PT(TBA at next venue)    Recommendations for Other Services       Precautions / Restrictions Precautions Precautions: Fall Precaution Comments: knees buckle, pt very anxious with mobility Restrictions Weight Bearing Restrictions: No    Mobility  Bed Mobility Overal bed mobility: Needs Assistance             General bed mobility comments: For scoot in supine only to adjust and place straps to prepare for tilt therapy.    Transfers Overall transfer level: Needs assistance Equipment used: 2 person hand held assist(once in standing to improve anxiety.  ) Transfers: (Pt tilted into standing with total assistance of lift bed.  )           General transfer comment: Pt positioned and safety straps placed for tilt into standing.  Pt unable to follow commands during previous session to facilitate standing, therfore the  utilization ofi the tilt bed allowed patient to stand for a 10 min trial.  During standing after 5 min tolerance chest and thigh strap loosened.  Pt performed standing reaching  and weight shifting in standing.  Pt fatigued post session.    Ambulation/Gait Ambulation/Gait assistance: (Not safe to progress to gait at this time.  )               Stairs            Wheelchair Mobility    Modified Rankin (Stroke Patients Only)       Balance Overall balance assessment: Needs assistance           Standing balance-Leahy Scale: Poor Standing balance comment: required use of tilt frame and straps for support but able to loosen chest and thigh strap to perform weight shifting and reaching in standing on foot plate of bed.                              Cognition Arousal/Alertness: Awake/alert Behavior During Therapy: Anxious(less anxious during session today) Overall Cognitive Status: No family/caregiver present to determine baseline cognitive functioning Area of Impairment: Following commands;Safety/judgement;Awareness;Problem solving;Memory                     Memory: Decreased short-term memory Following Commands: Follows one step commands inconsistently Safety/Judgement: Decreased awareness of safety;Decreased awareness of deficits Awareness: Emergent Problem Solving: Decreased initiation;Difficulty sequencing;Requires verbal cues;Requires tactile cues General Comments: anxiety better during session but remains to  require step by step cues to avoid escalation of anxiety.        Exercises      General Comments        Pertinent Vitals/Pain Pain Assessment: Faces Faces Pain Scale: Hurts even more Pain Location: L hand and arm Pain Descriptors / Indicators: Sore Pain Intervention(s): Monitored during session;Repositioned    Home Living                      Prior Function            PT Goals (current goals can now be found in the  care plan section) Acute Rehab PT Goals Patient Stated Goal: to get better Potential to Achieve Goals: Good Progress towards PT goals: Progressing toward goals    Frequency    Min 3X/week      PT Plan Current plan remains appropriate    Co-evaluation              AM-PAC PT "6 Clicks" Daily Activity  Outcome Measure  Difficulty turning over in bed (including adjusting bedclothes, sheets and blankets)?: Unable Difficulty moving from lying on back to sitting on the side of the bed? : Unable Difficulty sitting down on and standing up from a chair with arms (e.g., wheelchair, bedside commode, etc,.)?: Unable Help needed moving to and from a bed to chair (including a wheelchair)?: Total Help needed walking in hospital room?: Total Help needed climbing 3-5 steps with a railing? : Total 6 Click Score: 6    End of Session   Activity Tolerance: Other (comment);Patient tolerated treatment well(anxiety remains) Patient left: in bed;with call bell/phone within reach;with nursing/sitter in room Nurse Communication: Mobility status(need for tilt therapy to improve function.  ) PT Visit Diagnosis: Unsteadiness on feet (R26.81);Other abnormalities of gait and mobility (R26.89);Muscle weakness (generalized) (M62.81);Other symptoms and signs involving the nervous system (R29.898)     Time: 1610-9604 PT Time Calculation (min) (ACUTE ONLY): 43 min  Charges:  $Therapeutic Activity: 38-52 mins                    G Codes:       Joycelyn Rua, PTA pager [phone]    Florestine Avers 01/26/2018, 5:03 PM

## 2018-01-26 NOTE — Progress Notes (Addendum)
PROGRESS NOTE    Debbie Golden  NLG:[id number] DOB: Oct 04, 1971 DOA: 01/20/2018 PCP: Patient, No Pcp Per   Chief Complaint  Patient presents with  . Knee Pain  . Weakness    Brief Narrative:  HPI on 01/20/2018 by Ms. Debbie Hearing, NP Debbie Golden is a 47 y.o. female with medical history significant for bipolar disorder with history of catatonia in 2017, obesity, macrocytic anemia, history of genital herpes, and regular alcohol use.  Patient presented to [facility] on the afternoon of 1/22 reporting pain and numbness in both legs greater in the left leg ongoing for a week.  She reports chronic back problems and had a steroid injection 1 week prior.  She reported increased difficulty with ambulation at home and no recent trauma.  When I questioned the patient she reports she has been having difficulty ambulating and weakness/numbness/tingling in the legs waxing and waning for 1 month.  CT of the head was unremarkable.  Due to concerns of possible Guillain- Barr patient was subsequently transferred to this facility so she can undergo a lumbar puncture.  After arrival to [facility] she was evaluated by neurology who documented on exam patient had absent generalized DTRs, mild bilateral lower extremity motor weakness averaging 4/5 from the hips down with decreased wrist flexion, extension, abduction bilaterally at 4/5 as well.  She was not exhibiting any respiratory symptoms or signs of bowel or bladder incontinence or urinary retention.  MRI of the cervical and thoracic spine did not reveal explanation for patient's current symptoms.  MR lumbar spine pending.  Subsequent review of CSF is not consistent with Guillian-Barr.  She will be admitted to the stepdown unit for further monitoring and treatment.  Interim history Neurology consulted, recommended 5 days for IVIG. Psychiatry consulted to aid with patient's hallucinations. PCCM consulted for possible Precedex use, patient was transferred to ICU on 01/22/2018 and  started on Precedex, which has since been discontinued. Continues to have some hallucinations. Needs SNF at discharge  Assessment & Plan   Bilateral lower extremity weakness, distal upper extremity weakness -Neurology consulted and appreciated, suspecting possible Ethelene Hal -MRI cervical and thoracic spine normal -MRI lumbar spine, lower thoracic spinal cord and cauda equina. No evidence of intradural inflammation. Chronic lumbar disc degeneration L5-S1. Small lumbar disc herniation L1-L2. -CSF - LP was a traumatic tap. Reviewed with neurology, no pleocytosis, protein was not elevated -Neurology consulted and appreciated, recommended 5 days IVIG, today 5/5 -PT, OT consulted and appreciated- recommended SNF. 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 Reports of approximate 2 drinks every other day versus daily. Discussed with patient's boyfriend, who states she has a drink occasionally with him. However he also admits that he is not home all the time. -Patient was placed on CIWA protocol however agitation worsened with Ativan administration. -continue thiamine, folic acid, multivitamin -PCCM consulted and appreciated for Precedex administration. Patient was transferred to the ICU. Will not transfer patient to stepdown unit and continue to monitor. -Discussed with PCCM, feels that patient may be having benzo withdrawal versus alcohol at all. Recommended  using low-dose  benzodiazepines. -Will discuss with psychiatry again today -MRI brain normal -mental status has improved -Continue low dose librium scheduled, would taper over the next couple of days -Psychiatry recommended risperdal 0.18m BID PRN for hallucinations. May benefit from resources for alcohol abuse following discharge  Obesity  -History of gastric bypass  Bipolar disorder -Continue Lamictal, Risperdal -psychiatry consulted and appreciated  Abnormal TSH -TSH 4.753, free T4 1.15 -Would recheck thyroid function test in 4-6 weeks.   Constipation -Continue colace  DVT Prophylaxis  Lovenox  Code Status: Full  Family Communication: None at bedside  Disposition Plan: Admitted. Will transfer to tele. Continue to monitor. SNF at discharge    Consultants Neurology  Psychiatry PCCM  Procedures  Lumbar puncture  Antibiotics   Anti-infectives (From admission, onward)   None      Subjective:   Debbie Golden and examined today.  Denies further hallucinations today. Denies chest pain, shortness of breath, abdominal pain, N/V/D/C. Still complains of lower extremity weakness.   Objective:   Vitals:   01/26/18 0748 01/26/18 0800 01/26/18 1200 01/26/18 1313  BP:  (!) 145/80 136/86   Pulse:      Resp:  17 18   Temp: 99 F (37.2 C)   98.5 F (36.9 C)  TempSrc: Oral   Oral  SpO2:      Weight:      Height:        Intake/Output Summary (Last 24 hours) at 01/26/2018 1344 Last data filed at 01/26/2018 0400 Gross per 24 hour  Intake 375 ml  Output 1400 ml  Net -1025 ml   Filed Weights   01/23/18 0439 01/24/18 0423 01/25/18 0500  Weight: 110.9 kg (244 lb 7.8 oz) 109.6 kg (241 lb 10 oz) 105.8 kg (233 lb 4 oz)   Exam  General: Well developed, well nourished, NAD  HEENT: NCAT, mucous membranes moist.   Cardiovascular: S1 S2 auscultated, RRR, no murmur  Respiratory: Clear to auscultation bilaterally with equal chest rise. No wheezing   Abdomen: Soft,  nontender, nondistended, + bowel sounds  Extremities: warm dry without cyanosis clubbing or edema  Neuro: AAOx3, nonfocal, LE weakness  Psych: appropriate mood and affect  Data Reviewed: I have personally reviewed following labs and imaging studies  CBC: Recent Labs  Lab 01/19/18 1924 01/21/18 0349 01/22/18 0502 01/23/18 0351 01/25/18 0244 01/26/18 0233  WBC 9.5 7.2 7.2 6.5 5.1 4.5  NEUTROABS 7.4*  --   --   --   --   --   HGB 9.5* 8.2* 8.0* 8.1* 8.3* 8.5*  HCT 27.6* 24.7* 24.3* 25.4* 25.5* 25.8*  MCV 121.9* 122.9* 124.0* 125.7* 124.4* 124.6*  PLT 331 279 251 251 287 3694  Basic Metabolic Panel: Recent Labs  Lab 01/20/18 0218 01/20/18 1153 01/21/18 0349 01/22/18 0502 01/23/18 0351 01/25/18 0244 01/26/18 0233  NA 138 138 138 138 140 133* 136  K 2.9* 3.5 3.7 3.6 4.2 3.7 3.3*  CL 103 105 107 110 113* 104 108  CO2 23 20* 21* 19* 20* 20* 22  GLUCOSE 101* 132* 97 97 118* 88 95  BUN 9 6 6  5* <5* <5* 5*  CREATININE 0.64 0.77 0.67 0.65 0.63 0.68 0.65  CALCIUM 8.1* 8.0* 7.7* 7.8* 8.3* 8.3* 8.0*  MG 1.9  --   --  1.8  --  1.9  --   PHOS  --  2.0* 2.2* 2.4*  --  3.5  --    GFR: Estimated Creatinine Clearance: 108.8 mL/min (by C-G formula based  on SCr of 0.65 mg/dL). Liver Function Tests: Recent Labs  Lab 01/21/18 0349  AST 125*  ALT 44  ALKPHOS 190*  BILITOT 3.0*  PROT 6.6  ALBUMIN 2.4*   No results for input(s): LIPASE, AMYLASE in the last 168 hours. No results for input(s): AMMONIA in the last 168 hours. Coagulation Profile: No results for input(s): INR, PROTIME in the last 168 hours. Cardiac Enzymes: Recent Labs  Lab 01/22/18 1718  CKTOTAL 62   BNP (last 3 results) No results for input(s): PROBNP in the last 8760 hours. HbA1C: No results for input(s): HGBA1C in the last 72 hours. CBG: Recent Labs  Lab 01/22/18 1534  GLUCAP 114*   Lipid Profile: No results for input(s): CHOL, HDL, LDLCALC, TRIG, CHOLHDL, LDLDIRECT in the last 72 hours. Thyroid  Function Tests: No results for input(s): TSH, T4TOTAL, FREET4, T3FREE, THYROIDAB in the last 72 hours. Anemia Panel: No results for input(s): VITAMINB12, FOLATE, FERRITIN, TIBC, IRON, RETICCTPCT in the last 72 hours. Urine analysis:    Component Value Date/Time   COLORURINE AMBER (A) 01/19/2018 2058   APPEARANCEUR HAZY (A) 01/19/2018 2058   LABSPEC 1.013 01/19/2018 2058   PHURINE 6.0 01/19/2018 2058   GLUCOSEU NEGATIVE 01/19/2018 2058   HGBUR NEGATIVE 01/19/2018 2058   BILIRUBINUR NEGATIVE 01/19/2018 2058   KETONESUR NEGATIVE 01/19/2018 2058   PROTEINUR NEGATIVE 01/19/2018 2058   NITRITE NEGATIVE 01/19/2018 2058   LEUKOCYTESUR NEGATIVE 01/19/2018 2058   Sepsis Labs: @LABRCNTIP (procalcitonin:4,lacticidven:4)  ) Recent Results (from the past 240 hour(s))  CSF culture     Status: None   Collection Time: 01/20/18  3:55 AM  Result Value Ref Range Status   Specimen Description CSF  Final   Special Requests NONE  Final   Gram Stain   Final    WBC PRESENT, PREDOMINANTLY MONONUCLEAR NO ORGANISMS SEEN CYTOSPIN SMEAR    Culture NO GROWTH 3 DAYS  Final   Report Status 01/23/2018 FINAL  Final  MRSA PCR Screening     Status: None   Collection Time: 01/20/18 11:33 AM  Result Value Ref Range Status   MRSA by PCR NEGATIVE NEGATIVE Final    Comment:        The GeneXpert MRSA Assay (FDA approved for NASAL specimens only), is one component of a comprehensive MRSA colonization surveillance program. It is not intended to diagnose MRSA infection nor to guide or monitor treatment for MRSA infections.       Radiology Studies: No results found.   Scheduled Meds: . aspirin EC  325 mg Oral Daily  . chlordiazePOXIDE  5 mg Oral TID  . enoxaparin (LOVENOX) injection  40 mg Subcutaneous Q24H  . folic acid  1 mg Oral Daily  . lamoTRIgine  100 mg Oral Daily  . multivitamin with minerals  1 tablet Oral Daily  . pyridOXINE  100 mg Intravenous Daily  . risperiDONE  2 mg Oral QHS  .  thiamine  100 mg Oral Daily   Continuous Infusions:    LOS: 6 days   Time Spent in minutes   30 minutes  Debbie Golden D.O. on 01/26/2018 at 1:44 PM  Between 7am to 7pm - Pager - [phone]  After 7pm go to [email or web address] - password [email or web address]  And look for the night coverage person covering for me after hours  [facility] Office  [phone]

## 2018-01-27 LAB — BASIC METABOLIC PANEL
Anion gap: 6 (ref 5–15)
BUN: 6 mg/dL (ref 6–20)
CHLORIDE: 108 mmol/L (ref 101–111)
CO2: 22 mmol/L (ref 22–32)
Calcium: 8.2 mg/dL — ABNORMAL LOW (ref 8.9–10.3)
Creatinine, Ser: 0.59 mg/dL (ref 0.44–1.00)
GFR calc Af Amer: >60 mL/min (ref >60)
GFR calc non Af Amer: >60 mL/min (ref >60)
GLUCOSE: 88 mg/dL (ref 65–99)
POTASSIUM: 3.7 mmol/L (ref 3.5–5.1)
Sodium: 136 mmol/L (ref 135–145)

## 2018-01-27 LAB — CBC
HCT: 25.6 % — ABNORMAL LOW (ref 36.0–46.0)
HEMOGLOBIN: 8.5 g/dL — AB (ref 12.0–15.0)
MCH: 41.1 pg — AB (ref 26.0–34.0)
MCHC: 33.2 g/dL (ref 30.0–36.0)
MCV: 123.7 fL — AB (ref 78.0–100.0)
Platelets: 391 10*3/uL (ref 150–400)
RBC: 2.07 MIL/uL — AB (ref 3.87–5.11)
RDW: 18.5 % — ABNORMAL HIGH (ref 11.5–15.5)
WBC: 5.9 10*3/uL (ref 4.0–10.5)

## 2018-01-27 LAB — PHOSPHORUS: Phosphorus: 2.8 mg/dL (ref 2.5–4.6)

## 2018-01-27 LAB — MAGNESIUM: Magnesium: 2.1 mg/dL (ref 1.7–2.4)

## 2018-01-27 NOTE — Progress Notes (Signed)
CSW spoke with patient spouse Alinda Moneyony via phone. Alinda Moneyony stated he would like patient to go to [facility] located in [place] since it would be closer to their home. CSW spoke to admissions coordinator Jomarie LongsJoseph and he stated they are able to take patient once authorization through insurance has been attained. Jomarie LongsJoseph stated he will start the authorization process and will reach out to CSW once he has finished.   Marrianne MoodAshley Jaevian Shean, MSW,  [employer] [phone]

## 2018-01-27 NOTE — Progress Notes (Signed)
[facility]                                                                              Patient Demographics  Debbie Golden, is a 47 y.o. female, DOB - [birth year], ZOX:[id number]  Admit date - 01/20/2018   Admitting Physician Pieter Partridge, MD  Outpatient Primary MD for the patient is Patient, No Pcp Per  Outpatient specialists:   LOS - 7  days   Medical records reviewed and are as summarized below:    Chief Complaint  Patient presents with  . Knee Pain  . Weakness       Brief summary   HPI on 01/20/2018 by [name], NP [name] a 47 y.o.femalewith medical history significant forbipolar disorder with history of catatonia in 2017, obesity, macrocytic anemia, history of genital herpes, and regular alcohol use. Patient presented to [facility] the afternoon of 1/22 reporting pain and numbness in both legs greater in the left leg ongoing for a week. She reports chronic back problems and had a steroid injection 1 week prior. She reported increased difficulty with ambulation at home and no recent trauma. When I questioned the patient she reports she has been having difficulty ambulating and weakness/numbness/tingling in the legs waxing and waning for 1 month. CT of the head was unremarkable. Due to concerns of possible Guillain-Barr patient was subsequently transferred to this facility so she can undergo a lumbar puncture. After arrival to [facility] she was evaluated by neurology who documented on exam patient had absent generalized DTRs, mild bilateral lower extremity motor weakness averaging 4/5 from the hips down with decreased wrist flexion, extension, abduction bilaterally at 4/5 as well. She was not exhibiting any respiratory symptoms or signs of bowel or bladder incontinence or urinary retention. MRI of the cervical and thoracic spine did not reveal explanation for patient's current symptoms. Subsequent review of CSF is not consistent with  Guillian-Barr.Patient was admitted for further workup    Assessment & Plan    Principal Problem: Acute metabolic encephalopathy with underlying alcohol use, questionable withdrawal -Currently alert and oriented x3,  - patient was placed on CIWA call with Ativan however agitation appears to have worsened with Ativan administration. P CCM was consulted and patient was transferred to ICU for Precedex administration -MRI brain showed no acute intracranial pathology, mental status has been improving - psychiatry was also consulted and recommended Risperdal 0.5 mg BID PRN for hallucinations, low-dose Librium scheduled and able over the next couple of days, will taper to 5 mg twice a day in am    Active Problems:   Leg weakness, bilateral -Neurology was consulted initially suspecting possible Guillain-Barr syndrome. -MRI cervical, thoracic spine normal.  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, no associated spinal stenosis, small lumbar disc herniation at L1-2 without convincing neural impinge -Patient underwent spinal tap, no pleocytosis, protein was not elevated, neurology recommended 5 days IVIG, finished 1/29 -Awaiting skilled nursing facility bed     Obesity, history of gastric bypass -BMI 35.0 -Counseled on  diet and weight control    Bipolar disorder (HCC) -Continue Lamictal, Risperdal    Macrocytic anemia -Possibly due to alcohol use, AST > ALT and folate deficiency -B12 401, folate low 2.4, placed on folic acid daily -Vitamin B6 level low, 1.8, currently on IV pyridoxine, will transition to oral pyridoxine 100 mg daily at discharge   Abnormal TSH TSH 4.7, free T4 1.15, recheck thyroid function outpatient in 4-6 weeks  Code Status: Full code DVT Prophylaxis:  Lovenox Family Communication: Discussed in detail with the patient, all  imaging results, lab results explained to the patient or *   Disposition Plan: Awaiting skilled nursing facility bed  Time Spent in minutes   35 minutes  Procedures:    Consultants:   Neurology Psychiatry  Antimicrobials:      Medications  Scheduled Meds: . aspirin EC  325 mg Oral Daily  . chlordiazePOXIDE  5 mg Oral TID  . enoxaparin (LOVENOX) injection  40 mg Subcutaneous Q24H  . folic acid  1 mg Oral Daily  . lamoTRIgine  100 mg Oral Daily  . multivitamin with minerals  1 tablet Oral Daily  . pyridOXINE  100 mg Intravenous Daily  . risperiDONE  2 mg Oral QHS  . thiamine  100 mg Oral Daily   Continuous Infusions: PRN Meds:.acetaminophen **OR** acetaminophen, docusate sodium, LORazepam, ondansetron **OR** ondansetron (ZOFRAN) IV, risperiDONE   Antibiotics   Anti-infectives (From admission, onward)   None        Subjective:   [name] was seen and examined today.  Feels bilateral lower extremity weakness is improving.  No acute issues.  No fevers.  Patient denies dizziness, chest pain, shortness of breath, abdominal pain, N/V/D/C. No acute events overnight.    Objective:   Vitals:   01/26/18 1800 01/26/18 1900 01/26/18 2006 01/27/18 0421  BP:   130/87   Pulse: (!) 114 (!) 106 (!) 105   Resp: 19 (!) 25 20 19   Temp:   98.4 F (36.9 C)   TempSrc:   Oral   SpO2: 99% 100% 100%   Weight:    101.6 kg (224 lb)  Height:        Intake/Output Summary (Last 24 hours) at 01/27/2018 1121 Last data filed at 01/27/2018 0903 Gross per 24 hour  Intake 536 ml  Output -  Net 536 ml     Wt Readings from Last 3 Encounters:  01/27/18 101.6 kg (224 lb)  01/19/18 101.6 kg (224 lb)  11/09/16 83 kg (183 lb)     Exam  General: Alert and oriented x 3, NAD  Eyes:   HEENT:  Atraumatic, normocephalic  Cardiovascular: S1 S2 auscultated, no rubs, murmurs or gallops. Regular rate and rhythm.  Respiratory: Clear to auscultation bilaterally, no wheezing, rales or  rhonchi  Gastrointestinal: Soft, nontender, nondistended, + bowel sounds  Ext: no pedal edema bilaterally  Neuro: AAOx3, mild lower extremity weakness 4/5 improving  Musculoskeletal: No digital cyanosis, clubbing  Skin: No rashes  Psych: Normal affect and demeanor, alert and oriented x3    Data Reviewed:  I have personally reviewed following labs and imaging studies  Micro Results Recent Results (from the past 240 hour(s))  CSF culture     Status: None   Collection Time: 01/20/18  3:55 AM  Result Value Ref Range Status   Specimen Description CSF  Final   Special Requests NONE  Final   Gram Stain   Final    WBC PRESENT, PREDOMINANTLY MONONUCLEAR NO ORGANISMS SEEN  CYTOSPIN SMEAR    Culture NO GROWTH 3 DAYS  Final   Report Status 01/23/2018 FINAL  Final  MRSA PCR Screening     Status: None   Collection Time: 01/20/18 11:33 AM  Result Value Ref Range Status   MRSA by PCR NEGATIVE NEGATIVE Final    Comment:        The GeneXpert MRSA Assay (FDA approved for NASAL specimens only), is one component of a comprehensive MRSA colonization surveillance program. It is not intended to diagnose MRSA infection nor to guide or monitor treatment for MRSA infections.     Radiology Reports Ct Head Wo Contrast  Result Date: 01/19/2018 CLINICAL DATA:  Pain and numbness in the legs. Symptoms have been going on for a week. EXAM: CT HEAD WITHOUT CONTRAST TECHNIQUE: Contiguous axial images were obtained from the base of the skull through the vertex without intravenous contrast. COMPARISON:  11/09/2016 FINDINGS: Brain: No evidence of acute infarction, hemorrhage, hydrocephalus, extra-axial collection or mass lesion/mass effect. Vascular: No hyperdense vessel or unexpected calcification. Skull: Normal. Negative for fracture or focal lesion. Sinuses/Orbits: Mucosal disease in the right sphenoid sinus. Other: None. IMPRESSION: No acute intracranial abnormality. Electronically Signed   By: Richarda Overlie M.D.   On: 01/19/2018 21:54   Mr Brain Wo Contrast  Result Date: 01/23/2018 CLINICAL DATA:  Lower extremity weakness.  No loosening a shins. EXAM: MRI HEAD WITHOUT CONTRAST TECHNIQUE: Multiplanar, multiecho pulse sequences of the brain and surrounding structures were obtained without intravenous contrast. COMPARISON:  Head CT 01/19/2018 FINDINGS: Brain: Brain has normal appearance without evidence of malformation, atrophy, old or acute small or large vessel infarction, mass lesion, hemorrhage, hydrocephalus or extra-axial collection. Vascular: Major vessels at the base of the brain show flow. Venous sinuses appear patent. Skull and upper cervical spine: Normal. Sinuses/Orbits: Mild seasonal mucosal thickening.  Orbits negative. Other: None significant. IMPRESSION: Normal MRI of the brain. Electronically Signed   By: Paulina Fusi M.D.   On: 01/23/2018 15:05   Mr Cervical Spine Wo Contrast  Result Date: 01/20/2018 CLINICAL DATA:  Initial evaluation for progressive ascending weakness and numbness. EXAM: MRI CERVICAL, THORACIC SPINE WITHOUT CONTRAST TECHNIQUE: Multiplanar and multiecho pulse sequences of the cervical spine, to include the craniocervical junction and cervicothoracic junction, and thoracic and lumbar spine, were obtained without intravenous contrast. COMPARISON:  None available. FINDINGS: MRI CERVICAL SPINE FINDINGS Alignment: Study moderately degraded by motion artifact. Straightening with slight reversal of the normal cervical lordosis, apex at C5. No listhesis. Vertebrae: Vertebral body heights are maintained without evidence for acute or chronic fracture. Bone marrow signal intensity within normal limits. No discrete or worrisome osseous lesions. No abnormal marrow edema. Cord: Signal intensity within cervical spinal cord within normal limits. No appreciable cord signal abnormality identified on this motion degraded exam. Posterior Fossa, vertebral arteries, paraspinal tissues: Visualized  brain within normal limits. Craniocervical junction normal. Scattered mucosal thickening noted with in the visualized paranasal sinuses. Paraspinous and prevertebral soft tissues within normal limits. Normal intravascular flow voids present within the vertebral arteries bilaterally. Disc levels: C2-C3: Unremarkable. C3-C4:  Unremarkable. C4-C5: Mild diffuse disc bulge with bilateral uncovertebral hypertrophy. Resultant mild right C5 foraminal stenosis. No significant canal narrowing. C5-C6: Chronic diffuse degenerative disc osteophyte with intervertebral disc space narrowing. Broad posterior component mildly flattens the ventral thecal sac without significant spinal stenosis. Moderate bilateral C6 foraminal stenosis, slightly worse on the right. C6-C7: Mild diffuse chronic disc osteophyte with intervertebral disc space narrowing. Flattening of the ventral thecal sac without  significant spinal stenosis. Mild bilateral C7 foraminal stenosis. C7-T1: Mild facet hypertrophy. Otherwise unremarkable without stenosis. MRI THORACIC SPINE FINDINGS Alignment: Vertebral bodies normally aligned with preservation of the normal thoracic kyphosis. No listhesis. Vertebrae: Vertebral body heights are maintained without evidence for acute or chronic fracture. Bone marrow signal intensity within normal limits. No discrete or worrisome osseous lesions. No abnormal marrow edema. Cord: Signal intensity within the thoracic spinal cord within normal limits. No cord signal abnormality seen on this motion degraded study. Conus medullaris terminates at the L1 level. Overall cord caliber normal. Paraspinal and other soft tissues: Paraspinous soft tissues within normal limits. Partially visualized lungs are grossly clear. Visualized visceral structures are unremarkable. Disc levels: T7-8: Mild disc bulge without significant stenosis. L1-2: Seen only on sagittal projection. Disc bulge without significant canal stenosis. No foraminal encroachment.  No other significant degenerative changes seen within the thoracic spine. No significant canal or neural foraminal stenosis. IMPRESSION: MRI CERVICAL SPINE IMPRESSION: 1. Normal MRI appearance of the cervical spinal cord. 2. 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 Mild to moderate bilateral foraminal narrowing at these levels as above. MRI THORACIC SPINE IMPRESSION: 1. Normal MRI appearance of the thoracic spinal cord. 2. Minimal disc bulging at T7-8 without stenosis. No other significant degenerative changes within the thoracic spine. No significant stenosis. Electronically Signed   By: Rise Mu M.D.   On: 01/20/2018 06:42   Mr Thoracic Spine Wo Contrast  Result Date: 01/20/2018 CLINICAL DATA:  Initial evaluation for progressive ascending weakness and numbness. EXAM: MRI CERVICAL, THORACIC SPINE WITHOUT CONTRAST TECHNIQUE: Multiplanar and multiecho pulse sequences of the cervical spine, to include the craniocervical junction and cervicothoracic junction, and thoracic and lumbar spine, were obtained without intravenous contrast. COMPARISON:  None available. FINDINGS: MRI CERVICAL SPINE FINDINGS Alignment: Study moderately degraded by motion artifact. Straightening with slight reversal of the normal cervical lordosis, apex at C5. No listhesis. Vertebrae: Vertebral body heights are maintained without evidence for acute or chronic fracture. Bone marrow signal intensity within normal limits. No discrete or worrisome osseous lesions. No abnormal marrow edema. Cord: Signal intensity within cervical spinal cord within normal limits. No appreciable cord signal abnormality identified on this motion degraded exam. Posterior Fossa, vertebral arteries, paraspinal tissues: Visualized brain within normal limits. Craniocervical junction normal. Scattered mucosal thickening noted with in the visualized paranasal sinuses. Paraspinous and prevertebral soft tissues within  normal limits. Normal intravascular flow voids present within the vertebral arteries bilaterally. Disc levels: C2-C3: Unremarkable. C3-C4:  Unremarkable. C4-C5: Mild diffuse disc bulge with bilateral uncovertebral hypertrophy. Resultant mild right C5 foraminal stenosis. No significant canal narrowing. C5-C6: Chronic diffuse degenerative disc osteophyte with intervertebral disc space narrowing. Broad posterior component mildly flattens the ventral thecal sac without significant spinal stenosis. Moderate bilateral C6 foraminal stenosis, slightly worse on the right. C6-C7: Mild diffuse chronic disc osteophyte with intervertebral disc space narrowing. Flattening of the ventral thecal sac without significant spinal stenosis. Mild bilateral C7 foraminal stenosis. C7-T1: Mild facet hypertrophy. Otherwise unremarkable without stenosis. MRI THORACIC SPINE FINDINGS Alignment: Vertebral bodies normally aligned with preservation of the normal thoracic kyphosis. No listhesis. Vertebrae: Vertebral body heights are maintained without evidence for acute or chronic fracture. Bone marrow signal intensity within normal limits. No discrete or worrisome osseous lesions. No abnormal marrow edema. Cord: Signal intensity within the thoracic spinal cord within normal limits. No cord signal abnormality seen on this motion degraded study. Conus medullaris terminates at the L1 level. Overall cord caliber normal. Paraspinal  and other soft tissues: Paraspinous soft tissues within normal limits. Partially visualized lungs are grossly clear. Visualized visceral structures are unremarkable. Disc levels: T7-8: Mild disc bulge without significant stenosis. L1-2: Seen only on sagittal projection. Disc bulge without significant canal stenosis. No foraminal encroachment. No other significant degenerative changes seen within the thoracic spine. No significant canal or neural foraminal stenosis. IMPRESSION: MRI CERVICAL SPINE IMPRESSION: 1. Normal MRI  appearance of the cervical spinal cord. 2. 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 Mild to moderate bilateral foraminal narrowing at these levels as above. MRI THORACIC SPINE IMPRESSION: 1. Normal MRI appearance of the thoracic spinal cord. 2. Minimal disc bulging at T7-8 without stenosis. No other significant degenerative changes within the thoracic spine. No significant stenosis. Electronically Signed   By: Rise MuBenjamin  McClintock M.D.   On: 01/20/2018 06:42   Mr Lumbar Spine W Wo Contrast  Result Date: 01/20/2018 CLINICAL DATA:  47 year old female with progressive ascending weakness and numbness. EXAM: MRI LUMBAR SPINE WITHOUT AND WITH CONTRAST TECHNIQUE: Multiplanar and multiecho pulse sequences of the lumbar spine were obtained without and with intravenous contrast. CONTRAST:  20mL MULTIHANCE GADOBENATE DIMEGLUMINE 529 MG/ML IV SOLN COMPARISON:  Cervical and thoracic spine MRI 0534 hr today. Lumbar puncture images 0 347 hr today. FINDINGS: Segmentation: Normal lumbar segmentation demonstrated on the LP images. This numbering appears concordant with the cervical and thoracic spine numbering earlier today. Alignment:  Preserved lumbar lordosis. Vertebrae: Chronic degenerative endplate marrow signal changes at L5-S1. Elsewhere Visualized bone marrow signal is within normal limits. No marrow edema or evidence of acute osseous abnormality. Intact visible sacrum and SI joints. Conus medullaris and cauda equina: Conus extends to the T12-L1 level. Normal conus. No abnormal intradural enhancement. No thickening of the cauda equina, nerve roots appear normal. Paraspinal and other soft tissues: Negative visualized abdominal viscera. Posterior paraspinal soft tissues are within normal limits; there is a degree of diffuse paraspinal muscle atrophy. Disc levels: T12-L1:  Negative. L1-L2: Small central to slightly left paracentral disc herniation (series 6, image 10). No  associated stenosis. L2-L3:  Mild facet hypertrophy.  No stenosis. L3-L4:  Mild facet hypertrophy.  No stenosis. L4-L5:  Mild facet hypertrophy.  No stenosis. L5-S1: Advanced disc space loss. Probable vacuum disc. Circumferential disc osteophyte complex with broad-based posterior component. Mild facet hypertrophy. Mild epidural lipomatosis. No significant spinal stenosis or convincing lateral recess stenosis. There is moderate left and mild right L5 neural foraminal stenosis. IMPRESSION: 1. Normal lower thoracic spinal cord and cauda equina. No evidence of intradural inflammation. 2. Advanced chronic lumbar disc degeneration at L5-S1. No associated spinal stenosis, but mild to moderate L5 neural foraminal stenosis greater on the left. 3. Small lumbar disc herniation at L1-L2 without convincing neural impingement. 4.  No acute osseous abnormality. Electronically Signed   By: Odessa FlemingH  Hall M.D.   On: 01/20/2018 11:46   Dg Fluoro Guide Lumbar Puncture  Result Date: 01/20/2018 CLINICAL DATA:  Numbness in the legs and arms. EXAM: DIAGNOSTIC LUMBAR PUNCTURE UNDER FLUOROSCOPIC GUIDANCE FLUOROSCOPY TIME:  Fluoroscopy Time:  1 minute 0 seconds. Radiation Exposure Index (if provided by the fluoroscopic device): Dose area product is 270 micro Gy meter square. Reference air kerma is 21 mGy Number of Acquired Spot Images: 1 PROCEDURE: Informed consent was obtained from the patient prior to the procedure, including potential complications of headache, allergy, and pain. With the patient prone, the lower back was prepped with Betadine. 1% Lidocaine was used for local anesthesia. Lumbar  puncture was performed at the L4-5 level using a 20 gauge needle with return of clear CSF. 5 ml of CSF were obtained for laboratory studies. During collection for the third tube, the patient moved, dislodging the needle and the procedure was ended. The last of the fluid became blood-tinged after the needle was dislodged. Opening pressure was not  obtained as the patient was unable to tolerate positioning for opening pressure. Subjectively, the pressure was not elevated. The patient tolerated the procedure well and there were no apparent complications. Patient complained of loss of feeling in her legs both before and after the procedure without significant change. IMPRESSION: Lumbar puncture performed under fluoroscopy at the L4-5 level. 5 mL clear CSF was obtained. Patient tolerated the procedure well without complication. Electronically Signed   By: Burman Nieves M.D.   On: 01/20/2018 04:21    Lab Data:  CBC: Recent Labs  Lab 01/22/18 0502 01/23/18 0351 01/25/18 0244 01/26/18 0233 01/27/18 0617  WBC 7.2 6.5 5.1 4.5 5.9  HGB 8.0* 8.1* 8.3* 8.5* 8.5*  HCT 24.3* 25.4* 25.5* 25.8* 25.6*  MCV 124.0* 125.7* 124.4* 124.6* 123.7*  PLT 251 251 287 308 391   Basic Metabolic Panel: Recent Labs  Lab 01/20/18 1153 01/21/18 0349 01/22/18 0502 01/23/18 0351 01/25/18 0244 01/26/18 0233 01/27/18 0617  NA 138 138 138 140 133* 136 136  K 3.5 3.7 3.6 4.2 3.7 3.3* 3.7  CL 105 107 110 113* 104 108 108  CO2 20* 21* 19* 20* 20* 22 22  GLUCOSE 132* 97 97 118* 88 95 88  BUN 6 6 5* <5* <5* 5* 6  CREATININE 0.77 0.67 0.65 0.63 0.68 0.65 0.59  CALCIUM 8.0* 7.7* 7.8* 8.3* 8.3* 8.0* 8.2*  MG  --   --  1.8  --  1.9  --  2.1  PHOS 2.0* 2.2* 2.4*  --  3.5  --  2.8   GFR: Estimated Creatinine Clearance: 106.5 mL/min (by C-G formula based on SCr of 0.59 mg/dL). Liver Function Tests: Recent Labs  Lab 01/21/18 0349  AST 125*  ALT 44  ALKPHOS 190*  BILITOT 3.0*  PROT 6.6  ALBUMIN 2.4*   No results for input(s): LIPASE, AMYLASE in the last 168 hours. No results for input(s): AMMONIA in the last 168 hours. Coagulation Profile: No results for input(s): INR, PROTIME in the last 168 hours. Cardiac Enzymes: Recent Labs  Lab 01/22/18 1718  CKTOTAL 62   BNP (last 3 results) No results for input(s): PROBNP in the last 8760  hours. HbA1C: No results for input(s): HGBA1C in the last 72 hours. CBG: Recent Labs  Lab 01/22/18 1534  GLUCAP 114*   Lipid Profile: No results for input(s): CHOL, HDL, LDLCALC, TRIG, CHOLHDL, LDLDIRECT in the last 72 hours. Thyroid Function Tests: No results for input(s): TSH, T4TOTAL, FREET4, T3FREE, THYROIDAB in the last 72 hours. Anemia Panel: No results for input(s): VITAMINB12, FOLATE, FERRITIN, TIBC, IRON, RETICCTPCT in the last 72 hours. Urine analysis:    Component Value Date/Time   COLORURINE AMBER (A) 01/19/2018 2058   APPEARANCEUR HAZY (A) 01/19/2018 2058   LABSPEC 1.013 01/19/2018 2058   PHURINE 6.0 01/19/2018 2058   GLUCOSEU NEGATIVE 01/19/2018 2058   HGBUR NEGATIVE 01/19/2018 2058   BILIRUBINUR NEGATIVE 01/19/2018 2058   KETONESUR NEGATIVE 01/19/2018 2058   PROTEINUR NEGATIVE 01/19/2018 2058   NITRITE NEGATIVE 01/19/2018 2058   LEUKOCYTESUR NEGATIVE 01/19/2018 1610     Darcelle Herrada M.D. [facility] 01/27/2018, 11:21 AM  Pager: [phone] Between  7am to 7pm - call Pager - [phone]  After 7pm go to [email or web address] - password [email or web address]  Call night coverage person covering after 7pm

## 2018-01-27 NOTE — Progress Notes (Addendum)
Arrived from [facility] in stable condition. Telephone report received prior to arrival. Oriented to unit and staff; understanding verbalized. Orders reviewed. Will continue to monitor.

## 2018-01-27 NOTE — Progress Notes (Addendum)
Physical Therapy Treatment Patient Details Name: Debbie Golden MRN: [id number] DOB: 11/09/1971 Today's Date: 01/27/2018    History of Present Illness 47 y.o.femalewith medical history significant forbipolar disorder with history of catatonia in 2017, obesity, macrocytic anemia, history of genital herpes, and regular alcohol use presenting with reports of pain and numbness in both legs for hte past week. MRI of cervical and thoracic spine did not reveal explanation for pts symptoms. MRI lumbar spine pending. Review of CSF not consistent with Guillian-Barr. Pt started on IVIG on 1/23.    PT Comments    Pt performed tilt in Vital Go bed x 7 min with chest strap loosened in standing to challenge upper trunk.  Pt performed modified mini squat in partial tilt to improve ease and strengthen quad to prepare for transfers in the future.  Plan for SNF remains appropriate.  Informed nursing to perform 2 additional tilts this pm.    Follow Up Recommendations  SNF;Supervision/Assistance - 24 hour     Equipment Recommendations  None recommended by PT(TBA at next venue)    Recommendations for Other Services       Precautions / Restrictions Precautions Precautions: Fall Precaution Comments: knees buckle, pt very anxious with mobility Restrictions Weight Bearing Restrictions: No    Mobility  Bed Mobility Overal bed mobility: Needs Assistance             General bed mobility comments: For scoot in supine only to adjust and place straps to prepare for tilt therapy.  Max +2 with use of bed pad.    Transfers Overall transfer level: Needs assistance Equipment used: 2 person hand held assist(Once in standing to improve anxiety.  ) Transfers: (Pt titled into standing with total assistance of tilt bed. )           General transfer comment: Pt positioned and safety straps placed for tilt into standing.  Pt performed standing reaching with chest strap loosened to reach outside BOS.Pt with  reduced standing tolerance due to fatigue around 7 min during session.    Ambulation/Gait Ambulation/Gait assistance: (Not safe until strength and coordination improve in B LEs.  )               Stairs            Wheelchair Mobility    Modified Rankin (Stroke Patients Only)       Balance Overall balance assessment: Needs assistance           Standing balance-Leahy Scale: Poor Standing balance comment: required use of tilt frame and straps for support but able to loosen chest strap to perform reaching in standing outside BOS.                             Cognition Arousal/Alertness: Awake/alert Behavior During Therapy: Anxious Overall Cognitive Status: Within Functional Limits for tasks assessed(but remains to present with anxiety.  )                                        Exercises General Exercises - Lower Extremity Mini-Sqauts: AROM;Both;10 reps;Supine(tilted in vital go bed.  )    General Comments        Pertinent Vitals/Pain Pain Assessment: Faces Faces Pain Scale: Hurts little more Pain Location: L hand and arm Pain Descriptors / Indicators: Sore Pain Intervention(s): Monitored during session;Repositioned  Home Living                      Prior Function            PT Goals (current goals can now be found in the care plan section) Acute Rehab PT Goals Patient Stated Goal: to get better Potential to Achieve Goals: Good Progress towards PT goals: Progressing toward goals    Frequency    Min 3X/week      PT Plan Current plan remains appropriate    Co-evaluation              AM-PAC PT "6 Clicks" Daily Activity  Outcome Measure  Difficulty turning over in bed (including adjusting bedclothes, sheets and blankets)?: Unable Difficulty moving from lying on back to sitting on the side of the bed? : Unable Difficulty sitting down on and standing up from a chair with arms (e.g., wheelchair,  bedside commode, etc,.)?: Unable Help needed moving to and from a bed to chair (including a wheelchair)?: Total Help needed walking in hospital room?: Total Help needed climbing 3-5 steps with a railing? : Total 6 Click Score: 6    End of Session Equipment Utilized During Treatment: Gait belt Activity Tolerance: Other (comment);Patient tolerated treatment well(anxiety remains.  ) Patient left: in bed;with call bell/phone within reach;with nursing/sitter in room Nurse Communication: Mobility status(Educated Nurse and nurse tech to perform 2 additional tilts this pm.  ) PT Visit Diagnosis: Unsteadiness on feet (R26.81);Other abnormalities of gait and mobility (R26.89);Muscle weakness (generalized) (M62.81);Other symptoms and signs involving the nervous system (R29.898)     Time: [id number] PT Time Calculation (min) (ACUTE ONLY): 31 min  Charges:  $Therapeutic Activity: 23-37 mins                    G Codes:       Joycelyn RuaAimee Belita Warsame, PTA pager [phone]    Florestine AversAimee J Kimberlye Dilger 01/27/2018, 2:14 PM

## 2018-01-27 NOTE — Progress Notes (Signed)
RT NOTE:  NIF: -34 VC: 1.8L  Best of three. Good patient effort.

## 2018-01-28 MED ORDER — FOLIC ACID 1 MG PO TABS: 1.0000 mg | ORAL_TABLET | Freq: Every day | ORAL | 3 refills | Status: DC

## 2018-01-28 MED ORDER — THIAMINE HCL 100 MG PO TABS: 100.0000 mg | ORAL_TABLET | Freq: Every day | ORAL | 1 refills | Status: DC

## 2018-01-28 MED ORDER — CHLORDIAZEPOXIDE HCL 5 MG PO CAPS: ORAL_CAPSULE | ORAL | 0 refills | Status: DC

## 2018-01-28 MED ORDER — ACETAMINOPHEN 325 MG PO TABS: 650.0000 mg | ORAL_TABLET | Freq: Four times a day (QID) | ORAL | Status: DC | PRN

## 2018-01-28 MED ORDER — ADULT MULTIVITAMIN W/MINERALS CH: 1.0000 | ORAL_TABLET | Freq: Every day | ORAL | Status: DC

## 2018-01-28 MED ORDER — RISPERIDONE 0.5 MG PO TABS: 0.5000 mg | ORAL_TABLET | Freq: Two times a day (BID) | ORAL | 0 refills | Status: DC | PRN

## 2018-01-28 MED ORDER — CHLORDIAZEPOXIDE HCL 5 MG PO CAPS
5.0000 mg | ORAL_CAPSULE | Freq: Two times a day (BID) | ORAL | Status: DC
  Administered 2018-01-28 – 2018-01-30 (×5): 5 mg via ORAL
  Filled 2018-01-28 (×5): qty 1

## 2018-01-28 MED ORDER — VITAMIN B-6 100 MG PO TABS: 100.0000 mg | ORAL_TABLET | Freq: Every day | ORAL | 1 refills | Status: DC

## 2018-01-28 MED ORDER — RISPERIDONE 2 MG PO TABS: 2.0000 mg | ORAL_TABLET | Freq: Every day | ORAL | 0 refills | Status: DC

## 2018-01-28 NOTE — Progress Notes (Signed)
NIF of >-40 performed with good effort x3 times.

## 2018-01-28 NOTE — Care Management Note (Signed)
Case Management Note Donn PieriniKristi Davyn Elsasser RN, BSN Unit 4E-Case Manager-- 2H coverage [phone]  Patient Details  Name: Debbie PatienceHolly Golden MRN: [id number] Date of Birth: [birth year]  Subjective/Objective:   Pt admitted with AMS and weakness                 Action/Plan: PTA Pt lived at home with boyfriend- CM to follow for transition of care needs- per PTeval - recommendation for SNF- CSW to f/u regarding possible placement needs- psych has also been consulted.   Expected Discharge Date:  01/28/18               Expected Discharge Plan:  Skilled Nursing Facility  In-House Referral:  Clinical Social Work  Discharge planning Services  CM Consult  Post Acute Care Choice:  NA Choice offered to:  NA  DME Arranged:    DME Agency:     HH Arranged:    HH Agency:     Status of Service:  Completed, signed off  If discussed at [employer] Length of Stay Meetings, dates discussed:    Discharge Disposition: skilled facility   Additional Comments:  01/28/18- 1045- Saranya Harlin RN, CM- pt stable for discharge- CSW following for SNF placement  Darrold SpanWebster, Idora Brosious Hall, RN 01/28/2018, 10:55 AM

## 2018-01-28 NOTE — Progress Notes (Signed)
NIF of greater than -40 with good effort on three attempts. B/S clear with no c/o being short of breath.

## 2018-01-28 NOTE — NC FL2 (Signed)
[place] MEDICAID FL2 LEVEL OF CARE SCREENING TOOL     IDENTIFICATION  Patient Name: Debbie Golden Birthdate: 06-21-1971 Sex: female Admission Date (Current Location): 01/20/2018  [facility] [place]   Facility and Address:  [facility] [place], [address], [place], [place] [address]      Provider Number: [id number]  Attending Physician Name and Address:  Cathren Harshai, Ripudeep K, MD  Relative Name and Phone Number:  [name], significant other, [phone]    Current Level of Care: Hospital Recommended Level of Care: Skilled Nursing Facility Prior Approval Number:    Date Approved/Denied:   PASRR Number: [id number] [id number]  Discharge Plan: SNF    Current Diagnoses: Patient Active Problem List   Diagnosis Date Noted  . Acute encephalopathy   . Alcohol withdrawal syndrome, with delirium (HCC)   . Altered mental status   . Leg weakness, bilateral 01/20/2018  . Obesity 01/20/2018  . Bipolar disorder (HCC) 01/20/2018  . Macrocytic anemia 01/20/2018  . Acute hypokalemia 01/20/2018  . Alcohol use 01/20/2018  . Guillain Barr syndrome (HCC)   . Catatonia associated with another mental disorder 11/10/2016  . Noncompliance 11/10/2016  . Involuntary commitment 11/10/2016  . GERD (gastroesophageal reflux disease) 06/25/2015  . Bipolar disorder, curr episode mixed, severe, with psychotic features (HCC) 06/22/2015  . Bipolar disorder (manic depression) (HCC) 06/21/2015  . Hypokalemia 06/21/2015  . Delirium, acute 06/19/2015  . Major depression 06/19/2015  . Alcohol abuse 06/19/2015    Orientation RESPIRATION BLADDER Height & Weight     Self, Place  Normal Continent Weight: 224 lb (101.6 kg) Height:  5\' 7"  (170.2 cm)  BEHAVIORAL SYMPTOMS/MOOD NEUROLOGICAL BOWEL NUTRITION STATUS      Continent Diet(please see DC summary)  AMBULATORY STATUS COMMUNICATION OF NEEDS Skin   Extensive Assist Verbally Normal                       Personal  Care Assistance Level of Assistance  Bathing, Feeding, Dressing Bathing Assistance: Maximum assistance Feeding assistance: Maximum assistance Dressing Assistance: Maximum assistance     Functional Limitations Info  Sight, Hearing, Speech Sight Info: Adequate Hearing Info: Adequate Speech Info: Adequate    SPECIAL CARE FACTORS FREQUENCY  PT (By licensed PT), OT (By licensed OT)     PT Frequency: 5x/week OT Frequency: 5x/week            Contractures Contractures Info: Not present    Additional Factors Info  Code Status, Allergies, Psychotropic Code Status Info: Full Allergies Info: No Known Allergies Psychotropic Info: risperdal         Current Medications (01/28/2018):  This is the current hospital active medication list Current Facility-Administered Medications  Medication Dose Route Frequency Provider Last Rate Last Dose  . acetaminophen (TYLENOL) tablet 650 mg  650 mg Oral Q6H PRN Russella DarEllis, Allison L, NP   650 mg at 01/27/18 2005   Or  . acetaminophen (TYLENOL) suppository 650 mg  650 mg Rectal Q6H PRN Russella DarEllis, Allison L, NP      . aspirin EC tablet 325 mg  325 mg Oral Daily Russella DarEllis, Allison L, NP   325 mg at 01/27/18 1034  . chlordiazePOXIDE (LIBRIUM) capsule 5 mg  5 mg Oral BID Rai, Ripudeep K, MD      . docusate sodium (COLACE) capsule 100 mg  100 mg Oral Daily PRN Edsel PetrinMikhail, Maryann, DO   100 mg at 01/25/18 [id number]  . enoxaparin (LOVENOX) injection 40 mg  40  mg Subcutaneous Q24H Russella Dar, NP   40 mg at 01/27/18 2134  . folic acid (FOLVITE) tablet 1 mg  1 mg Oral Daily Russella Dar, NP   1 mg at 01/27/18 1034  . lamoTRIgine (LAMICTAL) tablet 100 mg  100 mg Oral Daily Junious Silk L, NP   100 mg at 01/27/18 1034  . LORazepam (ATIVAN) injection 2-3 mg  2-3 mg Intravenous Q1H PRN Edsel Petrin, DO   3 mg at 01/25/18 1610  . multivitamin with minerals tablet 1 tablet  1 tablet Oral Daily Edsel Petrin, DO   1 tablet at 01/27/18 1034  . ondansetron (ZOFRAN)  tablet 4 mg  4 mg Oral Q6H PRN Russella Dar, NP       Or  . ondansetron [facility]) injection 4 mg  4 mg Intravenous Q6H PRN Russella Dar, NP      . pyridOXINE (B-6) injection 100 mg  100 mg Intravenous Daily Mikhail, Nita Sells, DO   100 mg at 01/27/18 1034  . risperiDONE (RISPERDAL) tablet 0.5 mg  0.5 mg Oral BID PRN Edsel Petrin, DO      . risperiDONE (RISPERDAL) tablet 2 mg  2 mg Oral QHS Mikhail, [place], DO   2 mg at 01/27/18 2135  . thiamine (VITAMIN B-1) tablet 100 mg  100 mg Oral Daily Russella Dar, NP   100 mg at 01/27/18 1034     Discharge Medications: Please see discharge summary for a list of discharge medications.  Relevant Imaging Results:  Relevant Lab Results:   Additional Information SSN: [id number]  Althea Charon, LCSW

## 2018-01-28 NOTE — Progress Notes (Signed)
[facility]                                                                              Patient Demographics  Debbie Golden, is a 47 y.o. female, DOB - April 10, 1971, WUJ:[id number]  Admit date - 01/20/2018   Admitting Physician Pieter PartridgeSrobona Tublu Chatterjee, MD  Outpatient Primary MD for the patient is Patient, No Pcp Per  Outpatient specialists:   LOS - 8  days   Medical records reviewed and are as summarized below:    Chief Complaint  Patient presents with  . Knee Pain  . Weakness       Brief summary   HPI on 01/20/2018 by Ms. Junious SilkAllison Ellis, NP Phoebe PerchHolly Goldis a 47 y.o.femalewith medical history significant forbipolar disorder with history of catatonia in 2017, obesity, macrocytic anemia, history of genital herpes, and regular alcohol use. [facility] reporting pain and numbness in both legs greater in the left leg ongoing for a week. She reports chronic back problems and had a steroid injection 1 week prior. She reported increased difficulty with ambulation at home and no recent trauma. When I questioned the patient she reports she has been having difficulty ambulating and weakness/numbness/tingling in the legs waxing and waning for 1 month. CT of the head was unremarkable. Due to concerns of possible Guillain-Barr patient was subsequently transferred to this facility so she can undergo a lumbar puncture. [facility] by neurology who documented on exam patient had absent generalized DTRs, mild bilateral lower extremity motor weakness averaging 4/5 from the hips down with decreased wrist flexion, extension, abduction bilaterally at 4/5 as well. She was not exhibiting any respiratory symptoms or signs of bowel or bladder incontinence or urinary retention. MRI of the cervical and thoracic spine did not reveal explanation for patient's current symptoms. Subsequent review of CSF is not consistent with  Guillian-Barr.Patient was admitted for further workup    Assessment & Plan    Principal Problem: Acute metabolic encephalopathy with underlying alcohol use, questionable withdrawal -Currently alert and oriented x3,  - patient was placed on CIWA call with Ativan however agitation appears to have worsened with Ativan administration. P CCM was consulted and patient was transferred to ICU for Precedex administration -MRI brain showed no acute intracranial pathology, mental status has been improving - psychiatry was also consulted and recommended Risperdal 0.5 mg BID PRN for hallucinations -Librium tapered to 5 mg twice a day, further taper likely outpatient   Active Problems:   Leg weakness, bilateral -Neurology was consulted initially suspecting possible Guillain-Barr syndrome. -MRI cervical, thoracic spine normal.  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, no associated spinal stenosis, small lumbar disc herniation at L1-2 without convincing neural impinge -Patient underwent spinal tap, no pleocytosis, protein was not elevated, neurology recommended 5 days IVIG, finished 1/29 -Currently awaiting skilled nursing facility bed    Obesity, history of gastric bypass -BMI 35.0 -Counseled on diet and weight control    Bipolar disorder (HCC) -  Continue Lamictal, Risperdal    Macrocytic anemia -Possibly due to alcohol use, AST > ALT and folate deficiency -B12 401, folate low 2.4, placed on folic acid daily -Vitamin B6 level low, 1.8, currently on IV pyridoxine, transition to oral pyridoxine 100 mg daily at discharge   Abnormal TSH TSH 4.7, free T4 1.15, recheck thyroid function outpatient in 4-6 weeks  Code Status: Full code DVT Prophylaxis:  Lovenox Family Communication: Discussed in detail with the patient, all imaging results, lab results  explained to the patient    Disposition Plan: Possible DC today if skilled nursing facility available  Time Spent in minutes   35 minutes  Procedures:    Consultants:   Neurology Psychiatry  Antimicrobials:      Medications  Scheduled Meds: . aspirin EC  325 mg Oral Daily  . chlordiazePOXIDE  5 mg Oral BID  . enoxaparin (LOVENOX) injection  40 mg Subcutaneous Q24H  . folic acid  1 mg Oral Daily  . lamoTRIgine  100 mg Oral Daily  . multivitamin with minerals  1 tablet Oral Daily  . pyridOXINE  100 mg Intravenous Daily  . risperiDONE  2 mg Oral QHS  . thiamine  100 mg Oral Daily   Continuous Infusions: PRN Meds:.acetaminophen **OR** acetaminophen, docusate sodium, LORazepam, ondansetron **OR** ondansetron (ZOFRAN) IV, risperiDONE   Antibiotics   Anti-infectives (From admission, onward)   None        Subjective:   Debbie Patience was seen and examined today.  Feels a lot better, bilateral lower extremity weakness improving slowly, feels ready to start rehab.  Patient denies dizziness, chest pain, shortness of breath, abdominal pain, N/V/D/C. No acute events overnight.    Objective:   Vitals:   01/27/18 1243 01/27/18 1957 01/28/18 0443 01/28/18 1000  BP: 98/66 104/81 123/78 133/84  Pulse: (!) 121 (!) 110 (!) 103 (!) 103  Resp: (!) 27 19 (!) 21 14  Temp: 98.3 F (36.8 C) 98.8 F (37.1 C) 98.4 F (36.9 C) 98.8 F (37.1 C)  TempSrc: Oral Oral Oral Oral  SpO2:  100% 99% 96%  Weight:      Height:        Intake/Output Summary (Last 24 hours) at 01/28/2018 1200 Last data filed at 01/28/2018 0900 Gross per 24 hour  Intake 942 ml  Output 490 ml  Net 452 ml     Wt Readings from Last 3 Encounters:  01/27/18 101.6 kg (224 lb)  01/19/18 101.6 kg (224 lb)  11/09/16 83 kg (183 lb)     Exam   General: Alert and oriented x 3, NAD  Eyes:   HEENT:  Atraumatic, normocephalic  Cardiovascular: S1 S2 auscultated, Regular rate and rhythm. No pedal edema  b/l  Respiratory: Clear to auscultation bilaterally, no wheezing, rales or rhonchi  Gastrointestinal: Soft, nontender, nondistended, + bowel sounds  Ext: no pedal edema bilaterally  Neuro: no new deficits  Musculoskeletal: No digital cyanosis, clubbing  Skin: No rashes  Psych: Normal affect and demeanor, alert and oriented x3    Data Reviewed:  I have personally reviewed following labs and imaging studies  Micro Results Recent Results (from the past 240 hour(s))  CSF culture     Status: None   Collection Time: 01/20/18  3:55 AM  Result Value Ref Range Status   Specimen Description CSF  Final   Special Requests NONE  Final   Gram Stain   Final    WBC PRESENT, PREDOMINANTLY MONONUCLEAR NO ORGANISMS SEEN CYTOSPIN SMEAR  Culture NO GROWTH 3 DAYS  Final   Report Status 01/23/2018 FINAL  Final  MRSA PCR Screening     Status: None   Collection Time: 01/20/18 11:33 AM  Result Value Ref Range Status   MRSA by PCR NEGATIVE NEGATIVE Final    Comment:        The GeneXpert MRSA Assay (FDA approved for NASAL specimens only), is one component of a comprehensive MRSA colonization surveillance program. It is not intended to diagnose MRSA infection nor to guide or monitor treatment for MRSA infections.     Radiology Reports Ct Head Wo Contrast  Result Date: 01/19/2018 CLINICAL DATA:  Pain and numbness in the legs. Symptoms have been going on for a week. EXAM: CT HEAD WITHOUT CONTRAST TECHNIQUE: Contiguous axial images were obtained from the base of the skull through the vertex without intravenous contrast. COMPARISON:  11/09/2016 FINDINGS: Brain: No evidence of acute infarction, hemorrhage, hydrocephalus, extra-axial collection or mass lesion/mass effect. Vascular: No hyperdense vessel or unexpected calcification. Skull: Normal. Negative for fracture or focal lesion. Sinuses/Orbits: Mucosal disease in the right sphenoid sinus. Other: None. IMPRESSION: No acute intracranial  abnormality. Electronically Signed   By: Richarda Overlie M.D.   On: 01/19/2018 21:54   Mr Brain Wo Contrast  Result Date: 01/23/2018 CLINICAL DATA:  Lower extremity weakness.  No loosening a shins. EXAM: MRI HEAD WITHOUT CONTRAST TECHNIQUE: Multiplanar, multiecho pulse sequences of the brain and surrounding structures were obtained without intravenous contrast. COMPARISON:  Head CT 01/19/2018 FINDINGS: Brain: Brain has normal appearance without evidence of malformation, atrophy, old or acute small or large vessel infarction, mass lesion, hemorrhage, hydrocephalus or extra-axial collection. Vascular: Major vessels at the base of the brain show flow. Venous sinuses appear patent. Skull and upper cervical spine: Normal. Sinuses/Orbits: Mild seasonal mucosal thickening.  Orbits negative. Other: None significant. IMPRESSION: Normal MRI of the brain. Electronically Signed   By: Paulina Fusi M.D.   On: 01/23/2018 15:05   Mr Cervical Spine Wo Contrast  Result Date: 01/20/2018 CLINICAL DATA:  Initial evaluation for progressive ascending weakness and numbness. EXAM: MRI CERVICAL, THORACIC SPINE WITHOUT CONTRAST TECHNIQUE: Multiplanar and multiecho pulse sequences of the cervical spine, to include the craniocervical junction and cervicothoracic junction, and thoracic and lumbar spine, were obtained without intravenous contrast. COMPARISON:  None available. FINDINGS: MRI CERVICAL SPINE FINDINGS Alignment: Study moderately degraded by motion artifact. Straightening with slight reversal of the normal cervical lordosis, apex at C5. No listhesis. Vertebrae: Vertebral body heights are maintained without evidence for acute or chronic fracture. Bone marrow signal intensity within normal limits. No discrete or worrisome osseous lesions. No abnormal marrow edema. Cord: Signal intensity within cervical spinal cord within normal limits. No appreciable cord signal abnormality identified on this motion degraded exam. Posterior Fossa,  vertebral arteries, paraspinal tissues: Visualized brain within normal limits. Craniocervical junction normal. Scattered mucosal thickening noted with in the visualized paranasal sinuses. Paraspinous and prevertebral soft tissues within normal limits. Normal intravascular flow voids present within the vertebral arteries bilaterally. Disc levels: C2-C3: Unremarkable. C3-C4:  Unremarkable. C4-C5: Mild diffuse disc bulge with bilateral uncovertebral hypertrophy. Resultant mild right C5 foraminal stenosis. No significant canal narrowing. C5-C6: Chronic diffuse degenerative disc osteophyte with intervertebral disc space narrowing. Broad posterior component mildly flattens the ventral thecal sac without significant spinal stenosis. Moderate bilateral C6 foraminal stenosis, slightly worse on the right. C6-C7: Mild diffuse chronic disc osteophyte with intervertebral disc space narrowing. Flattening of the ventral thecal sac without significant spinal stenosis. Mild bilateral  C7 foraminal stenosis. C7-T1: Mild facet hypertrophy. Otherwise unremarkable without stenosis. MRI THORACIC SPINE FINDINGS Alignment: Vertebral bodies normally aligned with preservation of the normal thoracic kyphosis. No listhesis. Vertebrae: Vertebral body heights are maintained without evidence for acute or chronic fracture. Bone marrow signal intensity within normal limits. No discrete or worrisome osseous lesions. No abnormal marrow edema. Cord: Signal intensity within the thoracic spinal cord within normal limits. No cord signal abnormality seen on this motion degraded study. Conus medullaris terminates at the L1 level. Overall cord caliber normal. Paraspinal and other soft tissues: Paraspinous soft tissues within normal limits. Partially visualized lungs are grossly clear. Visualized visceral structures are unremarkable. Disc levels: T7-8: Mild disc bulge without significant stenosis. L1-2: Seen only on sagittal projection. Disc bulge without  significant canal stenosis. No foraminal encroachment. No other significant degenerative changes seen within the thoracic spine. No significant canal or neural foraminal stenosis. IMPRESSION: MRI CERVICAL SPINE IMPRESSION: 1. Normal MRI appearance of the cervical spinal cord. 2. 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 Mild to moderate bilateral foraminal narrowing at these levels as above. MRI THORACIC SPINE IMPRESSION: 1. Normal MRI appearance of the thoracic spinal cord. 2. Minimal disc bulging at T7-8 without stenosis. No other significant degenerative changes within the thoracic spine. No significant stenosis. Electronically Signed   By: Rise Mu M.D.   On: 01/20/2018 06:42   Mr Thoracic Spine Wo Contrast  Result Date: 01/20/2018 CLINICAL DATA:  Initial evaluation for progressive ascending weakness and numbness. EXAM: MRI CERVICAL, THORACIC SPINE WITHOUT CONTRAST TECHNIQUE: Multiplanar and multiecho pulse sequences of the cervical spine, to include the craniocervical junction and cervicothoracic junction, and thoracic and lumbar spine, were obtained without intravenous contrast. COMPARISON:  None available. FINDINGS: MRI CERVICAL SPINE FINDINGS Alignment: Study moderately degraded by motion artifact. Straightening with slight reversal of the normal cervical lordosis, apex at C5. No listhesis. Vertebrae: Vertebral body heights are maintained without evidence for acute or chronic fracture. Bone marrow signal intensity within normal limits. No discrete or worrisome osseous lesions. No abnormal marrow edema. Cord: Signal intensity within cervical spinal cord within normal limits. No appreciable cord signal abnormality identified on this motion degraded exam. Posterior Fossa, vertebral arteries, paraspinal tissues: Visualized brain within normal limits. Craniocervical junction normal. Scattered mucosal thickening noted with in the visualized paranasal  sinuses. Paraspinous and prevertebral soft tissues within normal limits. Normal intravascular flow voids present within the vertebral arteries bilaterally. Disc levels: C2-C3: Unremarkable. C3-C4:  Unremarkable. C4-C5: Mild diffuse disc bulge with bilateral uncovertebral hypertrophy. Resultant mild right C5 foraminal stenosis. No significant canal narrowing. C5-C6: Chronic diffuse degenerative disc osteophyte with intervertebral disc space narrowing. Broad posterior component mildly flattens the ventral thecal sac without significant spinal stenosis. Moderate bilateral C6 foraminal stenosis, slightly worse on the right. C6-C7: Mild diffuse chronic disc osteophyte with intervertebral disc space narrowing. Flattening of the ventral thecal sac without significant spinal stenosis. Mild bilateral C7 foraminal stenosis. C7-T1: Mild facet hypertrophy. Otherwise unremarkable without stenosis. MRI THORACIC SPINE FINDINGS Alignment: Vertebral bodies normally aligned with preservation of the normal thoracic kyphosis. No listhesis. Vertebrae: Vertebral body heights are maintained without evidence for acute or chronic fracture. Bone marrow signal intensity within normal limits. No discrete or worrisome osseous lesions. No abnormal marrow edema. Cord: Signal intensity within the thoracic spinal cord within normal limits. No cord signal abnormality seen on this motion degraded study. Conus medullaris terminates at the L1 level. Overall cord caliber normal. Paraspinal and other soft tissues: Paraspinous  soft tissues within normal limits. Partially visualized lungs are grossly clear. Visualized visceral structures are unremarkable. Disc levels: T7-8: Mild disc bulge without significant stenosis. L1-2: Seen only on sagittal projection. Disc bulge without significant canal stenosis. No foraminal encroachment. No other significant degenerative changes seen within the thoracic spine. No significant canal or neural foraminal stenosis.  IMPRESSION: MRI CERVICAL SPINE IMPRESSION: 1. Normal MRI appearance of the cervical spinal cord. 2. 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 Mild to moderate bilateral foraminal narrowing at these levels as above. MRI THORACIC SPINE IMPRESSION: 1. Normal MRI appearance of the thoracic spinal cord. 2. Minimal disc bulging at T7-8 without stenosis. No other significant degenerative changes within the thoracic spine. No significant stenosis. Electronically Signed   By: Rise Mu M.D.   On: 01/20/2018 06:42   Mr Lumbar Spine W Wo Contrast  Result Date: 01/20/2018 CLINICAL DATA:  47 year old female with progressive ascending weakness and numbness. EXAM: MRI LUMBAR SPINE WITHOUT AND WITH CONTRAST TECHNIQUE: Multiplanar and multiecho pulse sequences of the lumbar spine were obtained without and with intravenous contrast. CONTRAST:  20mL MULTIHANCE GADOBENATE DIMEGLUMINE 529 MG/ML IV SOLN COMPARISON:  Cervical and thoracic spine MRI 0534 hr today. Lumbar puncture images 0 347 hr today. FINDINGS: Segmentation: Normal lumbar segmentation demonstrated on the LP images. This numbering appears concordant with the cervical and thoracic spine numbering earlier today. Alignment:  Preserved lumbar lordosis. Vertebrae: Chronic degenerative endplate marrow signal changes at L5-S1. Elsewhere Visualized bone marrow signal is within normal limits. No marrow edema or evidence of acute osseous abnormality. Intact visible sacrum and SI joints. Conus medullaris and cauda equina: Conus extends to the T12-L1 level. Normal conus. No abnormal intradural enhancement. No thickening of the cauda equina, nerve roots appear normal. Paraspinal and other soft tissues: Negative visualized abdominal viscera. Posterior paraspinal soft tissues are within normal limits; there is a degree of diffuse paraspinal muscle atrophy. Disc levels: T12-L1:  Negative. L1-L2: Small central to slightly left  paracentral disc herniation (series 6, image 10). No associated stenosis. L2-L3:  Mild facet hypertrophy.  No stenosis. L3-L4:  Mild facet hypertrophy.  No stenosis. L4-L5:  Mild facet hypertrophy.  No stenosis. L5-S1: Advanced disc space loss. Probable vacuum disc. Circumferential disc osteophyte complex with broad-based posterior component. Mild facet hypertrophy. Mild epidural lipomatosis. No significant spinal stenosis or convincing lateral recess stenosis. There is moderate left and mild right L5 neural foraminal stenosis. IMPRESSION: 1. Normal lower thoracic spinal cord and cauda equina. No evidence of intradural inflammation. 2. Advanced chronic lumbar disc degeneration at L5-S1. No associated spinal stenosis, but mild to moderate L5 neural foraminal stenosis greater on the left. 3. Small lumbar disc herniation at L1-L2 without convincing neural impingement. 4.  No acute osseous abnormality. Electronically Signed   By: Odessa Fleming M.D.   On: 01/20/2018 11:46   Dg Fluoro Guide Lumbar Puncture  Result Date: 01/20/2018 CLINICAL DATA:  Numbness in the legs and arms. EXAM: DIAGNOSTIC LUMBAR PUNCTURE UNDER FLUOROSCOPIC GUIDANCE FLUOROSCOPY TIME:  Fluoroscopy Time:  1 minute 0 seconds. Radiation Exposure Index (if provided by the fluoroscopic device): Dose area product is 270 micro Gy meter square. Reference air kerma is 21 mGy Number of Acquired Spot Images: 1 PROCEDURE: Informed consent was obtained from the patient prior to the procedure, including potential complications of headache, allergy, and pain. With the patient prone, the lower back was prepped with Betadine. 1% Lidocaine was used for local anesthesia. Lumbar puncture was performed at the  L4-5 level using a 20 gauge needle with return of clear CSF. 5 ml of CSF were obtained for laboratory studies. During collection for the third tube, the patient moved, dislodging the needle and the procedure was ended. The last of the fluid became blood-tinged after the  needle was dislodged. Opening pressure was not obtained as the patient was unable to tolerate positioning for opening pressure. Subjectively, the pressure was not elevated. The patient tolerated the procedure well and there were no apparent complications. Patient complained of loss of feeling in her legs both before and after the procedure without significant change. IMPRESSION: Lumbar puncture performed under fluoroscopy at the L4-5 level. 5 mL clear CSF was obtained. Patient tolerated the procedure well without complication. Electronically Signed   By: Burman Nieves M.D.   On: 01/20/2018 04:21    Lab Data:  CBC: Recent Labs  Lab 01/22/18 0502 01/23/18 0351 01/25/18 0244 01/26/18 0233 01/27/18 0617  WBC 7.2 6.5 5.1 4.5 5.9  HGB 8.0* 8.1* 8.3* 8.5* 8.5*  HCT 24.3* 25.4* 25.5* 25.8* 25.6*  MCV 124.0* 125.7* 124.4* 124.6* 123.7*  PLT 251 251 287 308 391   Basic Metabolic Panel: Recent Labs  Lab 01/22/18 0502 01/23/18 0351 01/25/18 0244 01/26/18 0233 01/27/18 0617  NA 138 140 133* 136 136  K 3.6 4.2 3.7 3.3* 3.7  CL 110 113* 104 108 108  CO2 19* 20* 20* 22 22  GLUCOSE 97 118* 88 95 88  BUN 5* <5* <5* 5* 6  CREATININE 0.65 0.63 0.68 0.65 0.59  CALCIUM 7.8* 8.3* 8.3* 8.0* 8.2*  MG 1.8  --  1.9  --  2.1  PHOS 2.4*  --  3.5  --  2.8   GFR: Estimated Creatinine Clearance: 106.5 mL/min (by C-G formula based on SCr of 0.59 mg/dL). Liver Function Tests: No results for input(s): AST, ALT, ALKPHOS, BILITOT, PROT, ALBUMIN in the last 168 hours. No results for input(s): LIPASE, AMYLASE in the last 168 hours. No results for input(s): AMMONIA in the last 168 hours. Coagulation Profile: No results for input(s): INR, PROTIME in the last 168 hours. Cardiac Enzymes: Recent Labs  Lab 01/22/18 1718  CKTOTAL 62   BNP (last 3 results) No results for input(s): PROBNP in the last 8760 hours. HbA1C: No results for input(s): HGBA1C in the last 72 hours. CBG: Recent Labs  Lab  01/22/18 1534  GLUCAP 114*   Lipid Profile: No results for input(s): CHOL, HDL, LDLCALC, TRIG, CHOLHDL, LDLDIRECT in the last 72 hours. Thyroid Function Tests: No results for input(s): TSH, T4TOTAL, FREET4, T3FREE, THYROIDAB in the last 72 hours. Anemia Panel: No results for input(s): VITAMINB12, FOLATE, FERRITIN, TIBC, IRON, RETICCTPCT in the last 72 hours. Urine analysis:    Component Value Date/Time   COLORURINE AMBER (A) 01/19/2018 2058   APPEARANCEUR HAZY (A) 01/19/2018 2058   LABSPEC 1.013 01/19/2018 2058   PHURINE 6.0 01/19/2018 2058   GLUCOSEU NEGATIVE 01/19/2018 2058   HGBUR NEGATIVE 01/19/2018 2058   BILIRUBINUR NEGATIVE 01/19/2018 2058   KETONESUR NEGATIVE 01/19/2018 2058   PROTEINUR NEGATIVE 01/19/2018 2058   NITRITE NEGATIVE 01/19/2018 2058   LEUKOCYTESUR NEGATIVE 01/19/2018 1610     Ripudeep Rai M.D. [facility] 01/28/2018, 12:00 PM  Pager: [phone] Between 7am to 7pm - call Pager - [phone]  After 7pm go to [email or web address] - password [email or web address]  Call night coverage person covering after 7pm

## 2018-01-29 NOTE — Progress Notes (Signed)
Occupational Therapy Treatment Patient Details Name: Nerea Bordenave MRN: [id number] DOB: Oct 28, 1971 Today's Date: 01/29/2018    History of present illness 47 y.o.femalewith medical history significant forbipolar disorder with history of catatonia in 2017, obesity, macrocytic anemia, history of genital herpes, and regular alcohol use presenting with reports of pain and numbness in both legs for hte past week. MRI of cervical and thoracic spine did not reveal explanation for pts symptoms. MRI lumbar spine pending. Review of CSF not consistent with Guillian-Barr. Pt started on IVIG on 1/23.   OT comments  Pt making good progress towards OT goals. Hands present with improved functioning and ability to perform oral care with built up handles utilizing the tilt bed to full upright. L remains weaker than right. Pt continues to have extremely anxious behavior during session - used visualization and deep breathing techniques. Pt discharge plan remains appropriate, and OT will continue to follow acutely while admitted.   Follow Up Recommendations  SNF;Supervision/Assistance - 24 hour    Equipment Recommendations  Other (comment)(defer to next venue)    Recommendations for Other Services      Precautions / Restrictions Precautions Precautions: Fall Precaution Comments: knees buckle, pt very anxious with mobility Restrictions Weight Bearing Restrictions: No       Mobility Bed Mobility Overal bed mobility: Needs Assistance             General bed mobility comments: For scoot in supine only to adjust and place straps to prepare for tilt therapy.  Max +2 with use of bed pad.    Transfers Overall transfer level: Needs assistance Equipment used: 2 person hand held assist(once in standing to improve anxiety, face to face hand held ) Transfers: (tilted to standing with vital go tilt bed)           General transfer comment: Pt positioned and safety straps placed for tilt into standing.  Pt  performed standing reaching and grooming tasks (ADL) with chest strap loosened to reach outside BOS.Pt tolerated around 12 min stand time with chest strap loosened and lower shin strap loosened.       Balance Overall balance assessment: Needs assistance Sitting-balance support: Feet supported;Bilateral upper extremity supported Sitting balance-Leahy Scale: Fair(in chair position; tilt bed)     Standing balance support: Bilateral upper extremity supported Standing balance-Leahy Scale: Poor Standing balance comment: required use of tilt frame and straps for support but able to loosen chest strap to perform reaching in standing outside BOS.                            ADL either performed or assessed with clinical judgement   ADL Overall ADL's : Needs assistance/impaired     Grooming: Minimal assistance;Wash/dry face;Oral care;Standing(tilt bed) Grooming Details (indicate cue type and reason): assist to open containers, squeeze tube, assist for open cup; built up handles, max A for hair to be completely brushed out and braided                                     Vision       Perception     Praxis      Cognition Arousal/Alertness: Awake/alert Behavior During Therapy: Anxious Overall Cognitive Status: Within Functional Limits for tasks assessed(continues to present with anxiety) Area of Impairment: Following commands;Safety/judgement;Awareness;Problem solving;Memory  Memory: Decreased short-term memory Following Commands: Follows one step commands inconsistently Safety/Judgement: Decreased awareness of safety;Decreased awareness of deficits Awareness: Emergent Problem Solving: Requires verbal cues;Requires tactile cues General Comments: anxiety better during session but remains to require step by step cues to avoid escalation of anxiety.          Exercises General Exercises - Lower Extremity Mini-Sqauts: AROM;Both;10  reps;Supine(tilted in vital go lift bed.  )   Shoulder Instructions       General Comments      Pertinent Vitals/ Pain       Pain Assessment: Faces Faces Pain Scale: Hurts little more Pain Location: B shins  Pain Descriptors / Indicators: Sore Pain Intervention(s): Monitored during session;Repositioned  Home Living                                          Prior Functioning/Environment              Frequency  Min 2X/week        Progress Toward Goals  OT Goals(current goals can now be found in the care plan section)  Progress towards OT goals: Progressing toward goals  Acute Rehab OT Goals Patient Stated Goal: to get better OT Goal Formulation: With patient Time For Goal Achievement: 02/04/18 Potential to Achieve Goals: Fair  Plan Discharge plan remains appropriate    Co-evaluation    PT/OT/SLP Co-Evaluation/Treatment: Yes Reason for Co-Treatment: Necessary to address cognition/behavior during functional activity;For patient/therapist safety PT goals addressed during session: Mobility/safety with mobility OT goals addressed during session: ADL's and self-care      AM-PAC PT "6 Clicks" Daily Activity     Outcome Measure   Help from another person eating meals?: A Lot Help from another person taking care of personal grooming?: A Lot Help from another person toileting, which includes using toliet, bedpan, or urinal?: Total Help from another person bathing (including washing, rinsing, drying)?: A Lot Help from another person to put on and taking off regular upper body clothing?: A Lot Help from another person to put on and taking off regular lower body clothing?: Total 6 Click Score: 10    End of Session Equipment Utilized During Treatment: Other (comment)(Vital go tilt bed)  OT Visit Diagnosis: Unsteadiness on feet (R26.81);Other abnormalities of gait and mobility (R26.89);Muscle weakness (generalized) (M62.81)   Activity Tolerance  Other (comment)(limited by extreme anxiety)   Patient Left in bed;with call bell/phone within reach;with SCD's reapplied   Nurse Communication Mobility status        Time: [id number] OT Time Calculation (min): 49 min  Charges:    Sherryl MangesLaura Keosha Rossa OTR/L [phone]    Evern BioLaura J Edword Cu 01/29/2018, 1:05 PM

## 2018-01-29 NOTE — Progress Notes (Signed)
Physical Therapy Treatment Patient Details Name: Debbie Golden MRN: [id number] DOB: 09-14-1971 Today's Date: 01/29/2018    History of Present Illness 47 y.o.femalewith medical history significant forbipolar disorder with history of catatonia in 2017, obesity, macrocytic anemia, history of genital herpes, and regular alcohol use presenting with reports of pain and numbness in both legs for hte past week. MRI of cervical and thoracic spine did not reveal explanation for pts symptoms. MRI lumbar spine pending. Review of CSF not consistent with Guillian-Barr. Pt started on IVIG on 1/23.    PT Comments    Pt performed tilt in vitalgo lift bed x 12 min and performed ADLs to improve UE strength and upper trunk control.  Pt performed B mini squats in tilted position with max VCs for completion of exercises.  In standing able to loosen chest strap and lower shin strap in standing.  Pt remains limited due to fear an anxiety and will continue to benefit from skilled PT in post acute setting to improve strength and function.     Follow Up Recommendations  SNF;Supervision/Assistance - 24 hour     Equipment Recommendations  None recommended by PT(TBA at next venue)    Recommendations for Other Services       Precautions / Restrictions Precautions Precautions: Fall Precaution Comments: knees buckle, pt very anxious with mobility Restrictions Weight Bearing Restrictions: No    Mobility  Bed Mobility Overal bed mobility: Needs Assistance             General bed mobility comments: For scoot in supine only to adjust and place straps to prepare for tilt therapy.  Max +2 with use of bed pad.    Transfers Overall transfer level: Needs assistance Equipment used: 2 person hand held assist(once in standing to improve anxiety, face to face hand held support.  ) Transfers: (Pt tilted into standing with total assistance of vital go tilt bed. )           General transfer comment: Pt positioned  and safety straps placed for tilt into standing.  Pt performed standing reaching with chest strap loosened to reach outside BOS.Pt tolerated around 12 min stand time with chest strap loosened and lower shin strap loosened.     Ambulation/Gait Ambulation/Gait assistance: (NT remains unable.  )               Stairs            Wheelchair Mobility    Modified Rankin (Stroke Patients Only)       Balance     Sitting balance-Leahy Scale: Fair(in chair position.  )       Standing balance-Leahy Scale: Poor Standing balance comment: required use of tilt frame and straps for support but able to loosen chest strap to perform reaching in standing outside BOS.                             Cognition Arousal/Alertness: Awake/alert Behavior During Therapy: Anxious Overall Cognitive Status: Within Functional Limits for tasks assessed(remains to present with anxiety.  ) Area of Impairment: Following commands;Safety/judgement;Awareness;Problem solving;Memory                     Memory: Decreased short-term memory Following Commands: Follows one step commands inconsistently Safety/Judgement: Decreased awareness of safety;Decreased awareness of deficits Awareness: Emergent Problem Solving: Requires verbal cues;Requires tactile cues General Comments: anxiety better during session but remains to require step by step cues  to avoid escalation of anxiety.        Exercises General Exercises - Lower Extremity Mini-Sqauts: AROM;Both;10 reps;Supine(tilted in vital go lift bed.  )    General Comments        Pertinent Vitals/Pain Pain Assessment: Faces Faces Pain Scale: Hurts little more Pain Location: B shins  Pain Descriptors / Indicators: Sore Pain Intervention(s): Monitored during session;Repositioned    Home Living                      Prior Function            PT Goals (current goals can now be found in the care plan section) Acute Rehab PT  Goals Patient Stated Goal: to get better Potential to Achieve Goals: Good Progress towards PT goals: Progressing toward goals    Frequency    Min 3X/week      PT Plan Current plan remains appropriate    Co-evaluation PT/OT/SLP Co-Evaluation/Treatment: Yes Reason for Co-Treatment: Necessary to address cognition/behavior during functional activity;For patient/therapist safety PT goals addressed during session: Mobility/safety with mobility;Balance        AM-PAC PT "6 Clicks" Daily Activity  Outcome Measure  Difficulty turning over in bed (including adjusting bedclothes, sheets and blankets)?: Unable Difficulty moving from lying on back to sitting on the side of the bed? : Unable Difficulty sitting down on and standing up from a chair with arms (e.g., wheelchair, bedside commode, etc,.)?: Unable Help needed moving to and from a bed to chair (including a wheelchair)?: Total Help needed walking in hospital room?: Total Help needed climbing 3-5 steps with a railing? : Total 6 Click Score: 6    End of Session Equipment Utilized During Treatment: Gait belt Activity Tolerance: Other (comment);Patient tolerated treatment well(anxiety remains) Patient left: in bed;with call bell/phone within reach(placed in chair position.  ) Nurse Communication: Mobility status(Informed nurse and NT to perform 2 additional tilts this afternoon.  ) PT Visit Diagnosis: Unsteadiness on feet (R26.81);Other abnormalities of gait and mobility (R26.89);Muscle weakness (generalized) (M62.81);Other symptoms and signs involving the nervous system (R29.898)     Time: [id number] PT Time Calculation (min) (ACUTE ONLY): 31 min  Charges:  $Therapeutic Activity: 8-22 mins                    G Codes:       Joycelyn RuaAimee Kristain Hu, PTA pager [phone]    Florestine AversAimee J Augie Vane 01/29/2018, 11:32 AM

## 2018-01-29 NOTE — Care Management Note (Signed)
Case Management Note Donn PieriniKristi Emory Gallentine RN, BSN Unit 4E-Case Manager-- 2H coverage [phone]  Patient Details  Name: Debbie Golden MRN: [id number] Date of Birth: 1971-04-07  Subjective/Objective:   Pt admitted with AMS and weakness                 Action/Plan: PTA Pt lived at home with boyfriend- CM to follow for transition of care needs- per PTeval - recommendation for SNF- CSW to f/u regarding possible placement needs- psych has also been consulted.   Expected Discharge Date:  01/29/18               Expected Discharge Plan:  Skilled Nursing Facility  In-House Referral:  Clinical Social Work  Discharge planning Services  CM Consult  Post Acute Care Choice:  NA Choice offered to:  NA  DME Arranged:    DME Agency:     HH Arranged:    HH Agency:     Status of Service:  Completed, signed off  If discussed at [employer] Meetings, dates discussed:    Discharge Disposition: skilled facility   Additional Comments:  01/29/18- 1220- Donn PieriniKristi Zuriel Yeaman RN, CM- still await insurance auth for SNF- CSW following- CM spoke with pt about PCP needs- [facility] # provided with info on use- pt can also use insurance card 800 # to get list of in-network providers.   01/28/18- 1045- Darielle Hancher RN, CM- pt stable for discharge- CSW following for SNF placement  Darrold SpanWebster, Nardos [place]nam [place], RN 01/29/2018, 12:26 PM

## 2018-01-29 NOTE — Clinical Social Work Note (Signed)
Insurance authorization still pending.  Shloimy Michalski, CSW [phone]  

## 2018-01-29 NOTE — Progress Notes (Signed)
[facility]                                                                              Patient Demographics  Debbie Golden, is a 47 y.o. female, DOB - 07-19-1971, ZOX:[id number]  Admit date - 01/20/2018   Admitting Physician Pieter Partridge, MD  Outpatient Primary MD for the patient is Patient, No Pcp Per  Outpatient specialists:   LOS - 9  days   Medical records reviewed and are as summarized below:    Chief Complaint  Patient presents with  . Knee Pain  . Weakness       Brief summary   HPI on 01/20/2018 by [name], NP [name] a 47 y.o.femalewith medical history significant forbipolar disorder with history of catatonia in 2017, obesity, macrocytic anemia, history of genital herpes, and regular alcohol use. Patient presented to [facility] the afternoon of 1/22 reporting pain and numbness in both legs greater in the left leg ongoing for a week. She reports chronic back problems and had a steroid injection 1 week prior. She reported increased difficulty with ambulation at home and no recent trauma. When I questioned the patient she reports she has been having difficulty ambulating and weakness/numbness/tingling in the legs waxing and waning for 1 month. CT of the head was unremarkable. Due to concerns of possible Guillain-Barr patient was subsequently transferred to this facility so she can undergo a lumbar puncture. After arrival to [facility] she was evaluated by neurology who documented on exam patient had absent generalized DTRs, mild bilateral lower extremity motor weakness averaging 4/5 from the hips down with decreased wrist flexion, extension, abduction bilaterally at 4/5 as well. She was not exhibiting any respiratory symptoms or signs of bowel or bladder incontinence or urinary retention. MRI of the cervical and thoracic spine did not reveal explanation for patient's current symptoms. Subsequent review of CSF is not consistent with  Guillian-Barr.Patient was admitted for further workup    Assessment & Plan    Principal Problem: Acute metabolic encephalopathy with underlying alcohol use, questionable withdrawal -Currently alert and oriented x3,  - patient was placed on CIWA call with Ativan however agitation appears to have worsened with Ativan administration. P CCM was consulted and patient was transferred to ICU for Precedex administration -MRI brain showed no acute intracranial pathology, mental status has been improving - psychiatry was also consulted and recommended Risperdal 0.5 mg BID PRN for hallucinations -Librium tapered to 5 mg twice a day - awaiting SNF insurance authorization    Active Problems:   Leg weakness, bilateral -Neurology was consulted initially suspecting possible Guillain-Barr syndrome. -MRI cervical, thoracic spine normal.  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, no associated spinal stenosis, small lumbar disc herniation at L1-2 without convincing neural impinge -Patient underwent spinal tap, no pleocytosis, protein was not elevated, neurology recommended 5 days IVIG, finished 1/29 -Currently awaiting skilled nursing facility bed    Obesity, history of gastric bypass -BMI 35.0 -Counseled on diet and weight control    Bipolar  disorder (HCC) -Continue Lamictal, Risperdal    Macrocytic anemia -Possibly due to alcohol use, AST > ALT and folate deficiency -B12 401, folate low 2.4, placed on folic acid daily -Vitamin B6 level low, 1.8, currently on IV pyridoxine, transition to oral pyridoxine 100 mg daily at discharge   Abnormal TSH TSH 4.7, free T4 1.15, recheck thyroid function outpatient in 4-6 weeks  Code Status: Full code DVT Prophylaxis:  Lovenox Family Communication: Discussed in detail with the patient, all imaging results, lab  results explained to the patient    Disposition Plan: awaiting SNF  Time Spent in minutes   15  minutes  Procedures:    Consultants:   Neurology Psychiatry  Antimicrobials:      Medications  Scheduled Meds: . aspirin EC  325 mg Oral Daily  . chlordiazePOXIDE  5 mg Oral BID  . enoxaparin (LOVENOX) injection  40 mg Subcutaneous Q24H  . folic acid  1 mg Oral Daily  . lamoTRIgine  100 mg Oral Daily  . multivitamin with minerals  1 tablet Oral Daily  . pyridOXINE  100 mg Intravenous Daily  . risperiDONE  2 mg Oral QHS  . thiamine  100 mg Oral Daily   Continuous Infusions: PRN Meds:.acetaminophen **OR** acetaminophen, docusate sodium, LORazepam, ondansetron **OR** ondansetron (ZOFRAN) IV, risperiDONE   Antibiotics   Anti-infectives (From admission, onward)   None        Subjective:   Debbie Golden was seen and examined today. Wants to go to rehab, no complaints.   Patient denies dizziness, chest pain, shortness of breath, abdominal pain, N/V/D/C. No acute events overnight.    Objective:   Vitals:   01/28/18 1238 01/28/18 1923 01/29/18 0450 01/29/18 0748  BP: 119/69 111/82 137/88 125/85  Pulse: (!) 124 (!) 110 (!) 110 (!) 105  Resp: 19 16 16 16   Temp: 98.7 F (37.1 C) 99.3 F (37.4 C) 98.4 F (36.9 C) 98.3 F (36.8 C)  TempSrc: Oral Oral Oral Oral  SpO2: 100% 100% 100% 99%  Weight:      Height:        Intake/Output Summary (Last 24 hours) at 01/29/2018 1407 Last data filed at 01/29/2018 0500 Gross per 24 hour  Intake -  Output 300 ml  Net -300 ml     Wt Readings from Last 3 Encounters:  01/27/18 101.6 kg (224 lb)  01/19/18 101.6 kg (224 lb)  11/09/16 83 kg (183 lb)     Exam    General: Alert and oriented x 3, NAD  Eyes:   HEENT:    Cardiovascular: S1 S2 auscultated, no rubs, murmurs or gallops. Regular rate and rhythm. No pedal edema b/l  Respiratory: Clear to auscultation bilaterally, no wheezing, rales or rhonchi  Gastrointestinal:  Soft, nontender, nondistended, + bowel sounds  Ext: no pedal edema bilaterally  Neuro: no new deficits  Musculoskeletal: No digital cyanosis, clubbing  Skin: No rashes  Psych: Normal affect and demeanor, alert and oriented x3     Data Reviewed:  I have personally reviewed following labs and imaging studies  Micro Results Recent Results (from the past 240 hour(s))  CSF culture     Status: None   Collection Time: 01/20/18  3:55 AM  Result Value Ref Range Status   Specimen Description CSF  Final   Special Requests NONE  Final   Gram Stain   Final    WBC PRESENT, PREDOMINANTLY MONONUCLEAR NO ORGANISMS SEEN CYTOSPIN SMEAR    Culture NO GROWTH 3 DAYS  Final   Report Status 01/23/2018 FINAL  Final  MRSA PCR Screening     Status: None   Collection Time: 01/20/18 11:33 AM  Result Value Ref Range Status   MRSA by PCR NEGATIVE NEGATIVE Final    Comment:        The GeneXpert MRSA Assay (FDA approved for NASAL specimens only), is one component of a comprehensive MRSA colonization surveillance program. It is not intended to diagnose MRSA infection nor to guide or monitor treatment for MRSA infections.     Radiology Reports Ct Head Wo Contrast  Result Date: 01/19/2018 CLINICAL DATA:  Pain and numbness in the legs. Symptoms have been going on for a week. EXAM: CT HEAD WITHOUT CONTRAST TECHNIQUE: Contiguous axial images were obtained from the base of the skull through the vertex without intravenous contrast. COMPARISON:  11/09/2016 FINDINGS: Brain: No evidence of acute infarction, hemorrhage, hydrocephalus, extra-axial collection or mass lesion/mass effect. Vascular: No hyperdense vessel or unexpected calcification. Skull: Normal. Negative for fracture or focal lesion. Sinuses/Orbits: Mucosal disease in the right sphenoid sinus. Other: None. IMPRESSION: No acute intracranial abnormality. Electronically Signed   By: Richarda OverlieAdam  Henn M.D.   On: 01/19/2018 21:54   Mr Brain Wo  Contrast  Result Date: 01/23/2018 CLINICAL DATA:  Lower extremity weakness.  No loosening a shins. EXAM: MRI HEAD WITHOUT CONTRAST TECHNIQUE: Multiplanar, multiecho pulse sequences of the brain and surrounding structures were obtained without intravenous contrast. COMPARISON:  Head CT 01/19/2018 FINDINGS: Brain: Brain has normal appearance without evidence of malformation, atrophy, old or acute small or large vessel infarction, mass lesion, hemorrhage, hydrocephalus or extra-axial collection. Vascular: Major vessels at the base of the brain show flow. Venous sinuses appear patent. Skull and upper cervical spine: Normal. Sinuses/Orbits: Mild seasonal mucosal thickening.  Orbits negative. Other: None significant. IMPRESSION: Normal MRI of the brain. Electronically Signed   By: Paulina FusiMark  Shogry M.D.   On: 01/23/2018 15:05   Mr Cervical Spine Wo Contrast  Result Date: 01/20/2018 CLINICAL DATA:  Initial evaluation for progressive ascending weakness and numbness. EXAM: MRI CERVICAL, THORACIC SPINE WITHOUT CONTRAST TECHNIQUE: Multiplanar and multiecho pulse sequences of the cervical spine, to include the craniocervical junction and cervicothoracic junction, and thoracic and lumbar spine, were obtained without intravenous contrast. COMPARISON:  None available. FINDINGS: MRI CERVICAL SPINE FINDINGS Alignment: Study moderately degraded by motion artifact. Straightening with slight reversal of the normal cervical lordosis, apex at C5. No listhesis. Vertebrae: Vertebral body heights are maintained without evidence for acute or chronic fracture. Bone marrow signal intensity within normal limits. No discrete or worrisome osseous lesions. No abnormal marrow edema. Cord: Signal intensity within cervical spinal cord within normal limits. No appreciable cord signal abnormality identified on this motion degraded exam. Posterior Fossa, vertebral arteries, paraspinal tissues: Visualized brain within normal limits. Craniocervical  junction normal. Scattered mucosal thickening noted with in the visualized paranasal sinuses. Paraspinous and prevertebral soft tissues within normal limits. Normal intravascular flow voids present within the vertebral arteries bilaterally. Disc levels: C2-C3: Unremarkable. C3-C4:  Unremarkable. C4-C5: Mild diffuse disc bulge with bilateral uncovertebral hypertrophy. Resultant mild right C5 foraminal stenosis. No significant canal narrowing. C5-C6: Chronic diffuse degenerative disc osteophyte with intervertebral disc space narrowing. Broad posterior component mildly flattens the ventral thecal sac without significant spinal stenosis. Moderate bilateral C6 foraminal stenosis, slightly worse on the right. C6-C7: Mild diffuse chronic disc osteophyte with intervertebral disc space narrowing. Flattening of the ventral thecal sac without significant spinal stenosis. Mild bilateral C7 foraminal stenosis. C7-T1: Mild facet  hypertrophy. Otherwise unremarkable without stenosis. MRI THORACIC SPINE FINDINGS Alignment: Vertebral bodies normally aligned with preservation of the normal thoracic kyphosis. No listhesis. Vertebrae: Vertebral body heights are maintained without evidence for acute or chronic fracture. Bone marrow signal intensity within normal limits. No discrete or worrisome osseous lesions. No abnormal marrow edema. Cord: Signal intensity within the thoracic spinal cord within normal limits. No cord signal abnormality seen on this motion degraded study. Conus medullaris terminates at the L1 level. Overall cord caliber normal. Paraspinal and other soft tissues: Paraspinous soft tissues within normal limits. Partially visualized lungs are grossly clear. Visualized visceral structures are unremarkable. Disc levels: T7-8: Mild disc bulge without significant stenosis. L1-2: Seen only on sagittal projection. Disc bulge without significant canal stenosis. No foraminal encroachment. No other significant degenerative changes  seen within the thoracic spine. No significant canal or neural foraminal stenosis. IMPRESSION: MRI CERVICAL SPINE IMPRESSION: 1. Normal MRI appearance of the cervical spinal cord. 2. 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 Mild to moderate bilateral foraminal narrowing at these levels as above. MRI THORACIC SPINE IMPRESSION: 1. Normal MRI appearance of the thoracic spinal cord. 2. Minimal disc bulging at T7-8 without stenosis. No other significant degenerative changes within the thoracic spine. No significant stenosis. Electronically Signed   By: Rise Mu M.D.   On: 01/20/2018 06:42   Mr Thoracic Spine Wo Contrast  Result Date: 01/20/2018 CLINICAL DATA:  Initial evaluation for progressive ascending weakness and numbness. EXAM: MRI CERVICAL, THORACIC SPINE WITHOUT CONTRAST TECHNIQUE: Multiplanar and multiecho pulse sequences of the cervical spine, to include the craniocervical junction and cervicothoracic junction, and thoracic and lumbar spine, were obtained without intravenous contrast. COMPARISON:  None available. FINDINGS: MRI CERVICAL SPINE FINDINGS Alignment: Study moderately degraded by motion artifact. Straightening with slight reversal of the normal cervical lordosis, apex at C5. No listhesis. Vertebrae: Vertebral body heights are maintained without evidence for acute or chronic fracture. Bone marrow signal intensity within normal limits. No discrete or worrisome osseous lesions. No abnormal marrow edema. Cord: Signal intensity within cervical spinal cord within normal limits. No appreciable cord signal abnormality identified on this motion degraded exam. Posterior Fossa, vertebral arteries, paraspinal tissues: Visualized brain within normal limits. Craniocervical junction normal. Scattered mucosal thickening noted with in the visualized paranasal sinuses. Paraspinous and prevertebral soft tissues within normal limits. Normal intravascular flow  voids present within the vertebral arteries bilaterally. Disc levels: C2-C3: Unremarkable. C3-C4:  Unremarkable. C4-C5: Mild diffuse disc bulge with bilateral uncovertebral hypertrophy. Resultant mild right C5 foraminal stenosis. No significant canal narrowing. C5-C6: Chronic diffuse degenerative disc osteophyte with intervertebral disc space narrowing. Broad posterior component mildly flattens the ventral thecal sac without significant spinal stenosis. Moderate bilateral C6 foraminal stenosis, slightly worse on the right. C6-C7: Mild diffuse chronic disc osteophyte with intervertebral disc space narrowing. Flattening of the ventral thecal sac without significant spinal stenosis. Mild bilateral C7 foraminal stenosis. C7-T1: Mild facet hypertrophy. Otherwise unremarkable without stenosis. MRI THORACIC SPINE FINDINGS Alignment: Vertebral bodies normally aligned with preservation of the normal thoracic kyphosis. No listhesis. Vertebrae: Vertebral body heights are maintained without evidence for acute or chronic fracture. Bone marrow signal intensity within normal limits. No discrete or worrisome osseous lesions. No abnormal marrow edema. Cord: Signal intensity within the thoracic spinal cord within normal limits. No cord signal abnormality seen on this motion degraded study. Conus medullaris terminates at the L1 level. Overall cord caliber normal. Paraspinal and other soft tissues: Paraspinous soft tissues within normal limits. Partially  visualized lungs are grossly clear. Visualized visceral structures are unremarkable. Disc levels: T7-8: Mild disc bulge without significant stenosis. L1-2: Seen only on sagittal projection. Disc bulge without significant canal stenosis. No foraminal encroachment. No other significant degenerative changes seen within the thoracic spine. No significant canal or neural foraminal stenosis. IMPRESSION: MRI CERVICAL SPINE IMPRESSION: 1. Normal MRI appearance of the cervical spinal cord. 2.  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 Mild to moderate bilateral foraminal narrowing at these levels as above. MRI THORACIC SPINE IMPRESSION: 1. Normal MRI appearance of the thoracic spinal cord. 2. Minimal disc bulging at T7-8 without stenosis. No other significant degenerative changes within the thoracic spine. No significant stenosis. Electronically Signed   By: Rise Mu M.D.   On: 01/20/2018 06:42   Mr Lumbar Spine W Wo Contrast  Result Date: 01/20/2018 CLINICAL DATA:  47 year old female with progressive ascending weakness and numbness. EXAM: MRI LUMBAR SPINE WITHOUT AND WITH CONTRAST TECHNIQUE: Multiplanar and multiecho pulse sequences of the lumbar spine were obtained without and with intravenous contrast. CONTRAST:  20mL MULTIHANCE GADOBENATE DIMEGLUMINE 529 MG/ML IV SOLN COMPARISON:  Cervical and thoracic spine MRI 0534 hr today. Lumbar puncture images 0 347 hr today. FINDINGS: Segmentation: Normal lumbar segmentation demonstrated on the LP images. This numbering appears concordant with the cervical and thoracic spine numbering earlier today. Alignment:  Preserved lumbar lordosis. Vertebrae: Chronic degenerative endplate marrow signal changes at L5-S1. Elsewhere Visualized bone marrow signal is within normal limits. No marrow edema or evidence of acute osseous abnormality. Intact visible sacrum and SI joints. Conus medullaris and cauda equina: Conus extends to the T12-L1 level. Normal conus. No abnormal intradural enhancement. No thickening of the cauda equina, nerve roots appear normal. Paraspinal and other soft tissues: Negative visualized abdominal viscera. Posterior paraspinal soft tissues are within normal limits; there is a degree of diffuse paraspinal muscle atrophy. Disc levels: T12-L1:  Negative. L1-L2: Small central to slightly left paracentral disc herniation (series 6, image 10). No associated stenosis. L2-L3:  Mild facet  hypertrophy.  No stenosis. L3-L4:  Mild facet hypertrophy.  No stenosis. L4-L5:  Mild facet hypertrophy.  No stenosis. L5-S1: Advanced disc space loss. Probable vacuum disc. Circumferential disc osteophyte complex with broad-based posterior component. Mild facet hypertrophy. Mild epidural lipomatosis. No significant spinal stenosis or convincing lateral recess stenosis. There is moderate left and mild right L5 neural foraminal stenosis. IMPRESSION: 1. Normal lower thoracic spinal cord and cauda equina. No evidence of intradural inflammation. 2. Advanced chronic lumbar disc degeneration at L5-S1. No associated spinal stenosis, but mild to moderate L5 neural foraminal stenosis greater on the left. 3. Small lumbar disc herniation at L1-L2 without convincing neural impingement. 4.  No acute osseous abnormality. Electronically Signed   By: Odessa Fleming M.D.   On: 01/20/2018 11:46   Dg Fluoro Guide Lumbar Puncture  Result Date: 01/20/2018 CLINICAL DATA:  Numbness in the legs and arms. EXAM: DIAGNOSTIC LUMBAR PUNCTURE UNDER FLUOROSCOPIC GUIDANCE FLUOROSCOPY TIME:  Fluoroscopy Time:  1 minute 0 seconds. Radiation Exposure Index (if provided by the fluoroscopic device): Dose area product is 270 micro Gy meter square. Reference air kerma is 21 mGy Number of Acquired Spot Images: 1 PROCEDURE: Informed consent was obtained from the patient prior to the procedure, including potential complications of headache, allergy, and pain. With the patient prone, the lower back was prepped with Betadine. 1% Lidocaine was used for local anesthesia. Lumbar puncture was performed at the L4-5 level using a 20 gauge  needle with return of clear CSF. 5 ml of CSF were obtained for laboratory studies. During collection for the third tube, the patient moved, dislodging the needle and the procedure was ended. The last of the fluid became blood-tinged after the needle was dislodged. Opening pressure was not obtained as the patient was unable to  tolerate positioning for opening pressure. Subjectively, the pressure was not elevated. The patient tolerated the procedure well and there were no apparent complications. Patient complained of loss of feeling in her legs both before and after the procedure without significant change. IMPRESSION: Lumbar puncture performed under fluoroscopy at the L4-5 level. 5 mL clear CSF was obtained. Patient tolerated the procedure well without complication. Electronically Signed   By: Burman Nieves M.D.   On: 01/20/2018 04:21    Lab Data:  CBC: Recent Labs  Lab 01/23/18 0351 01/25/18 0244 01/26/18 0233 01/27/18 0617  WBC 6.5 5.1 4.5 5.9  HGB 8.1* 8.3* 8.5* 8.5*  HCT 25.4* 25.5* 25.8* 25.6*  MCV 125.7* 124.4* 124.6* 123.7*  PLT 251 287 308 391   Basic Metabolic Panel: Recent Labs  Lab 01/23/18 0351 01/25/18 0244 01/26/18 0233 01/27/18 0617  NA 140 133* 136 136  K 4.2 3.7 3.3* 3.7  CL 113* 104 108 108  CO2 20* 20* 22 22  GLUCOSE 118* 88 95 88  BUN <5* <5* 5* 6  CREATININE 0.63 0.68 0.65 0.59  CALCIUM 8.3* 8.3* 8.0* 8.2*  MG  --  1.9  --  2.1  PHOS  --  3.5  --  2.8   GFR: Estimated Creatinine Clearance: 106.5 mL/min (by C-G formula based on SCr of 0.59 mg/dL). Liver Function Tests: No results for input(s): AST, ALT, ALKPHOS, BILITOT, PROT, ALBUMIN in the last 168 hours. No results for input(s): LIPASE, AMYLASE in the last 168 hours. No results for input(s): AMMONIA in the last 168 hours. Coagulation Profile: No results for input(s): INR, PROTIME in the last 168 hours. Cardiac Enzymes: Recent Labs  Lab 01/22/18 1718  CKTOTAL 62   BNP (last 3 results) No results for input(s): PROBNP in the last 8760 hours. HbA1C: No results for input(s): HGBA1C in the last 72 hours. CBG: Recent Labs  Lab 01/22/18 1534  GLUCAP 114*   Lipid Profile: No results for input(s): CHOL, HDL, LDLCALC, TRIG, CHOLHDL, LDLDIRECT in the last 72 hours. Thyroid Function Tests: No results for input(s):  TSH, T4TOTAL, FREET4, T3FREE, THYROIDAB in the last 72 hours. Anemia Panel: No results for input(s): VITAMINB12, FOLATE, FERRITIN, TIBC, IRON, RETICCTPCT in the last 72 hours. Urine analysis:    Component Value Date/Time   COLORURINE AMBER (A) 01/19/2018 2058   APPEARANCEUR HAZY (A) 01/19/2018 2058   LABSPEC 1.013 01/19/2018 2058   PHURINE 6.0 01/19/2018 2058   GLUCOSEU NEGATIVE 01/19/2018 2058   HGBUR NEGATIVE 01/19/2018 2058   BILIRUBINUR NEGATIVE 01/19/2018 2058   KETONESUR NEGATIVE 01/19/2018 2058   PROTEINUR NEGATIVE 01/19/2018 2058   NITRITE NEGATIVE 01/19/2018 2058   LEUKOCYTESUR NEGATIVE 01/19/2018 4098     Theodosia Bahena M.D. [facility] 01/29/2018, 2:07 PM  Pager: [phone] Between 7am to 7pm - call Pager - [phone]  After 7pm go to [email or web address] - password [email or web address]  Call night coverage person covering after 7pm

## 2018-01-30 LAB — VITAMIN B1: Vitamin B1 (Thiamine): 146.7 nmol/L (ref 66.5–200.0)

## 2018-01-30 MED ORDER — TRAMADOL HCL 50 MG PO TABS: 50.0000 mg | ORAL_TABLET | Freq: Three times a day (TID) | ORAL | 0 refills | Status: DC | PRN

## 2018-01-30 MED ORDER — CHLORDIAZEPOXIDE HCL 5 MG PO CAPS
5.0000 mg | ORAL_CAPSULE | Freq: Every day | ORAL | Status: DC
  Administered 2018-01-31 – 2018-02-01 (×2): 5 mg via ORAL
  Filled 2018-01-30 (×2): qty 1

## 2018-01-30 MED ORDER — CHLORDIAZEPOXIDE HCL 5 MG PO CAPS
5.0000 mg | ORAL_CAPSULE | Freq: Two times a day (BID) | ORAL | Status: AC
  Administered 2018-01-30: 5 mg via ORAL
  Filled 2018-01-30: qty 1

## 2018-01-30 MED ORDER — CHLORDIAZEPOXIDE HCL 5 MG PO CAPS: ORAL_CAPSULE | ORAL | 0 refills | Status: DC

## 2018-01-30 MED ORDER — TRAMADOL HCL 50 MG PO TABS
50.0000 mg | ORAL_TABLET | Freq: Three times a day (TID) | ORAL | Status: DC | PRN
  Administered 2018-01-31 – 2018-02-01 (×3): 50 mg via ORAL
  Filled 2018-01-30 (×3): qty 1

## 2018-01-30 NOTE — Progress Notes (Addendum)
[facility]                                                                              Patient Demographics  Debbie Golden, is a 47 y.o. female, DOB - 10-29-1971, ZOX:[id number]  Admit date - 01/20/2018   Admitting Physician Pieter Partridge, MD  Outpatient Primary MD for the patient is Patient, No Pcp Per  Outpatient specialists:   LOS - 10  days   Medical records reviewed and are as summarized below:    Chief Complaint  Patient presents with  . Knee Pain  . Weakness       Brief summary   HPI on 01/20/2018 by [name], NP Debbie Golden a 47 y.o.femalewith medical history significant forbipolar disorder with history of catatonia in 2017, obesity, macrocytic anemia, history of genital herpes, and regular alcohol use. Patient presented to [facility] the afternoon of 1/22 reporting pain and numbness in both legs greater in the left leg ongoing for a week. She reports chronic back problems and had a steroid injection 1 week prior. She reported increased difficulty with ambulation at home and no recent trauma. When I questioned the patient she reports she has been having difficulty ambulating and weakness/numbness/tingling in the legs waxing and waning for 1 month. CT of the head was unremarkable. Due to concerns of possible Guillain-Barr patient was subsequently transferred to this facility so she can undergo a lumbar puncture. After arrival to [facility] she was evaluated by neurology who documented on exam patient had absent generalized DTRs, mild bilateral lower extremity motor weakness averaging 4/5 from the hips down with decreased wrist flexion, extension, abduction bilaterally at 4/5 as well. She was not exhibiting any respiratory symptoms or signs of bowel or bladder incontinence or urinary retention. MRI of the cervical and thoracic spine did not reveal explanation for patient's current symptoms. Subsequent review of CSF is not consistent with  Guillian-Barr.Patient was admitted for further workup    Assessment & Plan    Principal Problem: Acute metabolic encephalopathy with underlying alcohol use, questionable withdrawal -Currently alert and oriented x3,  - patient was placed on CIWA call with Ativan however agitation appears to have worsened with Ativan administration. P CCM was consulted and patient was transferred to ICU for Precedex administration -MRI brain showed no acute intracranial pathology, mental status has been improving - psychiatry was also consulted and recommended Risperdal 0.5 mg BID PRN for hallucinations -Will taper Librium to 5 mg daily from tomorrow, for 3 days then taper to 2.5 mg daily for 3 days then off -Pending skilled nursing facility authorization otherwise medically stable   Active Problems:   Leg weakness, bilateral -Neurology was consulted initially suspecting possible Guillain-Barr syndrome. -MRI cervical, thoracic spine normal.  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, no associated spinal stenosis, small lumbar disc herniation at L1-2 without convincing neural impinge -Patient underwent spinal tap, no pleocytosis, protein was not elevated, neurology recommended 5 days IVIG, finished 1/29 -Currently awaiting skilled nursing facility bed  Obesity, history of gastric bypass -BMI 35.0 -Counseled on diet and weight control    Bipolar disorder (HCC) -Continue Lamictal, Risperdal    Macrocytic anemia -Possibly due to alcohol use, AST > ALT and folate deficiency -B12 401, folate low 2.4, placed on folic acid daily -Vitamin B6 level low, 1.8, currently on IV pyridoxine, transition to oral pyridoxine 100 mg daily at discharge   Abnormal TSH TSH 4.7, free T4 1.15, recheck thyroid function outpatient in 4-6 weeks  Code Status: Full code DVT  Prophylaxis:  Lovenox Family Communication: Discussed in detail with the patient, all imaging results, lab results explained to the patient    Disposition Plan: Awaiting skilled nursing facility authorization, patient medically stable for discharge Time Spent in minutes   15  minutes  Procedures:    Consultants:   Neurology Psychiatry  Antimicrobials:      Medications  Scheduled Meds: . aspirin EC  325 mg Oral Daily  . chlordiazePOXIDE  5 mg Oral BID  . enoxaparin (LOVENOX) injection  40 mg Subcutaneous Q24H  . folic acid  1 mg Oral Daily  . lamoTRIgine  100 mg Oral Daily  . multivitamin with minerals  1 tablet Oral Daily  . pyridOXINE  100 mg Intravenous Daily  . risperiDONE  2 mg Oral QHS  . thiamine  100 mg Oral Daily   Continuous Infusions: PRN Meds:.acetaminophen **OR** acetaminophen, docusate sodium, LORazepam, ondansetron **OR** ondansetron (ZOFRAN) IV, risperiDONE   Antibiotics   Anti-infectives (From admission, onward)   None        Subjective:   Debbie Golden was seen and examined today.  No complaints per patient.   Patient denies dizziness, chest pain, shortness of breath, abdominal pain, N/V/D/C.  No fevers, no acute issues overnight.  Objective:   Vitals:   01/29/18 0748 01/29/18 1452 01/29/18 1957 01/30/18 0423  BP: 125/85 137/88 131/80 (!) 120/91  Pulse: (!) 105 (!) 108 (!) 102 (!) 109  Resp: 16 19 (!) 23 (!) 29  Temp: 98.3 F (36.8 C) 98.1 F (36.7 C) 98.5 F (36.9 C) 98.1 F (36.7 C)  TempSrc: Oral Oral Oral Oral  SpO2: 99% 100% 99% 99%  Weight:    106.1 kg (234 lb)  Height:        Intake/Output Summary (Last 24 hours) at 01/30/2018 1228 Last data filed at 01/30/2018 0448 Gross per 24 hour  Intake -  Output 1300 ml  Net -1300 ml     Wt Readings from Last 3 Encounters:  01/30/18 106.1 kg (234 lb)  01/19/18 101.6 kg (224 lb)  11/09/16 83 kg (183 lb)     Exam    general: Alert and oriented x 3, NAD  Eyes:   HEENT:    Cardiovascular: S1 S2 clear, RRR No pedal edema b/l  Respiratory: Clear to auscultation bilaterally, no wheezing, rales or rhonchi  Gastrointestinal: Soft, nontender, nondistended, + bowel sounds  Ext: no pedal edema bilaterally  Neuro: no new deficits  Musculoskeletal: No digital cyanosis, clubbing  Skin: No rashes  Psych: Normal affect and demeanor, alert and oriented x3     Data Reviewed:  I have personally reviewed following labs and imaging studies  Micro Results No results found for this or any previous visit (from the past 240 hour(s)).  Radiology Reports Ct Head Wo Contrast  Result Date: 01/19/2018 CLINICAL DATA:  Pain and numbness in the legs. Symptoms have been going on for a week. EXAM: CT HEAD WITHOUT CONTRAST TECHNIQUE: Contiguous axial images were  obtained from the base of the skull through the vertex without intravenous contrast. COMPARISON:  11/09/2016 FINDINGS: Brain: No evidence of acute infarction, hemorrhage, hydrocephalus, extra-axial collection or mass lesion/mass effect. Vascular: No hyperdense vessel or unexpected calcification. Skull: Normal. Negative for fracture or focal lesion. Sinuses/Orbits: Mucosal disease in the right sphenoid sinus. Other: None. IMPRESSION: No acute intracranial abnormality. Electronically Signed   By: Richarda OverlieAdam  Henn M.D.   On: 01/19/2018 21:54   Mr Brain Wo Contrast  Result Date: 01/23/2018 CLINICAL DATA:  Lower extremity weakness.  No loosening a shins. EXAM: MRI HEAD WITHOUT CONTRAST TECHNIQUE: Multiplanar, multiecho pulse sequences of the brain and surrounding structures were obtained without intravenous contrast. COMPARISON:  Head CT 01/19/2018 FINDINGS: Brain: Brain has normal appearance without evidence of malformation, atrophy, old or acute small or large vessel infarction, mass lesion, hemorrhage, hydrocephalus or extra-axial collection. Vascular: Major vessels at the base of the brain show flow. Venous sinuses appear patent.  Skull and upper cervical spine: Normal. Sinuses/Orbits: Mild seasonal mucosal thickening.  Orbits negative. Other: None significant. IMPRESSION: Normal MRI of the brain. Electronically Signed   By: Paulina FusiMark  Shogry M.D.   On: 01/23/2018 15:05   Mr Cervical Spine Wo Contrast  Result Date: 01/20/2018 CLINICAL DATA:  Initial evaluation for progressive ascending weakness and numbness. EXAM: MRI CERVICAL, THORACIC SPINE WITHOUT CONTRAST TECHNIQUE: Multiplanar and multiecho pulse sequences of the cervical spine, to include the craniocervical junction and cervicothoracic junction, and thoracic and lumbar spine, were obtained without intravenous contrast. COMPARISON:  None available. FINDINGS: MRI CERVICAL SPINE FINDINGS Alignment: Study moderately degraded by motion artifact. Straightening with slight reversal of the normal cervical lordosis, apex at C5. No listhesis. Vertebrae: Vertebral body heights are maintained without evidence for acute or chronic fracture. Bone marrow signal intensity within normal limits. No discrete or worrisome osseous lesions. No abnormal marrow edema. Cord: Signal intensity within cervical spinal cord within normal limits. No appreciable cord signal abnormality identified on this motion degraded exam. Posterior Fossa, vertebral arteries, paraspinal tissues: Visualized brain within normal limits. Craniocervical junction normal. Scattered mucosal thickening noted with in the visualized paranasal sinuses. Paraspinous and prevertebral soft tissues within normal limits. Normal intravascular flow voids present within the vertebral arteries bilaterally. Disc levels: C2-C3: Unremarkable. C3-C4:  Unremarkable. C4-C5: Mild diffuse disc bulge with bilateral uncovertebral hypertrophy. Resultant mild right C5 foraminal stenosis. No significant canal narrowing. C5-C6: Chronic diffuse degenerative disc osteophyte with intervertebral disc space narrowing. Broad posterior component mildly flattens the ventral  thecal sac without significant spinal stenosis. Moderate bilateral C6 foraminal stenosis, slightly worse on the right. C6-C7: Mild diffuse chronic disc osteophyte with intervertebral disc space narrowing. Flattening of the ventral thecal sac without significant spinal stenosis. Mild bilateral C7 foraminal stenosis. C7-T1: Mild facet hypertrophy. Otherwise unremarkable without stenosis. MRI THORACIC SPINE FINDINGS Alignment: Vertebral bodies normally aligned with preservation of the normal thoracic kyphosis. No listhesis. Vertebrae: Vertebral body heights are maintained without evidence for acute or chronic fracture. Bone marrow signal intensity within normal limits. No discrete or worrisome osseous lesions. No abnormal marrow edema. Cord: Signal intensity within the thoracic spinal cord within normal limits. No cord signal abnormality seen on this motion degraded study. Conus medullaris terminates at the L1 level. Overall cord caliber normal. Paraspinal and other soft tissues: Paraspinous soft tissues within normal limits. Partially visualized lungs are grossly clear. Visualized visceral structures are unremarkable. Disc levels: T7-8: Mild disc bulge without significant stenosis. L1-2: Seen only on sagittal projection. Disc bulge without significant canal stenosis. No  foraminal encroachment. No other significant degenerative changes seen within the thoracic spine. No significant canal or neural foraminal stenosis. IMPRESSION: MRI CERVICAL SPINE IMPRESSION: 1. Normal MRI appearance of the cervical spinal cord. 2. 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 Mild to moderate bilateral foraminal narrowing at these levels as above. MRI THORACIC SPINE IMPRESSION: 1. Normal MRI appearance of the thoracic spinal cord. 2. Minimal disc bulging at T7-8 without stenosis. No other significant degenerative changes within the thoracic spine. No significant stenosis. Electronically Signed    By: Rise Mu M.D.   On: 01/20/2018 06:42   Mr Thoracic Spine Wo Contrast  Result Date: 01/20/2018 CLINICAL DATA:  Initial evaluation for progressive ascending weakness and numbness. EXAM: MRI CERVICAL, THORACIC SPINE WITHOUT CONTRAST TECHNIQUE: Multiplanar and multiecho pulse sequences of the cervical spine, to include the craniocervical junction and cervicothoracic junction, and thoracic and lumbar spine, were obtained without intravenous contrast. COMPARISON:  None available. FINDINGS: MRI CERVICAL SPINE FINDINGS Alignment: Study moderately degraded by motion artifact. Straightening with slight reversal of the normal cervical lordosis, apex at C5. No listhesis. Vertebrae: Vertebral body heights are maintained without evidence for acute or chronic fracture. Bone marrow signal intensity within normal limits. No discrete or worrisome osseous lesions. No abnormal marrow edema. Cord: Signal intensity within cervical spinal cord within normal limits. No appreciable cord signal abnormality identified on this motion degraded exam. Posterior Fossa, vertebral arteries, paraspinal tissues: Visualized brain within normal limits. Craniocervical junction normal. Scattered mucosal thickening noted with in the visualized paranasal sinuses. Paraspinous and prevertebral soft tissues within normal limits. Normal intravascular flow voids present within the vertebral arteries bilaterally. Disc levels: C2-C3: Unremarkable. C3-C4:  Unremarkable. C4-C5: Mild diffuse disc bulge with bilateral uncovertebral hypertrophy. Resultant mild right C5 foraminal stenosis. No significant canal narrowing. C5-C6: Chronic diffuse degenerative disc osteophyte with intervertebral disc space narrowing. Broad posterior component mildly flattens the ventral thecal sac without significant spinal stenosis. Moderate bilateral C6 foraminal stenosis, slightly worse on the right. C6-C7: Mild diffuse chronic disc osteophyte with intervertebral disc  space narrowing. Flattening of the ventral thecal sac without significant spinal stenosis. Mild bilateral C7 foraminal stenosis. C7-T1: Mild facet hypertrophy. Otherwise unremarkable without stenosis. MRI THORACIC SPINE FINDINGS Alignment: Vertebral bodies normally aligned with preservation of the normal thoracic kyphosis. No listhesis. Vertebrae: Vertebral body heights are maintained without evidence for acute or chronic fracture. Bone marrow signal intensity within normal limits. No discrete or worrisome osseous lesions. No abnormal marrow edema. Cord: Signal intensity within the thoracic spinal cord within normal limits. No cord signal abnormality seen on this motion degraded study. Conus medullaris terminates at the L1 level. Overall cord caliber normal. Paraspinal and other soft tissues: Paraspinous soft tissues within normal limits. Partially visualized lungs are grossly clear. Visualized visceral structures are unremarkable. Disc levels: T7-8: Mild disc bulge without significant stenosis. L1-2: Seen only on sagittal projection. Disc bulge without significant canal stenosis. No foraminal encroachment. No other significant degenerative changes seen within the thoracic spine. No significant canal or neural foraminal stenosis. IMPRESSION: MRI CERVICAL SPINE IMPRESSION: 1. Normal MRI appearance of the cervical spinal cord. 2. 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 Mild to moderate bilateral foraminal narrowing at these levels as above. MRI THORACIC SPINE IMPRESSION: 1. Normal MRI appearance of the thoracic spinal cord. 2. Minimal disc bulging at T7-8 without stenosis. No other significant degenerative changes within the thoracic spine. No significant stenosis. Electronically Signed   By: Rise Mu  M.D.   On: 01/20/2018 06:42   Mr Lumbar Spine W Wo Contrast  Result Date: 01/20/2018 CLINICAL DATA:  47 year old female with progressive ascending weakness  and numbness. EXAM: MRI LUMBAR SPINE WITHOUT AND WITH CONTRAST TECHNIQUE: Multiplanar and multiecho pulse sequences of the lumbar spine were obtained without and with intravenous contrast. CONTRAST:  20mL MULTIHANCE GADOBENATE DIMEGLUMINE 529 MG/ML IV SOLN COMPARISON:  Cervical and thoracic spine MRI 0534 hr today. Lumbar puncture images 0 347 hr today. FINDINGS: Segmentation: Normal lumbar segmentation demonstrated on the LP images. This numbering appears concordant with the cervical and thoracic spine numbering earlier today. Alignment:  Preserved lumbar lordosis. Vertebrae: Chronic degenerative endplate marrow signal changes at L5-S1. Elsewhere Visualized bone marrow signal is within normal limits. No marrow edema or evidence of acute osseous abnormality. Intact visible sacrum and SI joints. Conus medullaris and cauda equina: Conus extends to the T12-L1 level. Normal conus. No abnormal intradural enhancement. No thickening of the cauda equina, nerve roots appear normal. Paraspinal and other soft tissues: Negative visualized abdominal viscera. Posterior paraspinal soft tissues are within normal limits; there is a degree of diffuse paraspinal muscle atrophy. Disc levels: T12-L1:  Negative. L1-L2: Small central to slightly left paracentral disc herniation (series 6, image 10). No associated stenosis. L2-L3:  Mild facet hypertrophy.  No stenosis. L3-L4:  Mild facet hypertrophy.  No stenosis. L4-L5:  Mild facet hypertrophy.  No stenosis. L5-S1: Advanced disc space loss. Probable vacuum disc. Circumferential disc osteophyte complex with broad-based posterior component. Mild facet hypertrophy. Mild epidural lipomatosis. No significant spinal stenosis or convincing lateral recess stenosis. There is moderate left and mild right L5 neural foraminal stenosis. IMPRESSION: 1. Normal lower thoracic spinal cord and cauda equina. No evidence of intradural inflammation. 2. Advanced chronic lumbar disc degeneration at L5-S1. No  associated spinal stenosis, but mild to moderate L5 neural foraminal stenosis greater on the left. 3. Small lumbar disc herniation at L1-L2 without convincing neural impingement. 4.  No acute osseous abnormality. Electronically Signed   By: Odessa Fleming M.D.   On: 01/20/2018 11:46   Dg Fluoro Guide Lumbar Puncture  Result Date: 01/20/2018 CLINICAL DATA:  Numbness in the legs and arms. EXAM: DIAGNOSTIC LUMBAR PUNCTURE UNDER FLUOROSCOPIC GUIDANCE FLUOROSCOPY TIME:  Fluoroscopy Time:  1 minute 0 seconds. Radiation Exposure Index (if provided by the fluoroscopic device): Dose area product is 270 micro Gy meter square. Reference air kerma is 21 mGy Number of Acquired Spot Images: 1 PROCEDURE: Informed consent was obtained from the patient prior to the procedure, including potential complications of headache, allergy, and pain. With the patient prone, the lower back was prepped with Betadine. 1% Lidocaine was used for local anesthesia. Lumbar puncture was performed at the L4-5 level using a 20 gauge needle with return of clear CSF. 5 ml of CSF were obtained for laboratory studies. During collection for the third tube, the patient moved, dislodging the needle and the procedure was ended. The last of the fluid became blood-tinged after the needle was dislodged. Opening pressure was not obtained as the patient was unable to tolerate positioning for opening pressure. Subjectively, the pressure was not elevated. The patient tolerated the procedure well and there were no apparent complications. Patient complained of loss of feeling in her legs both before and after the procedure without significant change. IMPRESSION: Lumbar puncture performed under fluoroscopy at the L4-5 level. 5 mL clear CSF was obtained. Patient tolerated the procedure well without complication. Electronically Signed   By: Marisa Cyphers.D.  On: 01/20/2018 04:21    Lab Data:  CBC: Recent Labs  Lab 01/25/18 0244 01/26/18 0233 01/27/18 0617    WBC 5.1 4.5 5.9  HGB 8.3* 8.5* 8.5*  HCT 25.5* 25.8* 25.6*  MCV 124.4* 124.6* 123.7*  PLT 287 308 391   Basic Metabolic Panel: Recent Labs  Lab 01/25/18 0244 01/26/18 0233 01/27/18 0617  NA 133* 136 136  K 3.7 3.3* 3.7  CL 104 108 108  CO2 20* 22 22  GLUCOSE 88 95 88  BUN <5* 5* 6  CREATININE 0.68 0.65 0.59  CALCIUM 8.3* 8.0* 8.2*  MG 1.9  --  2.1  PHOS 3.5  --  2.8   GFR: Estimated Creatinine Clearance: 109 mL/min (by C-G formula based on SCr of 0.59 mg/dL). Liver Function Tests: No results for input(s): AST, ALT, ALKPHOS, BILITOT, PROT, ALBUMIN in the last 168 hours. No results for input(s): LIPASE, AMYLASE in the last 168 hours. No results for input(s): AMMONIA in the last 168 hours. Coagulation Profile: No results for input(s): INR, PROTIME in the last 168 hours. Cardiac Enzymes: No results for input(s): CKTOTAL, CKMB, CKMBINDEX, TROPONINI in the last 168 hours. BNP (last 3 results) No results for input(s): PROBNP in the last 8760 hours. HbA1C: No results for input(s): HGBA1C in the last 72 hours. CBG: No results for input(s): GLUCAP in the last 168 hours. Lipid Profile: No results for input(s): CHOL, HDL, LDLCALC, TRIG, CHOLHDL, LDLDIRECT in the last 72 hours. Thyroid Function Tests: No results for input(s): TSH, T4TOTAL, FREET4, T3FREE, THYROIDAB in the last 72 hours. Anemia Panel: No results for input(s): VITAMINB12, FOLATE, FERRITIN, TIBC, IRON, RETICCTPCT in the last 72 hours. Urine analysis:    Component Value Date/Time   COLORURINE AMBER (A) 01/19/2018 2058   APPEARANCEUR HAZY (A) 01/19/2018 2058   LABSPEC 1.013 01/19/2018 2058   PHURINE 6.0 01/19/2018 2058   GLUCOSEU NEGATIVE 01/19/2018 2058   HGBUR NEGATIVE 01/19/2018 2058   BILIRUBINUR NEGATIVE 01/19/2018 2058   KETONESUR NEGATIVE 01/19/2018 2058   PROTEINUR NEGATIVE 01/19/2018 2058   NITRITE NEGATIVE 01/19/2018 2058   LEUKOCYTESUR NEGATIVE 01/19/2018 0454     Ripudeep Rai M.D. [facility] 01/30/2018, 12:28 PM  Pager: [phone] Between 7am to 7pm - call Pager - [phone]  After 7pm go to [email or web address] - password [email or web address]  Call night coverage person covering after 7pm

## 2018-01-30 NOTE — Plan of Care (Signed)
  Progressing Education: Knowledge of General Education information will improve 01/30/2018 0211 - Progressing by Ruel FavorsBrown-Staples, Marche Hottenstein, RN Health Behavior/Discharge Planning: Ability to manage health-related needs will improve 01/30/2018 0211 - Progressing by Ruel FavorsBrown-Staples, Giani Betzold, RN Clinical Measurements: Ability to maintain clinical measurements within normal limits will improve 01/30/2018 0211 - Progressing by Ruel FavorsBrown-Staples, Shabria Egley, RN Will remain free from infection 01/30/2018 0211 - Progressing by Ruel FavorsBrown-Staples, Makaylen Thieme, RN Diagnostic test results will improve 01/30/2018 0211 - Progressing by Ruel FavorsBrown-Staples, Trine Fread, RN Respiratory complications will improve 01/30/2018 0211 - Progressing by Ruel FavorsBrown-Staples, Alaysiah Browder, RN Cardiovascular complication will be avoided 01/30/2018 0211 - Progressing by Ruel FavorsBrown-Staples, Orbin Mayeux, RN Activity: Risk for activity intolerance will decrease 01/30/2018 0211 - Progressing by Ruel FavorsBrown-Staples, Lucresha Dismuke, RN Nutrition: Adequate nutrition will be maintained 01/30/2018 0211 - Progressing by Ruel FavorsBrown-Staples, Bhavik Cabiness, RN Coping: Level of anxiety will decrease 01/30/2018 0211 - Progressing by Ruel FavorsBrown-Staples, Jansel Vonstein, RN Elimination: Will not experience complications related to bowel motility 01/30/2018 0211 - Progressing by Ruel FavorsBrown-Staples, Yogesh Cominsky, RN Will not experience complications related to urinary retention 01/30/2018 0211 - Progressing by Ruel FavorsBrown-Staples, Gonsalo Cuthbertson, RN Pain Managment: General experience of comfort will improve 01/30/2018 0211 - Progressing by Ruel FavorsBrown-Staples, Forrest Jaroszewski, RN Safety: Ability to remain free from injury will improve Description Bed in low position. Call bell within reach.  01/30/2018 0211 - Progressing by Ruel FavorsBrown-Staples, Shivani Barrantes, RN Skin Integrity: Risk for impaired skin integrity will decrease 01/30/2018 0211 - Progressing by Ruel FavorsBrown-Staples, Hailyn Zarr, RN

## 2018-01-30 NOTE — Discharge Summary (Addendum)
Physician Discharge Summary   Patient ID: Debbie Golden MRN: [id number] DOB/AGE: [birth year] 47 y.o.  Admit date: 01/20/2018 Discharge date: 02/01/2018  Primary Care Physician:  Debbie Golden  Discharge Diagnoses:   Acute metabolic encephalopathy . Leg weakness, bilateral . Obesity with history of gastric bypass . Bipolar disorder (HCC) . Macrocytic anemia . Acute hypokalemia . Alcohol use   Consults:  Neurology Psychiatry  Recommendations for Outpatient Follow-up:  1. PT evaluation recommended skilled nursing facility 2. Please repeat CBC/BMET at next visit 3. Follow TSH, free T4 in 4-6 weeks   DIET: Heart healthy diet  Allergies:  No Known Allergies   DISCHARGE MEDICATIONS: Allergies as of 02/01/2018   No Known Allergies     Medication List    TAKE these medications   acetaminophen 325 MG tablet Commonly known as:  TYLENOL Take 2 tablets (650 mg total) by mouth every 6 (six) hours as needed for mild pain (or Fever >/= 101).   chlordiazePOXIDE 5 MG capsule Commonly known as:  LIBRIUM Take Librium 1 tab (5mg ) daily for 3 days, then 1/2 tab (2.5mg ) for 3 days then OFF.   folic acid 1 MG tablet Commonly known as:  FOLVITE Take 1 tablet (1 mg total) by mouth daily.   lamoTRIgine 100 MG tablet Commonly known as:  LAMICTAL Take 100 mg by mouth daily.   multivitamin with minerals Tabs tablet Take 1 tablet by mouth daily.   pyridOXINE 100 MG tablet Commonly known as:  VITAMIN B-6 Take 1 tablet (100 mg total) by mouth daily.   risperiDONE 2 MG tablet Commonly known as:  RISPERDAL Take 1 tablet (2 mg total) by mouth at bedtime. What changed:    medication strength  how much to take   risperiDONE 0.5 MG tablet Commonly known as:  RISPERDAL Take 1 tablet (0.5 mg total) by mouth 2 (two) times daily as needed (hallucinations). What changed:  You were already taking a medication with the same name, and this prescription was added. Make sure you understand  how and when to take each.   thiamine 100 MG tablet Take 1 tablet (100 mg total) by mouth daily.   traMADol 50 MG tablet Commonly known as:  ULTRAM Take 1 tablet (50 mg total) by mouth every 8 (eight) hours as needed for moderate pain or severe pain.        Brief H and P: For complete details please refer to admission H and P, but in brief HPI on 01/20/2018 by [name], NP Debbie Golden a 47 y.o.femalewith medical history significant forbipolar disorder with history of catatonia in 2017, obesity, macrocytic anemia, history of genital herpes, and regular alcohol use. Patient presented to [facility] the afternoon of 1/22 reporting pain and numbness in both legs greater in the left leg ongoing for a week. She reports chronic back problems and had a steroid injection 1 week prior. She reported increased difficulty with ambulation at home and no recent trauma. When I questioned the patient she reports she has been having difficulty ambulating and weakness/numbness/tingling in the legs waxing and waning for 1 month. CT of the head was unremarkable. Due to concerns of possible Guillain-Barr patient was subsequently transferred to this facility so she can undergo a lumbar puncture. After arrival to [facility] she was evaluated by neurology who documented on exam patient had absent generalized DTRs, mild bilateral lower extremity motor weakness averaging 4/5 from the hips down with decreased wrist flexion, extension, abduction bilaterally at 4/5 as well.  She was not exhibiting any respiratory symptoms or signs of bowel or bladder incontinence or urinary retention. MRI of the cervical and thoracic spine did not reveal explanation for patient's current symptoms. Subsequent review of CSF is not consistent with Debbie Golden.Patient was admitted for further workup   Hospital Course:   Acute metabolic encephalopathy with underlying alcohol use, questionable withdrawal -Currently alert and  oriented x3,  - patient was placed on CIWA call with Ativan however agitation appears to have worsened with Ativan administration. PCCM was consulted and patient was transferred to ICU for Precedex administration -MRI brain showed no acute intracranial pathology, mental status has been improving - psychiatry was also consulted and recommended Risperdal 0.5 mg BID PRN for hallucinations -Librium tapered to 5 mg once a day for 3 days, then 2.5 mg daily for 3 days then off  Active Problems:   Leg weakness, bilateral -Neurology was consulted initially suspecting possible Guillain-Barr syndrome. -MRI cervical, thoracic spine all normal.  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  Continue Lamictal, Risperdal    Macrocytic anemia -Possibly due to alcohol use, AST > ALT and folate deficiency -B12 401, folate low 2.4, placed on folic acid daily -Vitamin B6 level low, 1.8, patient was placed on IV pyridoxine, transition to oral pyridoxine 100 mg daily at discharge   Abnormal TSH TSH 4.7, free T4 1.15, recheck thyroid function outpatient in 4-6 weeks    Day of Discharge BP 130/89 (BP Location: Left Arm)   Pulse 98   Temp 98.9 F (37.2 C) (Oral)   Resp 13   Ht 5\' 7"  (1.702 m)   Wt 106.1 kg (234 lb) Comment: Golden pt, bed was not zerod out!  SpO2 98%   BMI 36.65 kg/m   Physical  Exam: General: Alert and awake oriented x3 not in any acute distress. HEENT: anicteric sclera, pupils reactive to light and accommodation CVS: S1-S2 clear no murmur rubs or gallops Chest: clear to auscultation bilaterally, no wheezing rales or rhonchi Abdomen: soft nontender, nondistended, normal bowel sounds Extremities: no cyanosis, clubbing or edema noted bilaterally Neuro: strength 4/5 both lower extremities, able to lift them up   The results of significant diagnostics from this hospitalization (including imaging, microbiology, ancillary and laboratory) are listed below for reference.    LAB RESULTS: Basic Metabolic Panel: Recent Labs  Lab 01/26/18 0233 01/27/18 0617  NA 136 136  K 3.3* 3.7  CL 108 108  CO2 22 22  GLUCOSE 95 88  BUN 5* 6  CREATININE 0.65 0.59  CALCIUM 8.0* 8.2*  MG  --  2.1  PHOS  --  2.8   Liver Function Tests: No results for input(s): AST, ALT, ALKPHOS, BILITOT, PROT, ALBUMIN in the last 168 hours. No results for input(s): LIPASE, AMYLASE in the last 168 hours. No results for input(s): AMMONIA in the last 168 hours. CBC: Recent Labs  Lab 01/26/18 0233 01/27/18 0617  WBC 4.5 5.9  HGB 8.5* 8.5*  HCT 25.8*  25.6*  MCV 124.6* 123.7*  PLT 308 391   Cardiac Enzymes: No results for input(s): CKTOTAL, CKMB, CKMBINDEX, TROPONINI in the last 168 hours. BNP: Invalid input(s): POCBNP CBG: No results for input(s): GLUCAP in the last 168 hours.  Significant Diagnostic Studies:  Mr Cervical Spine Wo Contrast  Result Date: 01/20/2018 CLINICAL DATA:  Initial evaluation for progressive ascending weakness and numbness. EXAM: MRI CERVICAL, THORACIC SPINE WITHOUT CONTRAST TECHNIQUE: Multiplanar and multiecho pulse sequences of the cervical spine, to include the craniocervical junction and cervicothoracic junction, and thoracic and lumbar spine, were obtained without intravenous contrast. COMPARISON:  None available. FINDINGS: MRI CERVICAL SPINE FINDINGS  Alignment: Study moderately degraded by motion artifact. Straightening with slight reversal of the normal cervical lordosis, apex at C5. No listhesis. Vertebrae: Vertebral body heights are maintained without evidence for acute or chronic fracture. Bone marrow signal intensity within normal limits. No discrete or worrisome osseous lesions. No abnormal marrow edema. Cord: Signal intensity within cervical spinal cord within normal limits. No appreciable cord signal abnormality identified on this motion degraded exam. Posterior Fossa, vertebral arteries, paraspinal tissues: Visualized brain within normal limits. Craniocervical junction normal. Scattered mucosal thickening noted with in the visualized paranasal sinuses. Paraspinous and prevertebral soft tissues within normal limits. Normal intravascular flow voids present within the vertebral arteries bilaterally. Disc levels: C2-C3: Unremarkable. C3-C4:  Unremarkable. C4-C5: Mild diffuse disc bulge with bilateral uncovertebral hypertrophy. Resultant mild right C5 foraminal stenosis. No significant canal narrowing. C5-C6: Chronic diffuse degenerative disc osteophyte with intervertebral disc space narrowing. Broad posterior component mildly flattens the ventral thecal sac without significant spinal stenosis. Moderate bilateral C6 foraminal stenosis, slightly worse on the right. C6-C7: Mild diffuse chronic disc osteophyte with intervertebral disc space narrowing. Flattening of the ventral thecal sac without significant spinal stenosis. Mild bilateral C7 foraminal stenosis. C7-T1: Mild facet hypertrophy. Otherwise unremarkable without stenosis. MRI THORACIC SPINE FINDINGS Alignment: Vertebral bodies normally aligned with preservation of the normal thoracic kyphosis. No listhesis. Vertebrae: Vertebral body heights are maintained without evidence for acute or chronic fracture. Bone marrow signal intensity within normal limits. No discrete or worrisome osseous lesions. No  abnormal marrow edema. Cord: Signal intensity within the thoracic spinal cord within normal limits. No cord signal abnormality seen on this motion degraded study. Conus medullaris terminates at the L1 level. Overall cord caliber normal. Paraspinal and other soft tissues: Paraspinous soft tissues within normal limits. Partially visualized lungs are grossly clear. Visualized visceral structures are unremarkable. Disc levels: T7-8: Mild disc bulge without significant stenosis. L1-2: Seen only on sagittal projection. Disc bulge without significant canal stenosis. No foraminal encroachment. No other significant degenerative changes seen within the thoracic spine. No significant canal or neural foraminal stenosis. IMPRESSION: MRI CERVICAL SPINE IMPRESSION: 1. Normal MRI appearance of the cervical spinal cord. 2. 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 Mild to moderate bilateral foraminal narrowing at these levels as above. MRI THORACIC SPINE IMPRESSION: 1. Normal MRI appearance of the thoracic spinal cord. 2. Minimal disc bulging at T7-8 without stenosis. No other significant degenerative changes within the thoracic spine. No significant stenosis. Electronically Signed   By: Rise MuBenjamin  McClintock M.D.   On: 01/20/2018 06:42   Mr Thoracic Spine Wo Contrast  Result Date: 01/20/2018 CLINICAL DATA:  Initial evaluation for progressive ascending weakness and numbness. EXAM: MRI CERVICAL, THORACIC SPINE WITHOUT CONTRAST TECHNIQUE: Multiplanar and multiecho pulse sequences of the cervical spine, to include the craniocervical junction and cervicothoracic junction, and thoracic and lumbar spine, were  obtained without intravenous contrast. COMPARISON:  None available. FINDINGS: MRI CERVICAL SPINE FINDINGS Alignment: Study moderately degraded by motion artifact. Straightening with slight reversal of the normal cervical lordosis, apex at C5. No listhesis. Vertebrae: Vertebral body  heights are maintained without evidence for acute or chronic fracture. Bone marrow signal intensity within normal limits. No discrete or worrisome osseous lesions. No abnormal marrow edema. Cord: Signal intensity within cervical spinal cord within normal limits. No appreciable cord signal abnormality identified on this motion degraded exam. Posterior Fossa, vertebral arteries, paraspinal tissues: Visualized brain within normal limits. Craniocervical junction normal. Scattered mucosal thickening noted with in the visualized paranasal sinuses. Paraspinous and prevertebral soft tissues within normal limits. Normal intravascular flow voids present within the vertebral arteries bilaterally. Disc levels: C2-C3: Unremarkable. C3-C4:  Unremarkable. C4-C5: Mild diffuse disc bulge with bilateral uncovertebral hypertrophy. Resultant mild right C5 foraminal stenosis. No significant canal narrowing. C5-C6: Chronic diffuse degenerative disc osteophyte with intervertebral disc space narrowing. Broad posterior component mildly flattens the ventral thecal sac without significant spinal stenosis. Moderate bilateral C6 foraminal stenosis, slightly worse on the right. C6-C7: Mild diffuse chronic disc osteophyte with intervertebral disc space narrowing. Flattening of the ventral thecal sac without significant spinal stenosis. Mild bilateral C7 foraminal stenosis. C7-T1: Mild facet hypertrophy. Otherwise unremarkable without stenosis. MRI THORACIC SPINE FINDINGS Alignment: Vertebral bodies normally aligned with preservation of the normal thoracic kyphosis. No listhesis. Vertebrae: Vertebral body heights are maintained without evidence for acute or chronic fracture. Bone marrow signal intensity within normal limits. No discrete or worrisome osseous lesions. No abnormal marrow edema. Cord: Signal intensity within the thoracic spinal cord within normal limits. No cord signal abnormality seen on this motion degraded study. Conus medullaris  terminates at the L1 level. Overall cord caliber normal. Paraspinal and other soft tissues: Paraspinous soft tissues within normal limits. Partially visualized lungs are grossly clear. Visualized visceral structures are unremarkable. Disc levels: T7-8: Mild disc bulge without significant stenosis. L1-2: Seen only on sagittal projection. Disc bulge without significant canal stenosis. No foraminal encroachment. No other significant degenerative changes seen within the thoracic spine. No significant canal or neural foraminal stenosis. IMPRESSION: MRI CERVICAL SPINE IMPRESSION: 1. Normal MRI appearance of the cervical spinal cord. 2. 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 Mild to moderate bilateral foraminal narrowing at these levels as above. MRI THORACIC SPINE IMPRESSION: 1. Normal MRI appearance of the thoracic spinal cord. 2. Minimal disc bulging at T7-8 without stenosis. No other significant degenerative changes within the thoracic spine. No significant stenosis. Electronically Signed   By: Rise Mu M.D.   On: 01/20/2018 06:42   Mr Lumbar Spine W Wo Contrast  Result Date: 01/20/2018 CLINICAL DATA:  47 year old female with progressive ascending weakness and numbness. EXAM: MRI LUMBAR SPINE WITHOUT AND WITH CONTRAST TECHNIQUE: Multiplanar and multiecho pulse sequences of the lumbar spine were obtained without and with intravenous contrast. CONTRAST:  20mL MULTIHANCE GADOBENATE DIMEGLUMINE 529 MG/ML IV SOLN COMPARISON:  Cervical and thoracic spine MRI 0534 hr today. Lumbar puncture images 0 347 hr today. FINDINGS: Segmentation: Normal lumbar segmentation demonstrated on the LP images. This numbering appears concordant with the cervical and thoracic spine numbering earlier today. Alignment:  Preserved lumbar lordosis. Vertebrae: Chronic degenerative endplate marrow signal changes at L5-S1. Elsewhere Visualized bone marrow signal is within normal limits. No  marrow edema or evidence of acute osseous abnormality. Intact visible sacrum and SI joints. Conus medullaris and cauda equina: Conus extends to the T12-L1 level. Normal conus.  No abnormal intradural enhancement. No thickening of the cauda equina, nerve roots appear normal. Paraspinal and other soft tissues: Negative visualized abdominal viscera. Posterior paraspinal soft tissues are within normal limits; there is a degree of diffuse paraspinal muscle atrophy. Disc levels: T12-L1:  Negative. L1-L2: Small central to slightly left paracentral disc herniation (series 6, image 10). No associated stenosis. L2-L3:  Mild facet hypertrophy.  No stenosis. L3-L4:  Mild facet hypertrophy.  No stenosis. L4-L5:  Mild facet hypertrophy.  No stenosis. L5-S1: Advanced disc space loss. Probable vacuum disc. Circumferential disc osteophyte complex with broad-based posterior component. Mild facet hypertrophy. Mild epidural lipomatosis. No significant spinal stenosis or convincing lateral recess stenosis. There is moderate left and mild right L5 neural foraminal stenosis. IMPRESSION: 1. Normal lower thoracic spinal cord and cauda equina. No evidence of intradural inflammation. 2. Advanced chronic lumbar disc degeneration at L5-S1. No associated spinal stenosis, but mild to moderate L5 neural foraminal stenosis greater on the left. 3. Small lumbar disc herniation at L1-L2 without convincing neural impingement. 4.  No acute osseous abnormality. Electronically Signed   By: Odessa Fleming M.D.   On: 01/20/2018 11:46   Dg Fluoro Guide Lumbar Puncture  Result Date: 01/20/2018 CLINICAL DATA:  Numbness in the legs and arms. EXAM: DIAGNOSTIC LUMBAR PUNCTURE UNDER FLUOROSCOPIC GUIDANCE FLUOROSCOPY TIME:  Fluoroscopy Time:  1 minute 0 seconds. Radiation Exposure Index (if provided by the fluoroscopic device): Dose area product is 270 micro Gy meter square. Reference air kerma is 21 mGy Number of Acquired Spot Images: 1 PROCEDURE: Informed consent was  obtained from the patient prior to the procedure, including potential complications of headache, allergy, and pain. With the patient prone, the lower back was prepped with Betadine. 1% Lidocaine was used for local anesthesia. Lumbar puncture was performed at the L4-5 level using a 20 gauge needle with return of clear CSF. 5 ml of CSF were obtained for laboratory studies. During collection for the third tube, the patient moved, dislodging the needle and the procedure was ended. The last of the fluid became blood-tinged after the needle was dislodged. Opening pressure was not obtained as the patient was unable to tolerate positioning for opening pressure. Subjectively, the pressure was not elevated. The patient tolerated the procedure well and there were no apparent complications. Patient complained of loss of feeling in her legs both before and after the procedure without significant change. IMPRESSION: Lumbar puncture performed under fluoroscopy at the L4-5 level. 5 mL clear CSF was obtained. Patient tolerated the procedure well without complication. Electronically Signed   By: Burman Nieves M.D.   On: 01/20/2018 04:21    2D ECHO:   Disposition and Follow-up: Discharge Instructions    Ambulatory referral to Neurology   Complete by:  As directed    An appointment is requested in approximately: 4 Week(s): treated for GBS, b/l LE weakness during hospitalization   Diet general   Complete by:  As directed    Increase activity slowly   Complete by:  As directed        DISPOSITION: SNF   DISCHARGE FOLLOW-UP  Contact information for follow-up providers    [facility]. Schedule an appointment as soon as possible for a visit in 4 week(s).   Why:  PLEASE CALL TO ARRANGE Contact information: [address]     [address] [place] [address] [phone]       [facility]. Call.   Why:  physician referral line for primary care doctor needs-  call for  list of doctors currently taking new patients Contact information: [phone]           Contact information for after-discharge care    Destination    [facility] Follow up.   Service:  Skilled Nursing Contact information: [address] [place] [address] [phone]                  Time spent on Discharge:   Signed:  Costin M. Elvera Lennox, MD [facility] [phone]  If 7PM-7AM, please contact night-coverage [email or web address] Password [email or web address]

## 2018-01-30 NOTE — Progress Notes (Signed)
Pt called staff into room for assistance. Pt was found at bottom of bed, on footboard. Pt stated that she slid down while in a chair position in the bed. Assessment completed by staff and was within pt's normal limits. Pt assisted to chair so bed could be made. Pt then assisted back to bed, with call light within reach. Pt denies needs. Night shift RN made aware.   Berdine DanceLauren Moffitt BSN, RN

## 2018-01-30 NOTE — Plan of Care (Signed)
  Progressing Education: Knowledge of General Education information will improve 01/30/2018 2216 - Progressing by Ruel FavorsBrown-Staples, Avinash Maltos, RN Clinical Measurements: Cardiovascular complication will be avoided 01/30/2018 2216 - Progressing by Ruel FavorsBrown-Staples, Ileene Allie, RN

## 2018-01-30 NOTE — Progress Notes (Signed)
No authorization received.   Osborne Cascoadia Shantara Goosby LCSW [phone]

## 2018-01-31 DIAGNOSIS — G378 Other specified demyelinating diseases of central nervous system: Secondary | ICD-10-CM

## 2018-01-31 NOTE — Progress Notes (Signed)
PROGRESS NOTE  Debbie Golden ZOX:[id number] DOB: June 27, 1971 DOA: 01/20/2018 PCP: Patient, No Pcp Per   LOS: 11 days   Brief Narrative / Interim history: Debbie Golden a 47 y.o.femalewith medical history significant forbipolar disorder with history of catatonia in 2017, obesity, macrocytic anemia, history of genital herpes, and regular alcohol use. Patient presented to [facility] the afternoon of 1/22 reporting pain and numbness in both legs greater in the left leg ongoing for a week. She reports chronic back problems and had a steroid injection 1 week prior. She reported increased difficulty with ambulation at home and no recent trauma. When I questioned the patient she reports she has been having difficulty ambulating and weakness/numbness/tingling in the legs waxing and waning for 1 month. CT of the head was unremarkable. Due to concerns of possible Guillain-Barr patient was subsequently transferred to this facility so she can undergo a lumbar puncture. After arrival to [facility] she was evaluated by neurology who documented on exam patient had absent generalized DTRs, mild bilateral lower extremity motor weakness averaging 4/5 from the hips down with decreased wrist flexion, extension, abduction bilaterally at 4/5 as well. She was not exhibiting any respiratory symptoms or signs of bowel or bladder incontinence or urinary retention. MRI of the cervical and thoracic spine did not reveal explanation for patient's current symptoms. Subsequent review of CSF is not consistent with Guillian-Barr.  Assessment & Plan: Principal Problem:   Altered mental status Active Problems:   Leg weakness, bilateral   Obesity   Bipolar disorder (HCC)   Macrocytic anemia   Acute hypokalemia   Alcohol use   Acute encephalopathy   Alcohol withdrawal syndrome, with delirium (HCC)   Acute metabolic encephalopathy with underlying alcohol use, questionable withdrawal -Currently alert and oriented x3,  -patient  was placed on CIWA call with Ativan however agitation appears to have worsened with Ativan administration. PCCM was consulted and patient was transferred to ICU for Precedex administration -MRI brain showed no acute intracranial pathology, mental status has been improving - psychiatry was also consulted and recommended Risperdal 0.5 mg BID PRN for hallucinations -Will taper Librium to 5 mg daily from 2/3, for 3 days then taper to 2.5 mg daily starting 2/6, for 3 days then off -Pending skilled nursing facility authorization otherwise medically stable  Leg weakness, bilateral -Neurology was consulted initially suspecting possible Guillain-Barr syndrome. 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 -MRI cervical, thoracic spine normal.  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, no associated spinal stenosis, small lumbar disc herniation at L1-2 without convincing neural impinge -Patient underwent spinal tap, no pleocytosis, protein was not elevated, neurology recommended 5 days IVIG, finished 1/29 -Currently awaiting skilled nursing facility bed  Obesity, history of gastric bypass -BMI 35.0 -Counseled on diet and weight control  Bipolar disorder (HCC) -Continue Lamictal, Risperdal  Macrocytic anemia -Possibly due to alcohol use, AST > ALT and folate deficiency -B12 401, folate low 2.4, placed on folic acid daily -Vitamin B6 level low, 1.8, currently on IV pyridoxine, transition to oral pyridoxine 100 mg daily at discharge   Abnormal TSH -TSH 4.7, free T4 1.15, recheck thyroid function outpatient in 4-6 weeks  DVT prophylaxis: lovenox Code Status: Full code Family Communication: no family at bedside Disposition Plan: SNF when authorization available.   Consultants:   Neurology   Psychiatry    Procedures:   LP  Antimicrobials:  None    Subjective: - no chest pain, shortness of breath, no abdominal pain, nausea or vomiting.   Objective: Vitals:   01/30/18 0423 01/30/18 1431 01/30/18 1953 01/31/18 0315  BP: (!) 120/91 115/78 (!) 124/91   Pulse: (!) 109 90 (!) 106   Resp: (!) 29 (!) 24 14   Temp: 98.1 F (36.7 C) 98 F (36.7 C) 98.5 F (36.9 C) 98.4 F (36.9 C)  TempSrc: Oral Oral Oral Oral  SpO2: 99%  100%   Weight: 106.1 kg (234 lb)     Height:        Intake/Output Summary (Last 24 hours) at 01/31/2018 1312 Last data filed at 01/31/2018 0646 Gross per 24 hour  Intake 600 ml  Output 550 ml  Net 50 ml   Filed Weights   01/27/18 0421 01/30/18 0423  Weight: 101.6 kg (224 lb) 106.1 kg (234 lb)    Examination:  Constitutional: NAD Respiratory: CTA Cardiovascular: RRR Neurologic: Equal strength, generalized LE weakness   Data Reviewed: I have independently reviewed following labs and imaging studies   CBC: Recent Labs  Lab 01/25/18 0244 01/26/18 0233 01/27/18 0617  WBC 5.1 4.5 5.9  HGB 8.3* 8.5* 8.5*  HCT 25.5* 25.8* 25.6*  MCV 124.4* 124.6* 123.7*  PLT 287 308 391   Basic Metabolic Panel: Recent Labs  Lab 01/25/18 0244 01/26/18 0233 01/27/18 0617  NA 133* 136 136  K 3.7 3.3* 3.7  CL 104 108 108  CO2 20* 22 22  GLUCOSE 88 95 88  BUN <5* 5* 6  CREATININE 0.68 0.65 0.59  CALCIUM 8.3* 8.0* 8.2*  MG 1.9  --  2.1  PHOS 3.5  --  2.8   GFR: Estimated Creatinine Clearance: 109 mL/min (by C-G formula based on SCr of 0.59 mg/dL). Liver Function Tests: No results for input(s): AST, ALT, ALKPHOS, BILITOT, PROT, ALBUMIN in the last 168 hours. No results for input(s): LIPASE, AMYLASE in the last 168 hours. No results for input(s): AMMONIA in the last 168 hours. Coagulation Profile: No results for input(s): INR, PROTIME in the last 168 hours. Cardiac Enzymes: No results for input(s): CKTOTAL, CKMB, CKMBINDEX, TROPONINI in the last 168  hours. BNP (last 3 results) No results for input(s): PROBNP in the last 8760 hours. HbA1C: No results for input(s): HGBA1C in the last 72 hours. CBG: No results for input(s): GLUCAP in the last 168 hours. Lipid Profile: No results for input(s): CHOL, HDL, LDLCALC, TRIG, CHOLHDL, LDLDIRECT in the last 72 hours. Thyroid Function Tests: No results for input(s): TSH, T4TOTAL, FREET4, T3FREE, THYROIDAB in the last 72 hours. Anemia Panel: No results for input(s): VITAMINB12, FOLATE, FERRITIN, TIBC, IRON, RETICCTPCT in the last 72 hours. Urine analysis:    Component Value Date/Time   COLORURINE AMBER (A) 01/19/2018 2058   APPEARANCEUR HAZY (A) 01/19/2018 2058   LABSPEC 1.013 01/19/2018 2058   PHURINE 6.0 01/19/2018 2058   GLUCOSEU NEGATIVE 01/19/2018 2058   HGBUR NEGATIVE 01/19/2018 2058   BILIRUBINUR NEGATIVE 01/19/2018 2058   KETONESUR NEGATIVE 01/19/2018 2058   PROTEINUR NEGATIVE 01/19/2018 2058   NITRITE NEGATIVE 01/19/2018 2058   LEUKOCYTESUR NEGATIVE 01/19/2018 2058   Sepsis Labs: Invalid input(s): PROCALCITONIN, LACTICIDVEN  No results found for this or any previous visit (from the past 240 hour(s)).    Radiology Studies: No results found.   Scheduled Meds: . aspirin EC  325 mg Oral Daily  . chlordiazePOXIDE  5 mg Oral Daily  . enoxaparin (LOVENOX) injection  40 mg  Subcutaneous Q24H  . folic acid  1 mg Oral Daily  . lamoTRIgine  100 mg Oral Daily  . multivitamin with minerals  1 tablet Oral Daily  . pyridOXINE  100 mg Intravenous Daily  . risperiDONE  2 mg Oral QHS  . thiamine  100 mg Oral Daily   Continuous Infusions:   Pamella Pert, MD, PhD [facility] Pager [phone]  If 7PM-7AM, please contact night-coverage [email or web address] Password [email or web address] 01/31/2018, 1:12 PM

## 2018-01-31 NOTE — Plan of Care (Signed)
  Progressing Education: Knowledge of General Education information will improve 01/31/2018 2047 - Progressing by Ruel FavorsBrown-Staples, Avanni Turnbaugh, RN Health Behavior/Discharge Planning: Ability to manage health-related needs will improve 01/31/2018 2047 - Progressing by Ruel FavorsBrown-Staples, Kayal Mula, RN Clinical Measurements: Ability to maintain clinical measurements within normal limits will improve 01/31/2018 2047 - Progressing by Ruel FavorsBrown-Staples, Tayte Childers, RN Will remain free from infection 01/31/2018 2047 - Progressing by Ruel FavorsBrown-Staples, Jerol Rufener, RN Diagnostic test results will improve 01/31/2018 2047 - Progressing by Ruel FavorsBrown-Staples, Valisha Heslin, RN Respiratory complications will improve 01/31/2018 2047 - Progressing by Ruel FavorsBrown-Staples, Amillion Macchia, RN Cardiovascular complication will be avoided 01/31/2018 2047 - Progressing by Ruel FavorsBrown-Staples, Benard Minturn, RN Activity: Risk for activity intolerance will decrease 01/31/2018 2047 - Progressing by Ruel FavorsBrown-Staples, Jashon Ishida, RN Nutrition: Adequate nutrition will be maintained 01/31/2018 2047 - Progressing by Ruel FavorsBrown-Staples, Mashal Slavick, RN Coping: Level of anxiety will decrease 01/31/2018 2047 - Progressing by Ruel FavorsBrown-Staples, Inman Fettig, RN Elimination: Will not experience complications related to bowel motility 01/31/2018 2047 - Progressing by Ruel FavorsBrown-Staples, Javis Abboud, RN Will not experience complications related to urinary retention 01/31/2018 2047 - Progressing by Ruel FavorsBrown-Staples, Masiyah Engen, RN Pain Managment: General experience of comfort will improve 01/31/2018 2047 - Progressing by Ruel FavorsBrown-Staples, Braxxton Stoudt, RN Safety: Ability to remain free from injury will improve Description Bed in low position. Call bell within reach.  01/31/2018 2047 - Progressing by Ruel FavorsBrown-Staples, Denzal Meir, RN Skin Integrity: Risk for impaired skin integrity will decrease 01/31/2018 2047 - Progressing by Ruel FavorsBrown-Staples, Kristina Bertone, RN

## 2018-02-01 NOTE — Progress Notes (Signed)
Pt discharging to SNF, [facility] [place].  D/C instructions reviewed with pt and given a copy of instructions, pt unable to sign due to disability with hands.  Copy of instructions printed and sent with transporters for facility.  Pt d/c'd with belongings, transported by [facility].

## 2018-02-01 NOTE — Progress Notes (Signed)
Clinical Social Worker facilitated patient discharge including contacting patient family and facility to confirm patient discharge plans.  Clinical information faxed to facility and family agreeable with plan.  CSW arranged ambulance transport via [facility] to [facility] in [place]  .  RN to call [phone] (pt will go in room 505) for report prior to discharge.  Clinical Social Worker will sign off for now as social work intervention is no longer needed. Please consult us again if new need arises.  Marrianne MoodAshley Tyden Kann, MSW, [employer]CSWA [phone]

## 2018-02-01 NOTE — Clinical Social Work Placement (Signed)
   CLINICAL SOCIAL WORK PLACEMENT  NOTE  Date:  02/01/2018  Patient Details  Name: Debbie Golden MRN: [id number] Date of Birth: 02-May-1971  Clinical Social Work is seeking post-discharge placement for this patient at the Skilled  Nursing Facility level of care (*CSW will initial, date and re-position this form in  chart as items are completed):      Patient/family provided with [facility] Clinical Social Work Department's list of facilities offering this level of care within the geographic area requested by the patient (or if unable, by the patient's family).  Yes   Patient/family informed of their freedom to choose among providers that offer the needed level of care, that participate in Medicare, Medicaid or managed care program needed by the patient, have an available bed and are willing to accept the patient.      Patient/family informed of [facility]'s ownership interest in [facility] and [facility], as well as of the fact that they are under no obligation to receive care at these facilities.  PASRR submitted to EDS on       PASRR number received on 01/22/18     Existing PASRR number confirmed on 02/01/18     FL2 transmitted to all facilities in geographic area requested by pt/family on 01/22/18     FL2 transmitted to all facilities within larger geographic area on       Patient informed that his/her managed care company has contracts with or will negotiate with certain facilities, including the following:        Yes   Patient/family informed of bed offers received.  [facility]     Physician recommends and patient chooses bed at      Patient to be transferred to [facility] [place] on 02/01/18.  Patient to be transferred to facility by ptar     Patient family notified on 02/01/18 of transfer.  Name of family member notified:  left voicemail for pt fiancee      PHYSICIAN       Additional Comment:     _______________________________________________ Althea CharonAshley C Ronesha Heenan, LCSW 02/01/2018, 12:10 PM

## 2018-02-23 ENCOUNTER — Encounter: Payer: Self-pay | Admitting: Neurology

## 2018-02-23 ENCOUNTER — Ambulatory Visit (INDEPENDENT_AMBULATORY_CARE_PROVIDER_SITE_OTHER): Payer: BLUE CROSS/BLUE SHIELD | Admitting: Neurology

## 2018-02-23 VITALS — BP 123/83 | HR 85

## 2018-02-23 DIAGNOSIS — R269 Unspecified abnormalities of gait and mobility: Secondary | ICD-10-CM | POA: Diagnosis not present

## 2018-02-23 DIAGNOSIS — R531 Weakness: Secondary | ICD-10-CM | POA: Diagnosis not present

## 2018-02-23 DIAGNOSIS — R202 Paresthesia of skin: Secondary | ICD-10-CM | POA: Diagnosis not present

## 2018-02-23 MED ORDER — DULOXETINE HCL 60 MG PO CPEP: 60.0000 mg | ORAL_CAPSULE | Freq: Every day | ORAL | 12 refills | Status: DC

## 2018-02-23 NOTE — Progress Notes (Signed)
PATIENT: Debbie Golden DOB: 03-12-1971  Chief Complaint  Patient presents with  . Extremity Weakness    She is here with her fiance, Alinda Money.  [facility] is [facility] [place].  Reports having lower extremity weakness mid-January that later progressed to upper extremity weakness.   She also has numbness/tingling in her hands and feet.  She was admitted to the hospital and underwent extensive evaluation.  Since her discharge, her symptoms have continued to progressively get worse.  She is having to use a wheelchair because she is unable to stand without assistance.  Marland Kitchen PCP    No established PCP.  Referred from hospital.     HISTORICAL  Debbie Golden is a 47 year old female, seen in refer by primary care doctor Rai, Ripudeep for evaluation of extremity weakness, she is accompanied by her fianc Alinda Money at today's visit, initial evaluation was on February 23, 2018.  In January 2019, she began to have left knee pain, has difficulty bearing weight with her left leg, was seen by orthopedic surgeon, received injection, brace, instead of getting better, her gait abnormality become worse, by January 12, 2018, she noticed increased gait abnormality, numbness tingling from both knee down to her feet, trouble with her balance, few weeks later she also noticed bilateral hands paresthesia, achy pain, to the point of crying.  She was admitted to the hospital on January 22 to [facility], later transferred to [facility], now at the rehabilitation.  Extensive laboratory evaluations in January 2019, normal or negative vitamin B1, magnesium, phosphate, BMP, CBC showed hemoglobin of 8.5, elevated MCV of 124, MCH was 41, CPK was normal at 62,  UDS was positive for benzodiazepine, mild elevated free T4 1.15, B6 was mildly decreased 1.8,Normal B12 401, folic acid, ferritin level was 341  TSH was mildly elevated 4.7  Spinal fluid testing showed elevated WBC 36, lymphocyte was 15%,  neutrophilia was 81%, but it was a traumatic tap, pink spinal fluid, with RBC of 13,300, CSF total protein was 30  MRI of the brain in January 2019 was normal,MRI of lumbar spine showed mild degenerative disc disease, no significant foraminal canal stenosis, MRI of the thoracic spine was normal  That she may have significant gait abnormality, difficulty bearing weight, complains could not feel the ground, she denies bowel and bladder incontinence,  REVIEW OF SYSTEMS: Full 14 system review of systems performed and notable only for swelling legs, itching, anemia, joint pain, swelling, numbness, weakness, depression, anxiety, decreased energy  ALLERGIES: No Known Allergies  HOME MEDICATIONS: Current Outpatient Medications  Medication Sig Dispense Refill  . acetaminophen (TYLENOL) 325 MG tablet Take 2 tablets (650 mg total) by mouth every 6 (six) hours as needed for mild pain (or Fever >/= 101).    . folic acid (FOLVITE) 1 MG tablet Take 1 tablet (1 mg total) by mouth daily. 30 tablet 3  . lamoTRIgine (LAMICTAL) 100 MG tablet Take 100 mg by mouth daily.    . Multiple Vitamin (MULTIVITAMIN WITH MINERALS) TABS tablet Take 1 tablet by mouth daily.    Marland Kitchen pyridOXINE (VITAMIN B-6) 100 MG tablet Take 1 tablet (100 mg total) by mouth daily. 30 tablet 1  . risperiDONE (RISPERDAL) 0.5 MG tablet Take 1 tablet (0.5 mg total) by mouth 2 (two) times daily as needed (hallucinations). 30 tablet 0  . risperiDONE (RISPERDAL) 2 MG tablet Take 1 tablet (2 mg total) by mouth at bedtime. 30 tablet 0  . thiamine 100 MG  tablet Take 1 tablet (100 mg total) by mouth daily. 30 tablet 1  . traMADol (ULTRAM) 50 MG tablet Take 1 tablet (50 mg total) by mouth every 8 (eight) hours as needed for moderate pain or severe pain. 10 tablet 0   No current facility-administered medications for this visit.     PAST MEDICAL HISTORY: Past Medical History:  Diagnosis Date  . Bipolar disorder (HCC)   . Depression   . Genital herpes    . Obesity   . Weakness     PAST SURGICAL HISTORY: Past Surgical History:  Procedure Laterality Date  . ANKLE FRACTURE SURGERY Right   . GASTRIC BYPASS N/A 2007    FAMILY HISTORY: Family History  Adopted: Yes    SOCIAL HISTORY:  Social History   Socioeconomic History  . Marital status: Single    Spouse name: Not on file  . Number of children: 0  . Years of education: 16  . Highest education level: Master's degree (e.g., MA, MS, MEng, MEd, MSW, MBA)  Social Needs  . Financial resource strain: Not on file  . Food insecurity - worry: Not on file  . Food insecurity - inability: Not on file  . Transportation needs - medical: Not on file  . Transportation needs - non-medical: Not on file  Occupational History  . Occupation: Designer, jewelleryMedical Billing  Tobacco Use  . Smoking status: Never Smoker  . Smokeless tobacco: Never Used  Substance and Sexual Activity  . Alcohol use: Yes    Comment: 3 drinks per week  . Drug use: No  . Sexual activity: Not on file  Other Topics Concern  . Not on file  Social History Narrative   Lives at home with her fiance, Alinda Moneyony.   Right-handed.   One cup caffeine per day.     PHYSICAL EXAM   Vitals:   02/23/18 1135  BP: 123/83  Pulse: 85    Not recorded      There is no height or weight on file to calculate BMI.  PHYSICAL EXAMNIATION:  Gen: NAD, conversant, well nourised, obese, well groomed                     Cardiovascular: Regular rate rhythm, no peripheral edema, warm, nontender. Eyes: Conjunctivae clear without exudates or hemorrhage Neck: Supple, no carotid bruits. Pulmonary: Clear to auscultation bilaterally   NEUROLOGICAL EXAM:  MENTAL STATUS: Speech:    Speech is normal; fluent and spontaneous with normal comprehension.  Cognition:     Orientation to time, place and person     Normal recent and remote memory     Normal Attention span and concentration     Normal Language, naming, repeating,spontaneous speech     Fund  of knowledge   CRANIAL NERVES: CN II: Visual fields are full to confrontation. Fundoscopic exam is normal with sharp discs and no vascular changes. Pupils are round equal and briskly reactive to light. CN III, IV, VI: extraocular movement are normal. No ptosis. CN V: Facial sensation is intact to pinprick in all 3 divisions bilaterally. Corneal responses are intact.  CN VII: Face is symmetric with normal eye closure and smile. CN VIII: Hearing is normal to rubbing fingers CN IX, X: Palate elevates symmetrically. Phonation is normal. CN XI: Head turning and shoulder shrug are intact CN XII: Tongue is midline with normal movements and no atrophy.  MOTOR: There is no pronator drift of out-stretched arms. Muscle bulk and tone are normal. Muscle strength is  normal.  REFLEXES: Reflexes are 2+ and symmetric at the biceps, triceps, knees, and trace at ankles. Plantar responses are flexor.  SENSORY: Intact to light touch, pinprick, positional sensation and vibratory sensation are intact in fingers and toes.  COORDINATION: Rapid alternating movements and fine finger movements are intact. There is no dysmetria on finger-to-nose and heel-knee-shin.    GAIT/STANCE: She needs assistance to get up from sitting position, variable effort, unsteady   DIAGNOSTIC DATA (LABS, IMAGING, TESTING) - I reviewed patient records, labs, notes, testing and imaging myself where available.   ASSESSMENT AND PLAN  Emilyanne Mcgough is a 47 y.o. female   Subacute onset progressive gait abnormality, Unsure etiology, extensive evaluation during her hospital stay, failed to localize the lesion She has variable effort on her examinations, Proceed with EMG nerve conduction study to rule out peripheral neuropathy, and myopathic changes, Complete laboratory evaluations   Levert Feinstein, M.D. Ph.D.  [facility] [address], [address] [place], [place] [address] Ph: [phone] Fax: [phone]  CC:  Cathren Harsh, MD

## 2018-02-24 ENCOUNTER — Encounter: Payer: Self-pay | Admitting: *Deleted

## 2018-02-24 ENCOUNTER — Telehealth: Payer: Self-pay | Admitting: Neurology

## 2018-02-24 NOTE — Telephone Encounter (Signed)
Please call patient, lab evaluations showed low Vit D level 9.9, she should start Vit D3 supplement, 1000 units, 4 tabs daily, will repeat level later.  There is also evaluated ESR and normal CRP, could indicate potential inflammatory process, will repeat lab later.

## 2018-02-24 NOTE — Telephone Encounter (Signed)
Left patient a detailed message, with results, on voicemail (ok per DPR).  Provided our number to call back with any questions.  

## 2018-02-25 LAB — C-REACTIVE PROTEIN: CRP: 3.9 mg/L (ref 0.0–4.9)

## 2018-02-25 LAB — PROTEIN ELECTROPHORESIS
A/G RATIO SPE: 0.6 — AB (ref 0.7–1.7)
Albumin ELP: 2.8 g/dL — ABNORMAL LOW (ref 2.9–4.4)
Alpha 1: 0.3 g/dL (ref 0.0–0.4)
Alpha 2: 0.6 g/dL (ref 0.4–1.0)
Beta: 1.3 g/dL (ref 0.7–1.3)
GAMMA GLOBULIN: 2.5 g/dL — AB (ref 0.4–1.8)
GLOBULIN, TOTAL: 4.8 g/dL — AB (ref 2.2–3.9)
Total Protein: 7.6 g/dL (ref 6.0–8.5)

## 2018-02-25 LAB — ANA W/REFLEX: Anti Nuclear Antibody(ANA): NEGATIVE

## 2018-02-25 LAB — HIV ANTIBODY (ROUTINE TESTING W REFLEX): HIV SCREEN 4TH GENERATION: NONREACTIVE

## 2018-02-25 LAB — COPPER, SERUM: Copper: 112 ug/dL (ref 72–166)

## 2018-02-25 LAB — SEDIMENTATION RATE: SED RATE: 83 mm/h — AB (ref 0–32)

## 2018-02-25 LAB — RPR: RPR Ser Ql: NONREACTIVE

## 2018-02-25 LAB — VITAMIN D 25 HYDROXY (VIT D DEFICIENCY, FRACTURES): VIT D 25 HYDROXY: 9.9 ng/mL — AB (ref 30.0–100.0)

## 2018-03-12 ENCOUNTER — Ambulatory Visit (INDEPENDENT_AMBULATORY_CARE_PROVIDER_SITE_OTHER): Payer: BLUE CROSS/BLUE SHIELD | Admitting: Neurology

## 2018-03-12 DIAGNOSIS — R531 Weakness: Secondary | ICD-10-CM

## 2018-03-12 DIAGNOSIS — R29898 Other symptoms and signs involving the musculoskeletal system: Secondary | ICD-10-CM | POA: Diagnosis not present

## 2018-03-12 DIAGNOSIS — R202 Paresthesia of skin: Secondary | ICD-10-CM | POA: Diagnosis not present

## 2018-03-12 DIAGNOSIS — G549 Nerve root and plexus disorder, unspecified: Secondary | ICD-10-CM

## 2018-03-12 NOTE — Procedures (Signed)
Full Name: Debbie PatienceHolly Brisbon Gender: Female MRN #: [id number] Date of Birth: 02-14-1971    Visit Date: 03/12/2018 09:45 Age: [age] Years 7 Months Old Examining Physician: Levert FeinsteinYijun Treven Holtman, MD  Referring Physician: Terrace ArabiaYan, MD History: 47 year old female, presented with subacute onset of bilateral lower extremity paresthesia, gait abnormality, also progressive worsening, recently involvement of bilateral upper extremity, she has a history of heavy alcohol abuse.  Summary of the tests:  Nerve conduction study: Bilateral sural, superficial peroneal, right median, ulnar, radial sensory responses were absent.  Bilateral tibial motor responses showed mildly decreased the C map amplitude, with mild slow conduction velocity, right side also has mildly prolonged distal latency.  Bilateral peroneal to EDB motor responses showed severely decreased the C map amplitude.  Right ulnar motor responses showed moderately decreased the C map amplitude, with moderate slowing of conduction velocity.  Right median motor responses showed borderline distal latency, mild slow conduction velocity.  Bilateral tibial, and the right ulnar f wave latencies were prolonged.  Electromyography: Selective needle examinations of right upper, lower extremity muscles, right lumbosacral paraspinal, and right cervical paraspinal muscles were performed.  There was no active neuropathic changes noted.  Conclusion:  This is an abnormal study.  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   ------------------------------- Levert FeinsteinYIjun Devontre Siedschlag M.D.  [facility] [address] [place], [place] [address] Tel: [phone] Fax: [phone]        [facility]    Nerve / Sites Muscle  Latency Ref. Amplitude Ref. Rel Amp Segments Distance Velocity Ref. Area    ms ms mV mV %  cm m/s m/s mVms  R Median - APB     Wrist APB 4.4 ?4.4 5.6 ?4.0 100 Wrist - APB 7   16.3     Upper arm APB 9.7  4.6  81.9 Upper arm - Wrist 24 45 ?49 14.5  R Ulnar - ADM     Wrist ADM 3.0 ?3.3 3.3 ?6.0 100 Wrist - ADM 7   11.2     B.Elbow ADM 8.2  2.4  74.3 B.Elbow - Wrist 20 39 ?49 8.5     A.Elbow ADM 11.5  2.9  117 A.Elbow - B.Elbow 12 36 ?49 9.6         A.Elbow - Wrist      R Peroneal - EDB     Ankle EDB 5.5 ?6.5 0.4 ?2.0 100 Ankle - EDB 9   1.2     Fib head EDB NR  NR  NR Fib head - Ankle   ?44 NR         Pop fossa - Ankle      L Peroneal - EDB     Ankle EDB 4.8 ?6.5 0.6 ?2.0 100 Ankle - EDB 9   2.0     Fib head EDB NR  NR  NR Fib head - Ankle   ?44 NR         Pop fossa - Ankle      R Tibial - AH     Ankle AH 5.9 ?5.8 4.5 ?4.0 100 Ankle - AH 9   10.7     Pop fossa AH 16.1  2.1  46 Pop fossa - Ankle 36 35 ?41 6.5  L Tibial -  AH     Ankle AH 5.0 ?5.8 2.3 ?4.0 100 Ankle - AH 9   6.5     Pop fossa AH 14.3  1.4  62 Pop fossa - Ankle 36 39 ?41 5.3                 SNC    Nerve / Sites Rec. Site Peak Lat Ref.  Amp Ref. Segments Distance    ms ms V V  cm  R Radial - Anatomical snuff box (Forearm)     Forearm Wrist NR ?2.9 NR ?15 Forearm - Wrist 10  R Sural - Ankle (Calf)     Calf Ankle NR ?4.4 NR ?6 Calf - Ankle 14  L Sural - Ankle (Calf)     Calf Ankle NR ?4.4 NR ?6 Calf - Ankle 14  R Superficial peroneal - Ankle     Lat leg Ankle NR ?4.4 NR ?6 Lat leg - Ankle 14  L Superficial peroneal - Ankle     Lat leg Ankle NR ?4.4 NR ?6 Lat leg - Ankle 14  R Median - Orthodromic (Dig II, Mid palm)     Dig II Wrist NR ?3.4 NR ?10 Dig II - Wrist 13  R Ulnar - Orthodromic, (Dig V, Mid palm)     Dig V Wrist NR ?3.1 NR ?5 Dig V - Wrist 80                   F  Wave    Nerve F Lat Ref.   ms ms  R Tibial - AH 59.8 ?56.0  L Tibial - AH 55.5 ?56.0  R Ulnar - ADM 35.4 ?32.0                   EMG full       EMG Summary Table    Spontaneous MUAP Recruitment  Muscle IA Fib PSW Fasc Other Amp Dur. Poly Pattern  R. Tibialis anterior Normal None None None _______ Normal Normal Normal Normal  R. Tibialis posterior Normal None None None _______ Normal Normal Normal Normal  R. Gastrocnemius (Medial head) Normal None None None _______ Normal Normal Normal Normal  R. Peroneus longus Normal None None None _______ Normal Normal Normal Normal  R. Vastus lateralis Normal None None None _______ Normal Normal Normal Normal  R. First dorsal interosseous Increased None None None _______ Normal Normal Normal Reduced  R. Pronator teres Normal None None None _______ Normal Normal Normal Normal  R. Biceps brachii Normal None None None _______ Normal Normal Normal Normal  R. Deltoid Normal None None None _______ Normal Normal Normal Normal  R. Triceps brachii Normal None None None _______ Normal Normal Normal Normal  R. Lumbar paraspinals (mid) Normal None None None _______ Normal Normal Normal Normal  R. Lumbar paraspinals (low) Normal None None None _______ Normal Normal Normal Normal  R. Cervical paraspinals Normal None None None _______ Normal Normal Normal Normal

## 2018-03-12 NOTE — Progress Notes (Signed)
PATIENT: Debbie Golden DOB: 1971-03-15  No chief complaint on file.    HISTORICAL  Debbie Golden is a 47 year old female, seen in refer by primary care doctor Rai, Ripudeep for evaluation of extremity weakness, she is accompanied by her fianc Debbie Golden at today's visit, initial evaluation was on February 23, 2018.  In January 2019, she began to have left knee pain, has difficulty bearing weight with her left leg, was seen by orthopedic surgeon, received injection, brace, instead of getting better, her gait abnormality become worse, by January 12, 2018, she noticed increased gait abnormality, numbness tingling from both knee down to her feet, trouble with her balance, few weeks later she also noticed bilateral hands paresthesia, achy pain, to the point of crying.  She was admitted to the hospital on January 22 to [facility], later transferred to [facility], now at the rehabilitation.  Extensive laboratory evaluations in January 2019, normal or negative vitamin B1, magnesium, phosphate, BMP, CBC showed hemoglobin of 8.5, elevated MCV of 124, MCH was 41, CPK was normal at 62,  UDS was positive for benzodiazepine, mild elevated free T4 1.15, B6 was mildly decreased 8.1,LXBWIO M35 597, folic acid, ferritin level was 341  TSH was mildly elevated 4.7  Spinal fluid testing showed elevated WBC 36, lymphocyte was 15%, neutrophilia was 81%, but it was a traumatic tap, pink spinal fluid, with RBC of 13,300, CSF total protein was 30  MRI of the brain in January 2019 was normal,MRI of lumbar spine showed mild degenerative disc disease, no significant foraminal canal stenosis, MRI of the thoracic spine was normal  That she may have significant gait abnormality, difficulty bearing weight, complains could not feel the ground, she denies bowel and bladder incontinence,  UPDATE March 12 2018: She return for electrodiagnostic study today, there was absent sensory potentials at all from  upper and lower extremity nerves tested, but only mildly decreased C map amplitude on the motor nerves tested, she reported a history of long time, and the binge alcohol drink before the symptoms onset in January 2019,  She continue have significant bilateral lower extremity paresthesia, slow worsening bilateral hands paresthesia and pain, difficulty using her hands, continued significant gait abnormality, areflexia, positive Romberg signs,  Laboratory evaluation showed elevated ESR 82, normal C-reactive protein 3.9    REVIEW OF SYSTEMS: Full 14 system review of systems performed and notable only for swelling legs, itching, anemia, joint pain, swelling, numbness, weakness, depression, anxiety, decreased energy  ALLERGIES: No Known Allergies  HOME MEDICATIONS: Current Outpatient Medications  Medication Sig Dispense Refill  . acetaminophen (TYLENOL) 325 MG tablet Take 2 tablets (650 mg total) by mouth every 6 (six) hours as needed for mild pain (or Fever >/= 101).    . Cholecalciferol (VITAMIN D3 PO) Take 4,000 Units by mouth daily.    . DULoxetine (CYMBALTA) 60 MG capsule Take 1 capsule (60 mg total) by mouth daily. 30 capsule 12  . folic acid (FOLVITE) 1 MG tablet Take 1 tablet (1 mg total) by mouth daily. 30 tablet 3  . lamoTRIgine (LAMICTAL) 100 MG tablet Take 100 mg by mouth daily.    . Multiple Vitamin (MULTIVITAMIN WITH MINERALS) TABS tablet Take 1 tablet by mouth daily.    Marland Kitchen pyridOXINE (VITAMIN B-6) 100 MG tablet Take 1 tablet (100 mg total) by mouth daily. 30 tablet 1  . risperiDONE (RISPERDAL) 0.5 MG tablet Take 1 tablet (0.5 mg total) by mouth 2 (two) times daily as needed (hallucinations). [place]  tablet 0  . risperiDONE (RISPERDAL) 2 MG tablet Take 1 tablet (2 mg total) by mouth at bedtime. 30 tablet 0  . thiamine 100 MG tablet Take 1 tablet (100 mg total) by mouth daily. 30 tablet 1  . traMADol (ULTRAM) 50 MG tablet Take 1 tablet (50 mg total) by mouth every 8 (eight) hours as needed  for moderate pain or severe pain. 10 tablet 0   No current facility-administered medications for this visit.     PAST MEDICAL HISTORY: Past Medical History:  Diagnosis Date  . Bipolar disorder ([place])   . Depression   . Genital herpes   . Obesity   . Weakness     PAST SURGICAL HISTORY: Past Surgical History:  Procedure Laterality Date  . ANKLE FRACTURE SURGERY Right   . GASTRIC BYPASS N/A 2007    FAMILY HISTORY: Family History  Adopted: Yes    SOCIAL HISTORY:  Social History   Socioeconomic History  . Marital status: Single    Spouse name: Not on file  . Number of children: 0  . Years of education: 37  . Highest education level: Master's degree (e.g., MA, MS, MEng, MEd, MSW, MBA)  Social Needs  . Financial resource strain: Not on file  . Food insecurity - worry: Not on file  . Food insecurity - inability: Not on file  . Transportation needs - medical: Not on file  . Transportation needs - non-medical: Not on file  Occupational History  . Occupation: Press photographer  Tobacco Use  . Smoking status: Never Smoker  . Smokeless tobacco: Never Used  Substance and Sexual Activity  . Alcohol use: Yes    Comment: 3 drinks per week  . Drug use: No  . Sexual activity: Not on file  Other Topics Concern  . Not on file  Social History Narrative   Lives at home with her fiance, Debbie Golden.   Right-handed.   One cup caffeine per day.     PHYSICAL EXAM   There were no vitals filed for this visit.  Not recorded      There is no height or weight on file to calculate BMI.  PHYSICAL EXAMNIATION:  Gen: NAD, conversant, well nourised, obese, well groomed                     Cardiovascular: Regular rate rhythm, no peripheral edema, warm, nontender. Eyes: Conjunctivae clear without exudates or hemorrhage Neck: Supple, no carotid bruits. Pulmonary: Clear to auscultation bilaterally   NEUROLOGICAL EXAM:  MENTAL STATUS: Speech:    Speech is normal; fluent and spontaneous  with normal comprehension.  Cognition:     Orientation to time, place and person     Normal recent and remote memory     Normal Attention span and concentration     Normal Language, naming, repeating,spontaneous speech     Fund of knowledge   CRANIAL NERVES: CN II: Visual fields are full to confrontation. Fundoscopic exam is normal with sharp discs and no vascular changes. Pupils are round equal and briskly reactive to light. CN III, IV, VI: extraocular movement are normal. No ptosis. CN V: Facial sensation is intact to pinprick in all 3 divisions bilaterally. Corneal responses are intact.  CN VII: Face is symmetric with normal eye closure and smile. CN VIII: Hearing is normal to rubbing fingers CN IX, X: Palate elevates symmetrically. Phonation is normal. CN XI: Head turning and shoulder shrug are intact CN XII: Tongue is midline with normal movements  and no atrophy.  MOTOR: There is no pronator drift of out-stretched arms. Muscle bulk and tone are normal. Muscle strength is normal.  REFLEXES: Areflexia and symmetric,  SENSORY: Intact to light touch, pinprick, mildly decreased vibratory sensation in toes, loss of proprioception at the toes and fingers,  COORDINATION: Rapid alternating movements and fine finger movements are intact. There is no dysmetria on finger-to-nose and heel-knee-shin.    GAIT/STANCE: She needs assistance to get up from sitting position,  unsteady, positive Romberg signs   DIAGNOSTIC DATA (LABS, IMAGING, TESTING) - I reviewed patient records, labs, notes, testing and imaging myself where available.   ASSESSMENT AND PLAN  Akshara Blumenthal is a 47 y.o. 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 Ph.D.  [facility] [address] [place], [place] [address] Ph: [phone] Fax: [phone]  CC: Mendel Corning, MD

## 2018-03-15 ENCOUNTER — Telehealth: Payer: Self-pay | Admitting: Neurology

## 2018-03-15 LAB — THYROID PANEL WITH TSH
FREE THYROXINE INDEX: 1.7 (ref 1.2–4.9)
T3 UPTAKE RATIO: 23 % — AB (ref 24–39)
T4 TOTAL: 7.2 ug/dL (ref 4.5–12.0)
TSH: 1.57 u[IU]/mL (ref 0.450–4.500)

## 2018-03-15 LAB — ANA W/REFLEX IF POSITIVE
Anti Nuclear Antibody(ANA): POSITIVE — AB
DSDNA AB: 12 [IU]/mL — AB (ref 0–9)
ENA RNP Ab: 0.2 AI (ref 0.0–0.9)
ENA SM Ab Ser-aCnc: <0.2 AI (ref 0.0–0.9)
ENA SSA (RO) Ab: <0.2 AI (ref 0.0–0.9)
ENA SSB (LA) Ab: <0.2 AI (ref 0.0–0.9)
Scleroderma SCL-70: <0.2 AI (ref 0.0–0.9)

## 2018-03-15 LAB — HGB A1C W/O EAG: Hgb A1c MFr Bld: 4.5 % — ABNORMAL LOW (ref 4.8–5.6)

## 2018-03-15 LAB — C-REACTIVE PROTEIN: CRP: 2.8 mg/L (ref 0.0–4.9)

## 2018-03-15 LAB — SEDIMENTATION RATE: Sed Rate: 34 mm/hr — ABNORMAL HIGH (ref 0–32)

## 2018-03-15 NOTE — Telephone Encounter (Signed)
Please call patient, laboratory evaluation continues to show mild elevated ESR but only 34, much improved in comparison to previous 83 2 weeks ago, there was also evidence of positive ANA, double-stranded DNA,  She will continue to proceed with fluoroscopy guided lumbar puncture, May consider repeat laboratory evaluation later

## 2018-03-15 NOTE — Telephone Encounter (Signed)
Left message requesting a return call.

## 2018-03-16 NOTE — Telephone Encounter (Signed)
Left message for a return call

## 2018-03-16 NOTE — Telephone Encounter (Signed)
Left another message requesting a return call. 

## 2018-03-17 ENCOUNTER — Encounter: Payer: Self-pay | Admitting: *Deleted

## 2018-03-17 NOTE — Telephone Encounter (Signed)
Left another message for patient.  I will mail her an unable to contact letter today.  Hopefully, she will return our call.  Dr. Terrace ArabiaYan placed orders for LP.  Her pending appt w/ Dr. Terrace ArabiaYan is 04/27/18.

## 2018-03-29 ENCOUNTER — Telehealth: Payer: Self-pay | Admitting: Neurology

## 2018-03-29 NOTE — Telephone Encounter (Signed)
PT orders provided.  He will fax over orders for MD signature.

## 2018-03-29 NOTE — Telephone Encounter (Signed)
Thayer JewGarry Scott a PT with [facility] requesting verbal orders for twice a week for 4 weeks. Please contact at [phone]

## 2018-03-31 ENCOUNTER — Telehealth: Payer: Self-pay | Admitting: Neurology

## 2018-03-31 NOTE — Telephone Encounter (Signed)
Returned call to Zella BallRobin to provide verbal orders.

## 2018-03-31 NOTE — Telephone Encounter (Signed)
Robin OT with [facility] requesting verbal order for OT ADL training once a week for 1 week and twice a week for 4 weeks. Zella BallRobin can be reached at [phone]

## 2018-04-01 ENCOUNTER — Emergency Department: Payer: BLUE CROSS/BLUE SHIELD

## 2018-04-01 ENCOUNTER — Observation Stay
Admission: EM | Admit: 2018-04-01 | Discharge: 2018-04-05 | Disposition: A | Discharge status: Skilled Nursing Facility | Payer: BLUE CROSS/BLUE SHIELD | Attending: Specialist | Admitting: Specialist

## 2018-04-01 ENCOUNTER — Encounter: Payer: Self-pay | Admitting: Emergency Medicine

## 2018-04-01 ENCOUNTER — Observation Stay: Payer: BLUE CROSS/BLUE SHIELD

## 2018-04-01 ENCOUNTER — Other Ambulatory Visit: Payer: Self-pay

## 2018-04-01 DIAGNOSIS — S82892A Other fracture of left lower leg, initial encounter for closed fracture: Secondary | ICD-10-CM | POA: Diagnosis present

## 2018-04-01 DIAGNOSIS — F329 Major depressive disorder, single episode, unspecified: Secondary | ICD-10-CM | POA: Diagnosis not present

## 2018-04-01 DIAGNOSIS — S9305XA Dislocation of left ankle joint, initial encounter: Secondary | ICD-10-CM

## 2018-04-01 DIAGNOSIS — S82842A Displaced bimalleolar fracture of left lower leg, initial encounter for closed fracture: Secondary | ICD-10-CM | POA: Diagnosis not present

## 2018-04-01 DIAGNOSIS — W1842XA Slipping, tripping and stumbling without falling due to stepping into hole or opening, initial encounter: Secondary | ICD-10-CM | POA: Insufficient documentation

## 2018-04-01 DIAGNOSIS — Y929 Unspecified place or not applicable: Secondary | ICD-10-CM | POA: Diagnosis not present

## 2018-04-01 DIAGNOSIS — S82852A Displaced trimalleolar fracture of left lower leg, initial encounter for closed fracture: Secondary | ICD-10-CM

## 2018-04-01 DIAGNOSIS — Z79899 Other long term (current) drug therapy: Secondary | ICD-10-CM | POA: Diagnosis not present

## 2018-04-01 LAB — CBC WITH DIFFERENTIAL/PLATELET
BASOS ABS: 0 10*3/uL (ref 0–0.1)
Basophils Relative: 1 %
EOS PCT: 0 %
Eosinophils Absolute: 0 10*3/uL (ref 0–0.7)
HCT: 33.3 % — ABNORMAL LOW (ref 35.0–47.0)
Hemoglobin: 11 g/dL — ABNORMAL LOW (ref 12.0–16.0)
LYMPHS PCT: 24 %
Lymphs Abs: 1.7 10*3/uL (ref 1.0–3.6)
MCH: 33 pg (ref 26.0–34.0)
MCHC: 33 g/dL (ref 32.0–36.0)
MCV: 100.1 fL — ABNORMAL HIGH (ref 80.0–100.0)
Monocytes Absolute: 0.4 10*3/uL (ref 0.2–0.9)
Monocytes Relative: 6 %
NEUTROS ABS: 4.8 10*3/uL (ref 1.4–6.5)
Neutrophils Relative %: 69 %
PLATELETS: 292 10*3/uL (ref 150–440)
RBC: 3.32 MIL/uL — AB (ref 3.80–5.20)
RDW: 14.2 % (ref 11.5–14.5)
WBC: 6.9 10*3/uL (ref 3.6–11.0)

## 2018-04-01 LAB — PROTIME-INR
INR: 1.13
PROTHROMBIN TIME: 14.4 s (ref 11.4–15.2)

## 2018-04-01 LAB — URINALYSIS, ROUTINE W REFLEX MICROSCOPIC
Bilirubin Urine: NEGATIVE
GLUCOSE, UA: NEGATIVE mg/dL
Hgb urine dipstick: NEGATIVE
Ketones, ur: 5 mg/dL — AB
Nitrite: NEGATIVE
PH: 6 (ref 5.0–8.0)
Protein, ur: NEGATIVE mg/dL
SPECIFIC GRAVITY, URINE: 1.011 (ref 1.005–1.030)

## 2018-04-01 LAB — BASIC METABOLIC PANEL
ANION GAP: 13 (ref 5–15)
BUN: 6 mg/dL (ref 6–20)
CO2: 20 mmol/L — ABNORMAL LOW (ref 22–32)
Calcium: 8.6 mg/dL — ABNORMAL LOW (ref 8.9–10.3)
Chloride: 106 mmol/L (ref 101–111)
Creatinine, Ser: 0.87 mg/dL (ref 0.44–1.00)
GFR calc Af Amer: >60 mL/min (ref >60)
Glucose, Bld: 95 mg/dL (ref 65–99)
POTASSIUM: 3.7 mmol/L (ref 3.5–5.1)
SODIUM: 139 mmol/L (ref 135–145)

## 2018-04-01 LAB — SURGICAL PCR SCREEN
MRSA, PCR: NEGATIVE
Staphylococcus aureus: POSITIVE — AB

## 2018-04-01 MED ORDER — DULOXETINE HCL 60 MG PO CPEP
60.0000 mg | ORAL_CAPSULE | Freq: Every day | ORAL | Status: DC
  Administered 2018-04-02: 60 mg via ORAL
  Filled 2018-04-01: qty 1

## 2018-04-01 MED ORDER — BUPIVACAINE HCL (PF) 0.5 % IJ SOLN
5.0000 mL | Freq: Once | INTRAMUSCULAR | Status: AC
  Administered 2018-04-01: 5 mL
  Filled 2018-04-01: qty 10

## 2018-04-01 MED ORDER — HYDROMORPHONE HCL 1 MG/ML IJ SOLN
1.0000 mg | Freq: Once | INTRAMUSCULAR | Status: AC
  Administered 2018-04-01: 1 mg via INTRAVENOUS

## 2018-04-01 MED ORDER — MUPIROCIN 2 % EX OINT
1.0000 "application " | TOPICAL_OINTMENT | Freq: Two times a day (BID) | CUTANEOUS | Status: AC
  Administered 2018-04-01 – 2018-04-03 (×4): 1 via NASAL
  Filled 2018-04-01: qty 22

## 2018-04-01 MED ORDER — LIDOCAINE HCL (PF) 1 % IJ SOLN
5.0000 mL | Freq: Once | INTRAMUSCULAR | Status: AC
  Administered 2018-04-01: 5 mL

## 2018-04-01 MED ORDER — LAMOTRIGINE 100 MG PO TABS
100.0000 mg | ORAL_TABLET | Freq: Every day | ORAL | Status: DC
  Administered 2018-04-02: 100 mg via ORAL
  Filled 2018-04-01: qty 4
  Filled 2018-04-01: qty 1

## 2018-04-01 MED ORDER — HYDROCODONE-ACETAMINOPHEN 5-325 MG PO TABS
1.0000 | ORAL_TABLET | ORAL | Status: DC | PRN
  Administered 2018-04-02 (×4): 1 via ORAL
  Filled 2018-04-01 (×4): qty 1

## 2018-04-01 MED ORDER — ONDANSETRON HCL 4 MG/2ML IJ SOLN
4.0000 mg | Freq: Once | INTRAMUSCULAR | Status: AC
  Administered 2018-04-01: 4 mg via INTRAVENOUS
  Filled 2018-04-01: qty 2

## 2018-04-01 MED ORDER — CHLORHEXIDINE GLUCONATE 4 % EX LIQD
60.0000 mL | Freq: Once | CUTANEOUS | Status: AC
  Administered 2018-04-01: 4 via TOPICAL

## 2018-04-01 MED ORDER — MORPHINE SULFATE (PF) 2 MG/ML IV SOLN
1.0000 mg | INTRAVENOUS | Status: DC | PRN
  Administered 2018-04-02 (×6): 1 mg via INTRAVENOUS
  Filled 2018-04-01 (×5): qty 1

## 2018-04-01 MED ORDER — FOLIC ACID 1 MG PO TABS: 1.0000 mg | ORAL_TABLET | Freq: Every day | ORAL | Status: DC

## 2018-04-01 MED ORDER — TRAMADOL HCL 50 MG PO TABS
50.0000 mg | ORAL_TABLET | Freq: Three times a day (TID) | ORAL | Status: DC | PRN
  Administered 2018-04-01: 50 mg via ORAL
  Filled 2018-04-01: qty 1

## 2018-04-01 MED ORDER — HYDROMORPHONE HCL 1 MG/ML IJ SOLN
INTRAMUSCULAR | Status: AC
  Filled 2018-04-01: qty 1

## 2018-04-01 MED ORDER — SODIUM CHLORIDE 0.9 % IV SOLN
INTRAVENOUS | Status: DC
  Administered 2018-04-01 – 2018-04-02 (×2): via INTRAVENOUS

## 2018-04-01 MED ORDER — CLINDAMYCIN PHOSPHATE 600 MG/50ML IV SOLN
600.0000 mg | INTRAVENOUS | Status: AC
  Administered 2018-04-02: 600 mg via INTRAVENOUS
  Filled 2018-04-01: qty 50

## 2018-04-01 MED ORDER — GABAPENTIN 300 MG PO CAPS
300.0000 mg | ORAL_CAPSULE | Freq: Once | ORAL | Status: AC
  Administered 2018-04-01: 300 mg via ORAL
  Filled 2018-04-01: qty 1

## 2018-04-01 MED ORDER — HYDROMORPHONE HCL 1 MG/ML IJ SOLN
1.0000 mg | Freq: Once | INTRAMUSCULAR | Status: AC
  Administered 2018-04-01: 1 mg via INTRAVENOUS
  Filled 2018-04-01: qty 1

## 2018-04-01 MED ORDER — VITAMIN B-6 100 MG PO TABS
100.0000 mg | ORAL_TABLET | Freq: Every day | ORAL | Status: DC
  Filled 2018-04-01: qty 1

## 2018-04-01 MED ORDER — LIDOCAINE HCL (PF) 1 % IJ SOLN
INTRAMUSCULAR | Status: AC
  Administered 2018-04-01: 5 mL
  Filled 2018-04-01: qty 5

## 2018-04-01 MED ORDER — ADULT MULTIVITAMIN W/MINERALS CH: 1.0000 | ORAL_TABLET | Freq: Every day | ORAL | Status: DC

## 2018-04-01 MED ORDER — VITAMIN D 1000 UNITS PO TABS: 4000.0000 [IU] | ORAL_TABLET | Freq: Every day | ORAL | Status: DC

## 2018-04-01 MED ORDER — MELOXICAM 7.5 MG PO TABS
15.0000 mg | ORAL_TABLET | Freq: Every day | ORAL | Status: DC
  Administered 2018-04-02: 15 mg via ORAL
  Filled 2018-04-01: qty 2

## 2018-04-01 MED ORDER — RISPERIDONE 1 MG PO TABS
2.0000 mg | ORAL_TABLET | Freq: Every day | ORAL | Status: DC
  Administered 2018-04-01: 2 mg via ORAL
  Filled 2018-04-01 (×2): qty 2

## 2018-04-01 MED ORDER — RISPERIDONE 1 MG PO TABS
0.5000 mg | ORAL_TABLET | Freq: Two times a day (BID) | ORAL | Status: DC | PRN
  Filled 2018-04-01: qty 0.5

## 2018-04-01 MED ORDER — ACETAMINOPHEN 325 MG PO TABS: 650.0000 mg | ORAL_TABLET | Freq: Four times a day (QID) | ORAL | Status: DC | PRN

## 2018-04-01 MED ORDER — CEFAZOLIN SODIUM-DEXTROSE 2-4 GM/100ML-% IV SOLN
2.0000 g | INTRAVENOUS | Status: AC
  Administered 2018-04-02: 2 g via INTRAVENOUS
  Filled 2018-04-01: qty 100

## 2018-04-01 MED ORDER — VITAMIN B-1 100 MG PO TABS: 100.0000 mg | ORAL_TABLET | Freq: Every day | ORAL | Status: DC

## 2018-04-01 NOTE — ED Triage Notes (Signed)
Pt presents to ED via [facility] c/o fall with ankle injury. Pt states she stepped in a hole at a friend's house around 1400. Swelling noted to L ankle.

## 2018-04-01 NOTE — ED Provider Notes (Signed)
[facility] EMERGENCY DEPARTMENT Provider Note   CSN: [id number] Arrival date & time: 04/01/18  1657     History   Chief Complaint Chief Complaint  Patient presents with  . Ankle Pain    HPI Debbie Golden is a 47 y.o. female.  Presents to the emergency department for evaluation of left ankle pain and deformity.  Patient states around 2 PM today she was walking in a friend's backyard when she stepped into a hole and rolled her left ankle.  She was unable to bear weight after the accident.  She denies any other injury to her body.  She denies any left knee or proximal fibular pain.  Patient's pain is diffuse throughout the left ankle.  She denies any numbness or tingling throughout the foot.  Her pain is severe 10 out of 10.  Last time she had anything to eat or drink was 12 PM today.  HPI  Past Medical History:  Diagnosis Date  . Bipolar disorder (HCC)   . Depression   . Genital herpes   . Obesity   . Weakness     Patient Active Problem List   Diagnosis Date Noted  . Closed left ankle fracture, initial encounter 04/01/2018  . Paresthesia 02/23/2018  . Weakness 02/23/2018  . Gait abnormality 02/23/2018  . Acute encephalopathy   . Alcohol withdrawal syndrome, with delirium (HCC)   . Altered mental status   . Leg weakness, bilateral 01/20/2018  . Obesity 01/20/2018  . Bipolar disorder (HCC) 01/20/2018  . Macrocytic anemia 01/20/2018  . Acute hypokalemia 01/20/2018  . Alcohol use 01/20/2018  . Guillain Barr syndrome (HCC)   . Catatonia associated with another mental disorder 11/10/2016  . Noncompliance 11/10/2016  . Involuntary commitment 11/10/2016  . GERD (gastroesophageal reflux disease) 06/25/2015  . Bipolar disorder, curr episode mixed, severe, with psychotic features (HCC) 06/22/2015  . Bipolar disorder (manic depression) (HCC) 06/21/2015  . Hypokalemia 06/21/2015  . Delirium, acute 06/19/2015  . Major depression 06/19/2015  . Alcohol abuse  06/19/2015    Past Surgical History:  Procedure Laterality Date  . ANKLE FRACTURE SURGERY Right   . GASTRIC BYPASS N/A 2007     OB History   None      Home Medications    Prior to Admission medications   Medication Sig Start Date End Date Taking? Authorizing Provider  acetaminophen (TYLENOL) 325 MG tablet Take 2 tablets (650 mg total) by mouth every 6 (six) hours as needed for mild pain (or Fever >/= 101). 01/28/18  Yes Rai, Ripudeep K, MD  Cholecalciferol (VITAMIN D3 PO) Take 4,000 Units by mouth daily.   Yes [provider]  DULoxetine (CYMBALTA) 60 MG capsule Take 1 capsule (60 mg total) by mouth daily. 02/23/18  Yes Levert FeinsteinYan, Yijun, MD  folic acid (FOLVITE) 1 MG tablet Take 1 tablet (1 mg total) by mouth daily. 01/28/18  Yes Rai, Ripudeep K, MD  lamoTRIgine (LAMICTAL) 100 MG tablet Take 100 mg by mouth daily.   Yes [provider]  Multiple Vitamin (MULTIVITAMIN WITH MINERALS) TABS tablet Take 1 tablet by mouth daily. 01/28/18  Yes Rai, Ripudeep K, MD  pyridOXINE (VITAMIN B-6) 100 MG tablet Take 1 tablet (100 mg total) by mouth daily. 01/28/18  Yes Rai, Ripudeep K, MD  risperiDONE (RISPERDAL) 0.5 MG tablet Take 1 tablet (0.5 mg total) by mouth 2 (two) times daily as needed (hallucinations). 01/28/18  Yes Rai, Ripudeep K, MD  risperiDONE (RISPERDAL) 2 MG tablet Take 1 tablet (  2 mg total) by mouth at bedtime. 01/28/18  Yes Rai, Ripudeep K, MD  thiamine 100 MG tablet Take 1 tablet (100 mg total) by mouth daily. 01/28/18  Yes Rai, Ripudeep K, MD  traMADol (ULTRAM) 50 MG tablet Take 1 tablet (50 mg total) by mouth every 8 (eight) hours as needed for moderate pain or severe pain. 01/30/18  Yes Rai, Delene Ruffini, MD    Family History Family History  Adopted: Yes    Social History Social History   Tobacco Use  . Smoking status: Never Smoker  . Smokeless tobacco: Never Used  Substance Use Topics  . Alcohol use: Yes    Comment: 3 drinks per week  . Drug use: No      Allergies   Patient has no known allergies.   Review of Systems Review of Systems  Constitutional: Negative for fever.  Respiratory: Negative for cough and shortness of breath.   Cardiovascular: Negative for chest pain.  Gastrointestinal: Negative for abdominal pain.  Musculoskeletal: Positive for arthralgias, gait problem and joint swelling. Negative for myalgias and neck pain.  Skin: Negative for wound.  Neurological: Negative for dizziness, syncope, light-headedness, numbness and headaches.     Physical Exam Updated Vital Signs BP 126/85 (BP Location: Left Arm)   Pulse (!) 118   Temp 98.2 F (36.8 C) (Oral)   Resp 18   Ht 5\' 7"  (1.702 m)   Wt 96.6 kg (213 lb)   SpO2 99%   BMI 33.36 kg/m   Physical Exam  Constitutional: She is oriented to person, place, and time. She appears well-developed and well-nourished.  HENT:  Head: Normocephalic.  Cardiovascular: Regular rhythm.  Pulmonary/Chest: Effort normal. No stridor. No respiratory distress. She has no wheezes. She has no rales.  Abdominal: Soft. She exhibits no distension. There is no tenderness.  Musculoskeletal: She exhibits tenderness and deformity.  Deformity noted to the left ankle with diffuse swelling.  No skin breakdown noted.  Pulses are present along dorsalis pedis.  Sensation is intact distally.  She is nontender throughout the mid to upper tib-fib region with no knee discomfort.  She is stable to valgus and varus stress testing of the left knee.  Neurological: She is alert and oriented to person, place, and time.  Skin: Skin is warm. No rash noted. No erythema.  Psychiatric: She has a normal mood and affect. Her behavior is normal.     ED Treatments / Results  Labs (all labs ordered are listed, but only abnormal results are displayed) Labs Reviewed - No data to display  EKG None  Radiology Dg Ankle Complete Left  Result Date: 04/01/2018 CLINICAL DATA:  Fall with ankle injury EXAM: LEFT ANKLE  COMPLETE - 3+ VIEW COMPARISON:  None. FINDINGS: Acute, comminuted and displaced medial malleolar fracture. Posterior dislocation of the talus with respect to the distal tibia. Posteriorly displaced posterior malleolar fracture fragment measuring 16 mm. Probable small fracture fragment off the lateral fibular malleolus. Moderate to large plantar calcaneal spur. IMPRESSION: Fracture dislocation of the ankle; the talus is displaced by about 1 bone with in a posterior direction with respect to the distal tibia. Acute comminuted and displaced fractures of the medial malleolus and posterior malleolus of the distal tibia. Fibular evaluation limited due to bony overlap, possible small fracture fragment adjacent to the tip of the fibular malleolus on the lateral view. Electronically Signed   By: Jasmine Pang M.D.   On: 04/01/2018 18:28    Procedures .Ortho Injury Treatment Date/Time: 04/01/2018  7:05 PM Performed by: Evon Slack, PA-C Authorized by: Evon Slack, PA-C   Consent:    Consent obtained:  Verbal   Consent given by:  Patient   Risks discussed:  Irreducible dislocation and recurrent dislocation   Alternatives discussed:  No treatmentInjury location: ankle Location details: left ankle Injury type: fracture-dislocation Pre-procedure neurovascular assessment: neurovascularly intact Pre-procedure distal perfusion: normal Pre-procedure neurological function: normal Pre-procedure range of motion: reduced Anesthesia: hematoma block  Anesthesia: Local anesthesia used: yes Local Anesthetic: lidocaine 1% without epinephrine and bupivacaine 0.5% without epinephrine Anesthetic total: 10 mL  Patient sedated: NoManipulation performed: yes Skin traction used: no Skeletal traction used: no Reduction successful: yes X-ray confirmed reduction: yes Immobilization: splint Splint type: short leg Supplies used: cotton padding,  elastic bandage and Ortho-Glass Post-procedure neurovascular  assessment: post-procedure neurovascularly intact Post-procedure distal perfusion: normal Post-procedure neurological function: normal Post-procedure range of motion: improved Patient tolerance: Patient tolerated the procedure well with no immediate complications    (including critical care time)  Medications Ordered in ED Medications  0.9 %  sodium chloride infusion (has no administration in time range)  HYDROmorphone (DILAUDID) injection 1 mg (1 mg Intravenous Given 04/01/18 1752)  ondansetron (ZOFRAN) injection 4 mg (4 mg Intravenous Given 04/01/18 1753)  HYDROmorphone (DILAUDID) injection 1 mg (1 mg Intravenous Given 04/01/18 1758)  lidocaine (PF) (XYLOCAINE) 1 % injection 5 mL (5 mLs Infiltration Given by Other 04/01/18 1829)  bupivacaine (MARCAINE) 0.5 % injection 5 mL (5 mLs Infiltration Given by Other 04/01/18 1830)     Initial Impression / Assessment and Plan / ED Course  I have reviewed the triage vital signs and the nursing notes.  Pertinent labs & imaging results that were available during my care of the patient were reviewed by me and considered in my medical decision making (see chart for details).     47 year old female with dislocated trimalleolar ankle fracture to the left ankle.  Pain was improved with Dilaudid.  Patient tolerated hematoma block well.  Closed reduction and splinting of ankle fracture dislocation was performed.  Patient tolerated procedure well.  Postreduction films ordered and reviewed by me today show improved alignment of the ankle.  Slight displacement of the medial malleolus fragment.  Discussed case with orthopedics, Dr. Hyacinth Meeker.  Dr. Hyacinth Meeker will be admitting the patient and planning for possible ORIF tomorrow.  Final Clinical Impressions(s) / ED Diagnoses   Final diagnoses:  Closed trimalleolar fracture of left ankle, initial encounter  Ankle dislocation, left, initial encounter    ED Discharge Orders    None       Ronnette Juniper 04/01/18 Alease Medina, MD 04/01/18 [phone]

## 2018-04-02 ENCOUNTER — Observation Stay: Payer: BLUE CROSS/BLUE SHIELD | Admitting: Certified Registered"

## 2018-04-02 ENCOUNTER — Telehealth: Payer: Self-pay | Admitting: Neurology

## 2018-04-02 ENCOUNTER — Encounter
Admission: EM | Disposition: A | Discharge status: Skilled Nursing Facility | Payer: Self-pay | Source: Home / Self Care | Attending: Emergency Medicine

## 2018-04-02 HISTORY — PX: ORIF ANKLE FRACTURE: SHX5408

## 2018-04-02 SURGERY — OPEN REDUCTION INTERNAL FIXATION (ORIF) ANKLE FRACTURE
Anesthesia: General | Laterality: Left

## 2018-04-02 MED ORDER — LACTATED RINGERS IV SOLN
INTRAVENOUS | Status: DC | PRN
  Administered 2018-04-02: 22:00:00 via INTRAVENOUS

## 2018-04-02 MED ORDER — MIDAZOLAM HCL 2 MG/2ML IJ SOLN
INTRAMUSCULAR | Status: AC
  Filled 2018-04-02: qty 2

## 2018-04-02 MED ORDER — ROCURONIUM BROMIDE 50 MG/5ML IV SOLN
INTRAVENOUS | Status: AC
  Filled 2018-04-02: qty 1

## 2018-04-02 MED ORDER — ROCURONIUM BROMIDE 100 MG/10ML IV SOLN
INTRAVENOUS | Status: DC | PRN
  Administered 2018-04-02: 5 mg via INTRAVENOUS

## 2018-04-02 MED ORDER — ACETAMINOPHEN 10 MG/ML IV SOLN
INTRAVENOUS | Status: AC
  Filled 2018-04-02: qty 100

## 2018-04-02 MED ORDER — LIDOCAINE HCL (PF) 2 % IJ SOLN
INTRAMUSCULAR | Status: AC
  Filled 2018-04-02: qty 10

## 2018-04-02 MED ORDER — PROPOFOL 10 MG/ML IV BOLUS
INTRAVENOUS | Status: AC
  Filled 2018-04-02: qty 20

## 2018-04-02 MED ORDER — FENTANYL CITRATE (PF) 100 MCG/2ML IJ SOLN
INTRAMUSCULAR | Status: AC
  Filled 2018-04-02: qty 2

## 2018-04-02 MED ORDER — NEOMYCIN-POLYMYXIN B GU 40-200000 IR SOLN
Status: AC
  Filled 2018-04-02: qty 4

## 2018-04-02 MED ORDER — LORAZEPAM 0.5 MG PO TABS
0.5000 mg | ORAL_TABLET | Freq: Four times a day (QID) | ORAL | Status: DC | PRN
Start: 2018-04-02 — End: 2018-04-05
  Administered 2018-04-02 (×2): 0.5 mg via ORAL
  Filled 2018-04-02 (×2): qty 1

## 2018-04-02 MED ORDER — ONDANSETRON HCL 4 MG/2ML IJ SOLN
INTRAMUSCULAR | Status: DC | PRN
  Administered 2018-04-02: 4 mg via INTRAVENOUS

## 2018-04-02 MED ORDER — DEXAMETHASONE SODIUM PHOSPHATE 10 MG/ML IJ SOLN
INTRAMUSCULAR | Status: DC | PRN
  Administered 2018-04-02: 4 mg via INTRAVENOUS

## 2018-04-02 MED ORDER — FENTANYL CITRATE (PF) 100 MCG/2ML IJ SOLN
25.0000 ug | INTRAMUSCULAR | Status: DC | PRN
  Administered 2018-04-03 (×4): 25 ug via INTRAVENOUS

## 2018-04-02 MED ORDER — ONDANSETRON HCL 4 MG/2ML IJ SOLN: 4.0000 mg | Freq: Once | INTRAMUSCULAR | Status: DC | PRN

## 2018-04-02 MED ORDER — PROPOFOL 10 MG/ML IV BOLUS
INTRAVENOUS | Status: DC | PRN
  Administered 2018-04-02: 150 mg via INTRAVENOUS

## 2018-04-02 MED ORDER — ONDANSETRON HCL 4 MG/2ML IJ SOLN
INTRAMUSCULAR | Status: AC
  Filled 2018-04-02: qty 2

## 2018-04-02 MED ORDER — ACETAMINOPHEN 10 MG/ML IV SOLN
INTRAVENOUS | Status: DC | PRN
  Administered 2018-04-02: 1000 mg via INTRAVENOUS

## 2018-04-02 MED ORDER — DEXAMETHASONE SODIUM PHOSPHATE 10 MG/ML IJ SOLN
INTRAMUSCULAR | Status: AC
  Filled 2018-04-02: qty 1

## 2018-04-02 MED ORDER — CLINDAMYCIN PHOSPHATE 600 MG/50ML IV SOLN
INTRAVENOUS | Status: AC
  Filled 2018-04-02: qty 50

## 2018-04-02 MED ORDER — SUCCINYLCHOLINE CHLORIDE 20 MG/ML IJ SOLN
INTRAMUSCULAR | Status: AC
  Filled 2018-04-02: qty 1

## 2018-04-02 MED ORDER — SUCCINYLCHOLINE CHLORIDE 20 MG/ML IJ SOLN
INTRAMUSCULAR | Status: DC | PRN
  Administered 2018-04-02: 100 mg via INTRAVENOUS

## 2018-04-02 MED ORDER — MIDAZOLAM HCL 2 MG/2ML IJ SOLN
INTRAMUSCULAR | Status: DC | PRN
  Administered 2018-04-02: 2 mg via INTRAVENOUS

## 2018-04-02 MED ORDER — SEVOFLURANE IN SOLN
RESPIRATORY_TRACT | Status: AC
  Filled 2018-04-02: qty 250

## 2018-04-02 MED ORDER — BUPIVACAINE HCL (PF) 0.5 % IJ SOLN
INTRAMUSCULAR | Status: AC
  Filled 2018-04-02: qty 30

## 2018-04-02 MED ORDER — BUPIVACAINE HCL 0.5 % IJ SOLN
INTRAMUSCULAR | Status: DC | PRN
  Administered 2018-04-02: 30 mL

## 2018-04-02 MED ORDER — HYDROMORPHONE HCL 1 MG/ML IJ SOLN
INTRAMUSCULAR | Status: AC
Start: 2018-04-02 — End: 2018-04-02
  Filled 2018-04-02: qty 1

## 2018-04-02 MED ORDER — FENTANYL CITRATE (PF) 100 MCG/2ML IJ SOLN
INTRAMUSCULAR | Status: DC | PRN
  Administered 2018-04-02 (×2): 50 ug via INTRAVENOUS

## 2018-04-02 MED ORDER — LIDOCAINE HCL (CARDIAC) 20 MG/ML IV SOLN
INTRAVENOUS | Status: DC | PRN
  Administered 2018-04-02: 100 mg via INTRAVENOUS

## 2018-04-02 MED ORDER — HYDROMORPHONE HCL 1 MG/ML IJ SOLN
INTRAMUSCULAR | Status: DC | PRN
  Administered 2018-04-02 (×2): 0.5 mg via INTRAVENOUS

## 2018-04-02 SURGICAL SUPPLY — 40 items
BIT DRILL CANN 2.7X625 NONSTRL (BIT) ×2 IMPLANT
BLADE SURG SZ10 CARB STEEL (BLADE) ×3 IMPLANT
BNDG COHESIVE 4X5 TAN STRL (GAUZE/BANDAGES/DRESSINGS) ×1 IMPLANT
BNDG ESMARK 6X12 TAN STRL LF (GAUZE/BANDAGES/DRESSINGS) ×3 IMPLANT
CANISTER SUCT 1200ML W/VALVE (MISCELLANEOUS) ×3 IMPLANT
CAST PADDING 6X4YD ST 30248 (SOFTGOODS) ×2
CHLORAPREP W/TINT 26ML (MISCELLANEOUS) ×5 IMPLANT
CUFF TOURN 24 STER (MISCELLANEOUS) IMPLANT
CUFF TOURN 30 STER DUAL PORT (MISCELLANEOUS) ×2 IMPLANT
DRAPE FLUOR MINI C-ARM 54X84 (DRAPES) ×3 IMPLANT
ELECT REM PT RETURN 9FT ADLT (ELECTROSURGICAL) ×3
ELECTRODE REM PT RTRN 9FT ADLT (ELECTROSURGICAL) ×1 IMPLANT
GAUZE PETRO XEROFOAM 1X8 (MISCELLANEOUS) ×1 IMPLANT
GAUZE SPONGE 4X4 12PLY STRL (GAUZE/BANDAGES/DRESSINGS) ×3 IMPLANT
GAUZE XEROFORM 4X4 STRL (GAUZE/BANDAGES/DRESSINGS) ×2 IMPLANT
GLOVE SURG ORTHO 8.0 STRL STRW (GLOVE) ×9 IMPLANT
GOWN STRL REUS W/ TWL LRG LVL3 (GOWN DISPOSABLE) ×1 IMPLANT
GOWN STRL REUS W/TWL LRG LVL3 (GOWN DISPOSABLE) ×4
GOWN STRL REUS W/TWL LRG LVL4 (GOWN DISPOSABLE) ×1 IMPLANT
GUIDEWARE NON THREAD 1.25X150 (WIRE) ×6
GUIDEWIRE NON THREAD 1.25X150 (WIRE) IMPLANT
HANDLE YANKAUER SUCT BULB TIP (MISCELLANEOUS) ×1 IMPLANT
KIT TURNOVER KIT A (KITS) ×3 IMPLANT
LABEL OR SOLS (LABEL) ×1 IMPLANT
NS IRRIG 1000ML POUR BTL (IV SOLUTION) ×3 IMPLANT
PACK EXTREMITY ARMC (MISCELLANEOUS) ×3 IMPLANT
PAD CAST CTTN 4X4 STRL (SOFTGOODS) IMPLANT
PAD PREP 24X41 OB/GYN DISP (PERSONAL CARE ITEMS) ×3 IMPLANT
PADDING CAST COTTON 4X4 STRL (SOFTGOODS) ×2
PADDING CAST COTTON 6X4 ST (SOFTGOODS) IMPLANT
SCREW CANN S THRD/50 4.0 (Screw) ×4 IMPLANT
SPLINT CAST 1 STEP 5X30 WHT (MISCELLANEOUS) ×1 IMPLANT
SPONGE LAP 18X18 5 PK (GAUZE/BANDAGES/DRESSINGS) ×1 IMPLANT
STAPLER SKIN PROX 35W (STAPLE) ×3 IMPLANT
STOCKINETTE BIAS CUT 6 980064 (GAUZE/BANDAGES/DRESSINGS) ×3 IMPLANT
STOCKINETTE STRL 6IN 960660 (GAUZE/BANDAGES/DRESSINGS) ×3 IMPLANT
SUT VIC AB 2-0 CT1 27 (SUTURE) ×2
SUT VIC AB 2-0 CT1 TAPERPNT 27 (SUTURE) ×1 IMPLANT
SUT VIC AB 3-0 SH 27 (SUTURE) ×2
SUT VIC AB 3-0 SH 27X BRD (SUTURE) ×1 IMPLANT

## 2018-04-02 NOTE — Anesthesia Post-op Follow-up Note (Signed)
Anesthesia QCDR form completed.        

## 2018-04-02 NOTE — H&P (Signed)
PREOPERATIVE H&P  Chief Complaint: left ankle fracture  HPI: Debbie Golden is a 47 y.o. female who presents to the ER last night with a fracture dislocation of the left ankle after stepping in a hole. This was reduced by Cranston Neighborhris Gaines, PAC and splinted.  This is a very unstable fracture and the medial malleolus remains widely displaced. I have discussed surgery vs. Casting and have recommended surgical fixation to avoid arthritic changes later.  She has elected for surgical management. The risks and benefits have been discussed with her as well as post op protocols.   Past Medical History:  Diagnosis Date  . Bipolar disorder (HCC)   . Depression   . Genital herpes   . Obesity   . Weakness    Past Surgical History:  Procedure Laterality Date  . ANKLE FRACTURE SURGERY Right   . GASTRIC BYPASS N/A 2007   Social History   Socioeconomic History  . Marital status: Single    Spouse name: Not on file  . Number of children: 0  . Years of education: 16  . Highest education level: Master's degree (e.g., MA, MS, MEng, MEd, MSW, MBA)  Occupational History  . 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 Financial resource strain: Not on file  . Food insecurity:    Worry: Not on file    Inability: Not on file  . Transportation needs:    Medical: Not on file    Non-medical: Not on file  Tobacco Use  . Smoking status: Never Smoker  . Smokeless tobacco: Never Used  Substance and Sexual Activity  . Alcohol use: Yes    Comment: 3 drinks per week  . Drug use: No  . Sexual activity: Not on file  Lifestyle  . Physical activity:    Days per week: Not on file    Minutes per session: Not on file  . Stress: Not on file  Relationships  . Social connections:    Talks on phone: Not on file    Gets together: Not on file    Attends religious service: Not on file    Active member of club or organization: Not on file    Attends meetings of clubs or organizations: Not on file    Relationship  status: Not on file  Other Topics Concern  . Not on file  Social History Narrative   Lives at home with her fiance, Alinda Moneyony.   Right-handed.   One cup caffeine per day.   Family History  Adopted: Yes   No Known Allergies Prior to Admission medications   Medication Sig Start Date End Date Taking? Authorizing Provider  acetaminophen (TYLENOL) 325 MG tablet Take 2 tablets (650 mg total) by mouth every 6 (six) hours as needed for mild pain (or Fever >/= 101). 01/28/18  Yes Rai, Ripudeep K, MD  Cholecalciferol (VITAMIN D3 PO) Take 4,000 Units by mouth daily.   Yes [provider]  DULoxetine (CYMBALTA) 60 MG capsule Take 1 capsule (60 mg total) by mouth daily. 02/23/18  Yes Levert FeinsteinYan, Yijun, MD  folic acid (FOLVITE) 1 MG tablet Take 1 tablet (1 mg total) by mouth daily. 01/28/18  Yes Rai, Ripudeep K, MD  lamoTRIgine (LAMICTAL) 100 MG tablet Take 100 mg by mouth daily.   Yes [provider]  Multiple Vitamin (MULTIVITAMIN WITH MINERALS) TABS tablet Take 1 tablet by mouth daily. 01/28/18  Yes Rai, Ripudeep K, MD  pyridOXINE (VITAMIN B-6) 100 MG tablet Take 1 tablet (100 mg total) by  mouth daily. 01/28/18  Yes Rai, Ripudeep K, MD  risperiDONE (RISPERDAL) 0.5 MG tablet Take 1 tablet (0.5 mg total) by mouth 2 (two) times daily as needed (hallucinations). 01/28/18  Yes Rai, Ripudeep K, MD  risperiDONE (RISPERDAL) 2 MG tablet Take 1 tablet (2 mg total) by mouth at bedtime. 01/28/18  Yes Rai, Ripudeep K, MD  thiamine 100 MG tablet Take 1 tablet (100 mg total) by mouth daily. 01/28/18  Yes Rai, Ripudeep K, MD  traMADol (ULTRAM) 50 MG tablet Take 1 tablet (50 mg total) by mouth every 8 (eight) hours as needed for moderate pain or severe pain. 01/30/18  Yes Rai, Ripudeep K, MD     Positive ROS: All other systems have been reviewed and were otherwise negative with the exception of those mentioned in the HPI and as above.  Physical Exam: General: Alert, no acute distress Cardiovascular: No pedal edema.  Heart is regular and without murmur.  Respiratory: No cyanosis, no use of accessory musculature. Lungs are clear. GI: No organomegaly, abdomen is soft and non-tender Skin: No lesions in the area of chief complaint Neurologic: Sensation intact distally Psychiatric: Patient is competent for consent with normal mood and affect Lymphatic: No axillary or cervical lymphadenopathy  MUSCULOSKELETAL: Alert and comfortable in bed.  CSM good distally and no swelling in toes. Skin intact per Cranston Neighbor.  No other injuries noted.  Assessment: left ankle fracture dislocation with displaced medial malleolus  Plan: Plan for Procedure(s): OPEN REDUCTION INTERNAL FIXATION (ORIF) ANKLE FRACTURE  The risks benefits and alternatives were discussed with the patient including but not limited to the risks of nonoperative treatment, versus surgical intervention including infection, bleeding, nerve injury,  blood clots, cardiopulmonary complications, morbidity, mortality, among others, and they were willing to proceed.   Valinda Hoar, MD [phone]   04/02/2018 8:08 PM

## 2018-04-02 NOTE — Care Management (Signed)
Patient active with [facility] for PT Notified [place] with [facility] of admission. RNCM will follow for discharge planning needs

## 2018-04-02 NOTE — NC FL2 (Signed)
[place] MEDICAID FL2 LEVEL OF CARE SCREENING TOOL     IDENTIFICATION  Patient Name: Debbie Golden Birthdate: [birth year] Sex: female Admission Date (Current Location): 04/01/2018  [place] and [place] Number:  Chiropodist and Address:  [facility], [address], [place], [place] [address]      Provider Number: [id number]  Attending Physician Name and Address:  Deeann Saint, MD  Relative Name and Phone Number:       Current Level of Care: Hospital Recommended Level of Care: Skilled Nursing Facility Prior Approval Number:    Date Approved/Denied:   PASRR Number: ([id number] expires on 04/23/2018  )  Discharge Plan: SNF    Current Diagnoses: Patient Active Problem List   Diagnosis Date Noted  . Closed left ankle fracture, initial encounter 04/01/2018  . Paresthesia 02/23/2018  . Weakness 02/23/2018  . Gait abnormality 02/23/2018  . Acute encephalopathy   . Alcohol withdrawal syndrome, with delirium (HCC)   . Altered mental status   . Leg weakness, bilateral 01/20/2018  . Obesity 01/20/2018  . Bipolar disorder (HCC) 01/20/2018  . Macrocytic anemia 01/20/2018  . Acute hypokalemia 01/20/2018  . Alcohol use 01/20/2018  . Guillain Barr syndrome (HCC)   . Catatonia associated with another mental disorder 11/10/2016  . Noncompliance 11/10/2016  . Involuntary commitment 11/10/2016  . GERD (gastroesophageal reflux disease) 06/25/2015  . Bipolar disorder, curr episode mixed, severe, with psychotic features (HCC) 06/22/2015  . Bipolar disorder (manic depression) (HCC) 06/21/2015  . Hypokalemia 06/21/2015  . Delirium, acute 06/19/2015  . Major depression 06/19/2015  . Alcohol abuse 06/19/2015    Orientation RESPIRATION BLADDER Height & Weight     Self, Time, Situation, Place  Normal Continent Weight: 214 lb (97.1 kg) Height:  5\' 7"  (170.2 cm)  BEHAVIORAL SYMPTOMS/MOOD NEUROLOGICAL BOWEL NUTRITION STATUS      Continent Diet(Diet:  NPO for surgery to be advanced. )  AMBULATORY STATUS COMMUNICATION OF NEEDS Skin   Extensive Assist Verbally Surgical wounds                       Personal Care Assistance Level of Assistance  Bathing, Feeding, Dressing Bathing Assistance: Limited assistance Feeding assistance: Independent Dressing Assistance: Limited assistance     Functional Limitations Info  Sight, Hearing, Speech Sight Info: Adequate Hearing Info: Adequate Speech Info: Adequate    SPECIAL CARE FACTORS FREQUENCY  PT (By licensed PT), OT (By licensed OT)     PT Frequency: (5) OT Frequency: (5)            Contractures      Additional Factors Info  Code Status, Allergies Code Status Info: (Full Code. ) Allergies Info: (No Known Allergies. )           Current Medications (04/02/2018):  This is the current hospital active medication list Current Facility-Administered Medications  Medication Dose Route Frequency Provider Last Rate Last Dose  . 0.9 %  sodium chloride infusion   Intravenous Continuous Deeann Saint, MD 75 mL/hr at 04/02/18 [phone]    . acetaminophen (TYLENOL) tablet 650 mg  650 mg Oral Q6H PRN Deeann Saint, MD      . ceFAZolin (ANCEF) IVPB 2g/100 mL premix  2 g Intravenous On Call to OR Deeann Saint, MD      . cholecalciferol (VITAMIN D) tablet 4,000 Units  4,000 Units Oral Daily Deeann Saint, MD      . clindamycin (CLEOCIN) IVPB 600 mg  600 mg Intravenous On Call  to OR Deeann SaintMiller, Howard, MD      . DULoxetine (CYMBALTA) DR capsule 60 mg  60 mg Oral Daily Deeann SaintMiller, Howard, MD   60 mg at 04/02/18 0950  . folic acid (FOLVITE) tablet 1 mg  1 mg Oral Daily Deeann SaintMiller, Howard, MD      . HYDROcodone-acetaminophen (NORCO/VICODIN) 5-325 MG per tablet 1 tablet  1 tablet Oral Q4H PRN Deeann SaintMiller, Howard, MD   1 tablet at 04/02/18 [phone]  . lamoTRIgine (LAMICTAL) tablet 100 mg  100 mg Oral Daily Deeann SaintMiller, Howard, MD   100 mg at 04/02/18 0950  . meloxicam (MOBIC) tablet 15 mg  15 mg Oral Daily Deeann SaintMiller, Howard,  MD   15 mg at 04/02/18 0953  . morphine 2 MG/ML injection 1 mg  1 mg Intravenous Q1H PRN Deeann SaintMiller, Howard, MD   1 mg at 04/02/18 0756  . multivitamin with minerals tablet 1 tablet  1 tablet Oral Daily Deeann SaintMiller, Howard, MD      . mupirocin ointment (BACTROBAN) 2 % 1 application  1 application Nasal BID Deeann SaintMiller, Howard, MD   1 application at 04/02/18 0957  . pyridOXINE (VITAMIN B-6) tablet 100 mg  100 mg Oral Daily Deeann SaintMiller, Howard, MD      . risperiDONE (RISPERDAL) tablet 0.5 mg  0.5 mg Oral BID PRN Deeann SaintMiller, Howard, MD      . risperiDONE (RISPERDAL) tablet 2 mg  2 mg Oral QHS Deeann SaintMiller, Howard, MD   2 mg at 04/01/18 2204  . thiamine (VITAMIN B-1) tablet 100 mg  100 mg Oral Daily Deeann SaintMiller, Howard, MD      . traMADol Janean Sark(ULTRAM) tablet 50 mg  50 mg Oral Q8H PRN Deeann SaintMiller, Howard, MD   50 mg at 04/01/18 2204     Discharge Medications: Please see discharge summary for a list of discharge medications.  Relevant Imaging Results:  Relevant Lab Results:   Additional Information (SSN: [id number])  Marice Angelino, Darleen CrockerBailey M, LCSW

## 2018-04-02 NOTE — Op Note (Signed)
04/02/2018  PATIENT:  Debbie Golden    PRE-OPERATIVE DIAGNOSIS:  left ankle fracture dislocation-bimalleolar  POST-OPERATIVE DIAGNOSIS:  Same  PROCEDURE:  OPEN REDUCTION INTERNAL FIXATION (ORIF) ANKLE FRACTURE- 2 MEDIAL MALLEOLAR SCREWS  SURGEON:  Valinda HoarMILLER,Aamari Strawderman E, MD  ANESTHESIA:   General  TOURNIQUET TIME: 36 MIN  PREOPERATIVE INDICATIONS:  Debbie Golden is a  47 y.o. female with a diagnosis of left ankle fracture who elected for surgical management to minimize the risk for malunion and nonunion and post-traumatic arthritis.    The risks benefits and alternatives were discussed with the patient preoperatively including but not limited to the risks of infection, bleeding, nerve injury, cardiopulmonary complications, the need for revision surgery, the need for hardware removal, among others, and the patient was willing to proceed.  OPERATIVE IMPLANTS:  Two 4.0 mm cannulated screws for the medial malleolus.  OPERATIVE PROCEDURE: The patient was brought to the operating room and placed in the supine position. All bony prominences were padded. General anesthesia was administered. The lower extremity was prepped and draped in the usual sterile fashion. The leg was elevated and exsanguinated and the tourniquet was inflated. Time out was performed.   I turned my attention to the medial malleolus. Incision was made over the medial malleolus and the fracture exposed and held provisionally with a clamp. 2 guidepins were placed for the 4.0 mm cannulated screws and then confirmation of reduction was made with fluoroscopy. I then placed 2  40mm screws which had satisfactory fixation. Fluoroscopy showed good reduction and hardware placement.  The distal fibular fracture was essentially large avulsion was in good position.  I do not feel it was amenable to operative fixation.  The syndesmosis was stressed using live fluoroscopy and found to be stable.   The medial wound was irrigated, and closed with vicryl  and staples. Sponge and needle counts were correct. The wounds were injected with local anesthetic. Sterile gauze was applied followed by a posterior splint. She was awakened and returned to the PACU in stable and satisfactory condition. There were no complications.  Valinda HoarHoward E Amneet Cendejas, MD

## 2018-04-02 NOTE — Transfer of Care (Signed)
Immediate Anesthesia Transfer of Care Note  Patient: Debbie Golden  Procedure(s) Performed: OPEN REDUCTION INTERNAL FIXATION (ORIF) ANKLE FRACTURE (Left )  Patient Location: PACU  Anesthesia Type:General  Level of Consciousness: awake  Airway & Oxygen Therapy: Patient Spontanous Breathing  Post-op Assessment: Report given to RN  Post vital signs: stable  Last Vitals:  Vitals Value Taken Time  BP 128/68 04/02/2018 11:46 PM  Temp 36.3 C 04/02/2018 11:46 PM  Pulse 121 04/02/2018 11:46 PM  Resp 28 04/02/2018 11:46 PM  SpO2 99 % 04/02/2018 11:46 PM  Vitals shown include unvalidated device data.  Last Pain:  Vitals:   04/02/18 2346  TempSrc: Tympanic  PainSc:       Patients Stated Pain Goal: 5 (04/02/18 1822)  Complications: No apparent anesthesia complications

## 2018-04-02 NOTE — Progress Notes (Signed)
Patient verbalizes feeling anxious. Pt states she normally takes 0.5 mg of ativan daily. MD Hyacinth MeekerMiller paged and received verbal orders see MAR.

## 2018-04-02 NOTE — Telephone Encounter (Signed)
I attempted to call patient and her significant other on DPR, Marland Mcalpineony Bilgelow.  I left messages at both numbers.  Provided our number and the call back number for Fayrene FearingJames (physical therapist).  I let them know he is trying to reach her to reschedule PT.  I also called Fayrene [facility]James at [facility] back.  He said he would keep trying to reach her.  She has a pending appt next week that he will keep.  He call us back if he is unable to reach her to start therapy.

## 2018-04-02 NOTE — H&P (Signed)
THE PATIENT WAS SEEN PRIOR TO SURGERY TODAY.  HISTORY, ALLERGIES, HOME MEDICATIONS AND OPERATIVE PROCEDURE WERE REVIEWED. RISKS AND BENEFITS OF SURGERY DISCUSSED WITH PATIENT AGAIN.  NO CHANGES FROM INITIAL HISTORY AND PHYSICAL NOTED.    

## 2018-04-02 NOTE — Progress Notes (Signed)
Clinical Child psychotherapistocial Worker (CSW) presented bed offers to patient. She chose [facility]. Per Lovett CalenderJoseph [facility]ison [facility]   Baker Hughes IncorporatedBailey Plummer Matich, LCSW [phone]

## 2018-04-02 NOTE — Telephone Encounter (Signed)
[facility] to inform us the pt missed her PT visit. Fayrene FearingJames stated appt had been made Wednesday 4/3, Fayrene FearingJames called the pt last night as well as this morning no answer also knocked on pts door several times. Can be reached at [phone]

## 2018-04-02 NOTE — Clinical Social Work Placement (Signed)
   CLINICAL SOCIAL WORK PLACEMENT  NOTE  Date:  04/02/2018  Patient Details  Name: Debbie Golden MRN: [id number] Date of Birth: Oct 21, 1971  Clinical Social Work is seeking post-discharge placement for this patient at the Skilled  Nursing Facility level of care (*CSW will initial, date and re-position this form in  chart as items are completed):  Yes   Patient/family provided with [facility] Clinical Social Work Department's list of facilities offering this level of care within the geographic area requested by the patient (or if unable, by the patient's family).  Yes   Patient/family informed of their freedom to choose among providers that offer the needed level of care, that participate in Medicare, Medicaid or managed care program needed by the patient, have an available bed and are willing to accept the patient.  Yes   Patient/family informed of [facility] ownership interest in [facility] and [facility], as well as of the fact that they are under no obligation to receive care at these facilities.  PASRR submitted to EDS on       PASRR number received on       Existing PASRR number confirmed on 04/02/18(expires on 04/23/18 )     FL2 transmitted to all facilities in geographic area requested by pt/family on 04/02/18     FL2 transmitted to all facilities within larger geographic area on       Patient informed that his/her managed care company has contracts with or will negotiate with certain facilities, including the following:            Patient/family informed of bed offers received.  Patient chooses bed at       Physician recommends and patient chooses bed at      Patient to be transferred to   on  .  Patient to be transferred to facility by       Patient family notified on   of transfer.  Name of family member notified:        PHYSICIAN       Additional Comment:    _______________________________________________ Debbie Golden, Debbie CrockerBailey M, LCSW 04/02/2018, 11:34  AM

## 2018-04-02 NOTE — Clinical Social Work Note (Signed)
Clinical Social Work Assessment  Patient Details  Name: Debbie Golden MRN: [id number] Date of Birth: 23-Dec-1971  Date of referral:  04/02/18               Reason for consult:  Facility Placement                Permission sought to share information with:  Chartered certified accountant granted to share information::  Yes, Verbal Permission Granted  Name::      Debbie::   Golden   Relationship::     Contact Information:     Housing/Transportation Living arrangements for the past 2 months:  [place] of Information:  Patient, Facility Patient Interpreter Needed:  None Criminal Activity/Legal Involvement Pertinent to Current Situation/Hospitalization:  No - Comment as needed Significant Relationships:  Significant Other Lives with:  Significant Other Do you feel safe going back to the place where you live?  Yes Need for family participation in patient care:  Yes (Comment)  Care giving concerns:  Patient lives in [place] with her fiance Debbie Golden.    Social Worker assessment / plan:  Holiday representative (CSW) reviewed chart and noted that patient has an ankle fracture. Surgery and PT are pending. CSW met with patient alone at bedside to discuss D/C plan. Patient was alert and oriented X4 and was laying in the bed. Patient reported that she lives with her fiance Debbie Golden in [place] and recently went to [facility] for short term rehab. Patient reported that she would like to go back to [facility] after this hospitalization if her insurance will approve it. Patient reported that she will have a hard time getting around at home. FL2 complete and faxed out.   Per Broadus John [facility] liaison patient was at [facility] from 02/01/18 to 03/23/18. Per Broadus John he will review referral and ger back to CSW. Patient has an existing PASARR that expires on 04/23/18. CSW will continue to follow and assist as needed.   Employment status:  Disabled  (Comment on whether or not currently receiving Disability) Insurance information:  Managed Care PT Recommendations:  Not assessed at this time Information / Referral to community resources:  [place]  Patient/Family's Response to care:  Patient requested to go to [facility].   Patient/Family's Understanding of and Emotional Response to Diagnosis, Current Treatment, and Prognosis:  Patient was very pleasant and thanked CSW for assistance.   Emotional Assessment Appearance:  Appears stated age Attitude/Demeanor/Rapport:    Affect (typically observed):  Accepting, Adaptable, Pleasant Orientation:  Oriented to Self, Oriented to Place, Oriented to  Time, Oriented to Situation Alcohol / Substance use:  Not Applicable Psych involvement (Current and /or in the community):  No (Comment)  Discharge Needs  Concerns to be addressed:  Discharge Planning Concerns Readmission within the last 30 days:  No Current discharge risk:  Dependent with Mobility Barriers to Discharge:  Continued Medical Work up   [employer], Debbie Beets, LCSW 04/02/2018, 11:35 AM

## 2018-04-02 NOTE — Anesthesia Preprocedure Evaluation (Signed)
Anesthesia Evaluation  Patient identified by MRN, date of birth, ID band Patient awake    Reviewed: Allergy & Precautions, NPO status , Patient's Chart, lab work & pertinent test results  History of Anesthesia Complications Negative for: history of anesthetic complications  Airway Mallampati: III       Dental   Pulmonary neg sleep apnea, neg COPD,           Cardiovascular (-) hypertension(-) Past MI and (-) CHF (-) dysrhythmias (-) Valvular Problems/Murmurs     Neuro/Psych neg Seizures Depression Bipolar Disorder    GI/Hepatic Neg liver ROS, GERD  ,(+)     substance abuse  alcohol use,   Endo/Other  neg diabetes  Renal/GU negative Renal ROS     Musculoskeletal   Abdominal   Peds  Hematology   Anesthesia Other Findings   Reproductive/Obstetrics                             Anesthesia Physical Anesthesia Plan  ASA: II and emergent  Anesthesia Plan: General   Post-op Pain Management:    Induction: Intravenous  PONV Risk Score and Plan: 3 and Dexamethasone, Ondansetron and Midazolam  Airway Management Planned: Oral ETT  Additional Equipment:   Intra-op Plan:   Post-operative Plan:   Informed Consent: I have reviewed the patients History and Physical, chart, labs and discussed the procedure including the risks, benefits and alternatives for the proposed anesthesia with the patient or authorized representative who has indicated his/her understanding and acceptance.     Plan Discussed with:   Anesthesia Plan Comments:         Anesthesia Quick Evaluation

## 2018-04-02 NOTE — Anesthesia Procedure Notes (Signed)
Procedure Name: Intubation Performed by: Geraldine Contras, CRNA Pre-anesthesia Checklist: Patient identified, Emergency Drugs available, Suction available, Patient being monitored and Timeout performed Patient Re-evaluated:Patient Re-evaluated prior to induction Oxygen Delivery Method: Circle system utilized Preoxygenation: Pre-oxygenation with 100% oxygen Induction Type: IV induction Ventilation: Mask ventilation without difficulty Laryngoscope Size: Mac and 3 Grade View: Grade I Tube type: Oral Tube size: 7.0 mm Number of attempts: 1 Airway Equipment and Method: Stylet Placement Confirmation: ETT inserted through vocal cords under direct vision,  positive ETCO2 and breath sounds checked- equal and bilateral Secured at: 22 cm Tube secured with: Tape Dental Injury: Teeth and Oropharynx as per pre-operative assessment

## 2018-04-03 LAB — CBC
HCT: 32.7 % — ABNORMAL LOW (ref 35.0–47.0)
HEMOGLOBIN: 10.9 g/dL — AB (ref 12.0–16.0)
MCH: 33 pg (ref 26.0–34.0)
MCHC: 33.3 g/dL (ref 32.0–36.0)
MCV: 99.1 fL (ref 80.0–100.0)
Platelets: 209 10*3/uL (ref 150–440)
RBC: 3.3 MIL/uL — ABNORMAL LOW (ref 3.80–5.20)
RDW: 14 % (ref 11.5–14.5)
WBC: 5.3 10*3/uL (ref 3.6–11.0)

## 2018-04-03 LAB — COMPREHENSIVE METABOLIC PANEL
ALBUMIN: 3 g/dL — AB (ref 3.5–5.0)
ALK PHOS: 166 U/L — AB (ref 38–126)
ALT: 33 U/L (ref 14–54)
ANION GAP: 7 (ref 5–15)
AST: 82 U/L — ABNORMAL HIGH (ref 15–41)
BILIRUBIN TOTAL: 1.2 mg/dL (ref 0.3–1.2)
BUN: 7 mg/dL (ref 6–20)
CO2: 24 mmol/L (ref 22–32)
Calcium: 8.4 mg/dL — ABNORMAL LOW (ref 8.9–10.3)
Chloride: 105 mmol/L (ref 101–111)
Creatinine, Ser: 0.69 mg/dL (ref 0.44–1.00)
GFR calc Af Amer: >60 mL/min (ref >60)
GLUCOSE: 181 mg/dL — AB (ref 65–99)
POTASSIUM: 4.1 mmol/L (ref 3.5–5.1)
Sodium: 136 mmol/L (ref 135–145)
TOTAL PROTEIN: 7 g/dL (ref 6.5–8.1)

## 2018-04-03 MED ORDER — METOCLOPRAMIDE HCL 5 MG/ML IJ SOLN: 5.0000 mg | Freq: Three times a day (TID) | INTRAMUSCULAR | Status: DC | PRN

## 2018-04-03 MED ORDER — METHOCARBAMOL 500 MG PO TABS
500.0000 mg | ORAL_TABLET | Freq: Four times a day (QID) | ORAL | Status: DC | PRN
  Administered 2018-04-03 – 2018-04-04 (×2): 500 mg via ORAL
  Filled 2018-04-03 (×2): qty 1

## 2018-04-03 MED ORDER — BISACODYL 10 MG RE SUPP
10.0000 mg | Freq: Every day | RECTAL | Status: DC | PRN
  Administered 2018-04-05: 10 mg via RECTAL
  Filled 2018-04-03: qty 1

## 2018-04-03 MED ORDER — MAGNESIUM HYDROXIDE 400 MG/5ML PO SUSP
30.0000 mL | Freq: Every day | ORAL | Status: DC | PRN
  Administered 2018-04-05: 30 mL via ORAL
  Filled 2018-04-03: qty 30

## 2018-04-03 MED ORDER — ONDANSETRON HCL 4 MG/2ML IJ SOLN
4.0000 mg | Freq: Four times a day (QID) | INTRAMUSCULAR | Status: DC | PRN
  Filled 2018-04-03: qty 2

## 2018-04-03 MED ORDER — METHOCARBAMOL 1000 MG/10ML IJ SOLN: 500.0000 mg | Freq: Four times a day (QID) | INTRAVENOUS | Status: DC | PRN

## 2018-04-03 MED ORDER — METOCLOPRAMIDE HCL 10 MG PO TABS: 5.0000 mg | ORAL_TABLET | Freq: Three times a day (TID) | ORAL | Status: DC | PRN

## 2018-04-03 MED ORDER — CEFAZOLIN SODIUM-DEXTROSE 2-4 GM/100ML-% IV SOLN
2.0000 g | Freq: Three times a day (TID) | INTRAVENOUS | Status: AC
  Administered 2018-04-03 (×3): 2 g via INTRAVENOUS
  Filled 2018-04-03 (×3): qty 100

## 2018-04-03 MED ORDER — ACETAMINOPHEN 325 MG PO TABS: 325.0000 mg | ORAL_TABLET | Freq: Four times a day (QID) | ORAL | Status: DC | PRN

## 2018-04-03 MED ORDER — CLINDAMYCIN PHOSPHATE 600 MG/50ML IV SOLN
600.0000 mg | Freq: Three times a day (TID) | INTRAVENOUS | Status: AC
  Administered 2018-04-03 (×3): 600 mg via INTRAVENOUS
  Filled 2018-04-03 (×3): qty 50

## 2018-04-03 MED ORDER — FENTANYL CITRATE (PF) 100 MCG/2ML IJ SOLN
INTRAMUSCULAR | Status: AC
  Administered 2018-04-03: 25 ug via INTRAVENOUS
  Filled 2018-04-03: qty 2

## 2018-04-03 MED ORDER — RISPERIDONE 1 MG PO TABS
2.0000 mg | ORAL_TABLET | Freq: Every day | ORAL | Status: DC
  Administered 2018-04-03 – 2018-04-04 (×2): 2 mg via ORAL
  Filled 2018-04-03 (×2): qty 2
  Filled 2018-04-03: qty 1

## 2018-04-03 MED ORDER — RISPERIDONE 0.5 MG PO TABS
0.5000 mg | ORAL_TABLET | Freq: Two times a day (BID) | ORAL | Status: DC | PRN
  Filled 2018-04-03: qty 1

## 2018-04-03 MED ORDER — DULOXETINE HCL 60 MG PO CPEP
60.0000 mg | ORAL_CAPSULE | Freq: Every day | ORAL | Status: DC
  Administered 2018-04-03 – 2018-04-05 (×3): 60 mg via ORAL
  Filled 2018-04-03 (×3): qty 1

## 2018-04-03 MED ORDER — DOCUSATE SODIUM 100 MG PO CAPS
100.0000 mg | ORAL_CAPSULE | Freq: Two times a day (BID) | ORAL | Status: DC
  Administered 2018-04-03 – 2018-04-05 (×5): 100 mg via ORAL
  Filled 2018-04-03 (×5): qty 1

## 2018-04-03 MED ORDER — ONDANSETRON HCL 4 MG PO TABS
4.0000 mg | ORAL_TABLET | Freq: Four times a day (QID) | ORAL | Status: DC | PRN
  Administered 2018-04-04: 4 mg via ORAL
  Filled 2018-04-03: qty 1

## 2018-04-03 MED ORDER — MORPHINE SULFATE (PF) 2 MG/ML IV SOLN
1.0000 mg | INTRAVENOUS | Status: DC | PRN
  Administered 2018-04-03 – 2018-04-05 (×3): 1 mg via INTRAVENOUS
  Filled 2018-04-03 (×3): qty 1

## 2018-04-03 MED ORDER — LAMOTRIGINE 25 MG PO TABS
100.0000 mg | ORAL_TABLET | Freq: Every day | ORAL | Status: DC
  Administered 2018-04-03 – 2018-04-05 (×3): 100 mg via ORAL
  Filled 2018-04-03 (×3): qty 4

## 2018-04-03 MED ORDER — FLEET ENEMA 7-19 GM/118ML RE ENEM: 1.0000 | ENEMA | Freq: Once | RECTAL | Status: DC | PRN

## 2018-04-03 MED ORDER — ENOXAPARIN SODIUM 30 MG/0.3ML ~~LOC~~ SOLN
30.0000 mg | SUBCUTANEOUS | Status: DC
  Administered 2018-04-03 – 2018-04-05 (×3): 30 mg via SUBCUTANEOUS
  Filled 2018-04-03 (×3): qty 0.3

## 2018-04-03 MED ORDER — HYDROCODONE-ACETAMINOPHEN 5-325 MG PO TABS
1.0000 | ORAL_TABLET | ORAL | Status: DC | PRN
  Administered 2018-04-03 (×3): 2 via ORAL
  Administered 2018-04-03: 1 via ORAL
  Administered 2018-04-03 – 2018-04-05 (×10): 2 via ORAL
  Filled 2018-04-03 (×8): qty 2
  Filled 2018-04-03: qty 1
  Filled 2018-04-03 (×5): qty 2

## 2018-04-03 MED ORDER — SODIUM CHLORIDE 0.45 % IV SOLN
INTRAVENOUS | Status: DC
  Administered 2018-04-03: 01:00:00 via INTRAVENOUS

## 2018-04-03 MED ORDER — GABAPENTIN 300 MG PO CAPS
300.0000 mg | ORAL_CAPSULE | Freq: Three times a day (TID) | ORAL | Status: DC
  Administered 2018-04-03 – 2018-04-05 (×7): 300 mg via ORAL
  Filled 2018-04-03 (×7): qty 1

## 2018-04-03 NOTE — Progress Notes (Signed)
Physical Therapy Treatment Patient Details Name: Debbie Golden MRN: [id number] DOB: March 22, 1971 Today's Date: 04/03/2018    History of Present Illness Pt is a 47 y.o. female sustaining L dislocated bimalleolor ankle fx stepping in hole at friends house walking in backyard; pt s/p ORIF L ankle fx with 2 medial malleolar screws 04/02/18.  Of note, pt with recent STR at [facility] 02/01/18-03/23/18.  PMH includes B LE weakness, GB syndrome, catatonia, noncompliance, bipolar disorder, depression, genital herpes, gastric bypass.    PT Comments    Ready for second session today.  Comfortable at rest.  Te edge of bed with min guard.  Once sitting, pt voiced throbbing and general increase in pain.  Unable to sit for long but was able to so LAQ x 10 bilaterally.  Returned to supine in tears.  After supine rest she was able to complete exercises as below.  Remains motivated and encouragement given.   Follow Up Recommendations  SNF     Equipment Recommendations  Rolling walker with 5" wheels;Wheelchair (measurements PT);Wheelchair cushion (measurements PT)    Recommendations for Other Services OT consult     Precautions / Restrictions Precautions Precautions: Fall Precaution Comments: Elevate operative extremity Required Braces or Orthoses: Other Brace/Splint Other Brace/Splint: L ankle splint Restrictions Weight Bearing Restrictions: Yes LLE Weight Bearing: Non weight bearing Other Position/Activity Restrictions: Bedrest with bathroom privileges    Mobility  Bed Mobility Overal bed mobility: Needs Assistance Bed Mobility: Supine to Sit;Sit to Supine     Supine to sit: Min guard Sit to supine: Min assist   General bed mobility comments: assist for L LE supine to/from sit; pt using bed rails B and R LE to scoot up in bed end of session with bed flat (assist for L LE NWB'ing status)  Transfers Overall transfer level: Needs assistance Equipment used: Rolling walker (2 wheeled);None Transfers: Sit  to/from Stand;Lateral/Scoot Transfers Sit to Stand: Total assist        Lateral/Scoot Transfers: (pt able to scoot minimally on edge of bed to R and L with max cueing for technique and maintaining NWB'ing L LE status) General transfer comment: deferred this session due to pain/throbbing and poor sitting tolerance  Ambulation/Gait             General Gait Details: not appropriate at this time d/t assist levels required for transfers   Stairs            Wheelchair Mobility    Modified Rankin (Stroke Patients Only)       Balance Overall balance assessment: Needs assistance Sitting-balance support: No upper extremity supported Sitting balance-Leahy Scale: Good Sitting balance - Comments: steady sitting reaching within BOS   Standing balance support: (unable to achieve standing to assess)                                Cognition Arousal/Alertness: Awake/alert Behavior During Therapy: WFL for tasks assessed/performed Overall Cognitive Status: Within Functional Limits for tasks assessed                                        Exercises Other Exercises Other Exercises: Reviewed and return demo of HEP including wiggling toes, ankle pumps RLE, SLR, Ab/add heel slides bialterally    General Comments General comments (skin integrity, edema, etc.): L LE elevated with splint and bandages in place  upon PT entry      Pertinent Vitals/Pain Pain Assessment: 0-10 Pain Score: 10-Worst pain ever Pain Location: L ankle - when sitting EOB Pain Descriptors / Indicators: Throbbing Pain Intervention(s): Limited activity within patient's tolerance;Ice applied;Repositioned    Home Living Family/patient expects to be discharged to:: Private residence Living Arrangements: Spouse/significant other(Pt's fiance Alinda Moneyony) Available Help at Discharge: Friend(s);Available PRN/intermittently Type of Home: House Home Access: Stairs to enter   Home Layout: Two  level;Bed/bath upstairs;1/2 bath on main level;Other (Comment)(can sleep on couch on main level) Home Equipment: Walker - 2 wheels      Prior Function Level of Independence: Independent with assistive device(s)      Comments: Ambulates no AD short distances but uses RW for longer distances.  Recently receiving HHPT.   PT Goals (current goals can now be found in the care plan section) Acute Rehab PT Goals Patient Stated Goal: to improve mobility PT Goal Formulation: With patient Time For Goal Achievement: 04/17/18 Potential to Achieve Goals: Good Additional Goals Additional Goal #1: Pt able to perform manual w/c mobility 150 feet modified independently safely. Progress towards PT goals: Progressing toward goals    Frequency    BID      PT Plan      Co-evaluation              AM-PAC PT "6 Clicks" Daily Activity  Outcome Measure  Difficulty turning over in bed (including adjusting bedclothes, sheets and blankets)?: A Little Difficulty moving from lying on back to sitting on the side of the bed? : Unable Difficulty sitting down on and standing up from a chair with arms (e.g., wheelchair, bedside commode, etc,.)?: Unable Help needed moving to and from a bed to chair (including a wheelchair)?: Total Help needed walking in hospital room?: Total Help needed climbing 3-5 steps with a railing? : Total 6 Click Score: 8    End of Session Equipment Utilized During Treatment: Gait belt;Other (comment)(L LE splint) Activity Tolerance: Patient limited by fatigue;Patient limited by pain Patient left: in bed;with call bell/phone within reach;with bed alarm set;Other (comment) Nurse Communication: Mobility status;Precautions;Weight bearing status;Other (comment)(Pain status) PT Visit Diagnosis: Other abnormalities of gait and mobility (R26.89);Muscle weakness (generalized) (M62.81);History of falling (Z91.81);Difficulty in walking, not elsewhere classified (R26.2);Pain Pain -  Right/Left: Left Pain - part of body: Ankle and joints of foot     Time: [id number] PT Time Calculation (min) (ACUTE ONLY): 20 min  Charges:  $Therapeutic Exercise: 8-22 mins $Therapeutic Activity: 23-37 mins                    G Codes:       Danielle DessSarah Haniya Fern, PTA 04/03/18, 3:17 PM

## 2018-04-03 NOTE — Evaluation (Signed)
Physical Therapy Evaluation Patient Details Name: Debbie Golden MRN: [id number] DOB: 26-Aug-1971 Today's Date: 04/03/2018   History of Present Illness  Pt is a 47 y.o. female sustaining L dislocated bimalleolor ankle fx stepping in hole at friends house walking in backyard; pt s/p ORIF L ankle fx with 2 medial malleolar screws 04/02/18.  Of note, pt with recent STR at [facility] 02/01/18-03/23/18.  PMH includes B LE weakness, GB syndrome, catatonia, noncompliance, bipolar disorder, depression, genital herpes, gastric bypass.  Clinical Impression  Prior to hospital admission, pt was ambulatory short distances without AD but used RW for longer distances (was receiving HHPT).  Pt lives in 2 level home with bedroom and shower on 2nd floor (couch and 1/2 bath main level).  Currently pt is min assist with bed mobility and pt unable to stand with 1 assist and use of RW; lateral scooting edge of bed also difficulty for pt.  Pt with h/o B LE weakness and pt's UE's also demonstrating weakness with activity.  Pt would benefit from skilled PT to address noted impairments and functional limitations (see below for any additional details).  Upon hospital discharge, recommend pt discharge to STR.    Follow Up Recommendations SNF    Equipment Recommendations  Rolling walker with 5" wheels;Wheelchair (measurements PT);Wheelchair cushion (measurements PT)    Recommendations for Other Services OT consult     Precautions / Restrictions Precautions Precautions: Fall Precaution Comments: Elevate operative extremity Required Braces or Orthoses: Other Brace/Splint Other Brace/Splint: L ankle splint Restrictions Weight Bearing Restrictions: Yes LLE Weight Bearing: Non weight bearing Other Position/Activity Restrictions: Bedrest with bathroom privileges      Mobility  Bed Mobility Overal bed mobility: Needs Assistance Bed Mobility: Supine to Sit;Sit to Supine     Supine to sit: Min assist;HOB elevated Sit to supine: Min  assist;HOB elevated   General bed mobility comments: assist for L LE supine to/from sit; pt using bed rails B and R LE to scoot up in bed end of session with bed flat (assist for L LE NWB'ing status)  Transfers Overall transfer level: Needs assistance Equipment used: Rolling walker (2 wheeled);None Transfers: Sit to/from Stand;Lateral/Scoot Transfers Sit to Stand: Total assist        Lateral/Scoot Transfers: (pt able to scoot minimally on edge of bed to R and L with max cueing for technique and maintaining NWB'ing L LE status) General transfer comment: x3 trials sit to stand; pt requiring cueing for UE and LE placement and technique; able to maintain NWB'ing L LE status with minimal assist of therapist but pt unable to clear her bottom from bed with 1 assist and use of RW  Ambulation/Gait             General Gait Details: not appropriate at this time d/t assist levels required for transfers  Stairs            Wheelchair Mobility    Modified Rankin (Stroke Patients Only)       Balance Overall balance assessment: Needs assistance Sitting-balance support: No upper extremity supported(single LE supported) Sitting balance-Leahy Scale: Good Sitting balance - Comments: steady sitting reaching within BOS   Standing balance support: (unable to achieve standing to assess)                                 Pertinent Vitals/Pain Pain Assessment: 0-10 Pain Score: 5  Pain Location: L ankle Pain Descriptors / Indicators: Throbbing;Aching;Sore Pain  Intervention(s): Limited activity within patient's tolerance;Monitored during session;Premedicated before session;Repositioned;Ice applied  Vitals (HR and O2 on room air) stable and WFL throughout treatment session.    Home Living Family/patient expects to be discharged to:: Private residence Living Arrangements: Spouse/significant other(Pt's fiance Alinda Moneyony) Available Help at Discharge: Friend(s);Available  PRN/intermittently Type of Home: House Home Access: Stairs to enter   Entrance Stairs-Number of Steps: 8 Home Layout: Two level;Bed/bath upstairs;1/2 bath on main level;Other (Comment)(can sleep on couch on main level) Home Equipment: Walker - 2 wheels      Prior Function Level of Independence: Independent with assistive device(s)         Comments: Ambulates no AD short distances but uses RW for longer distances.  Recently receiving HHPT.     Hand Dominance        Extremity/Trunk Assessment   Upper Extremity Assessment Upper Extremity Assessment: Generalized weakness(4/5 B shoulder flexion, elbow flexion/extension; fair B hand grip; decreased sensation B mid-forearms to hands (h/o peripheral neuropathy))    Lower Extremity Assessment Lower Extremity Assessment: RLE deficits/detail;LLE deficits/detail RLE Deficits / Details: hip flexion 3+/5; knee flexion/extension at least 3+/5; DF at least 3+/5 RLE Sensation: history of peripheral neuropathy LLE Deficits / Details: able to perform L LE SLR with increased effort about 6 inches off of bed; hip flexion at least 3/5 AROM; knee flexion/extension at least 3/5 AROM LLE: Unable to fully assess due to pain;Unable to fully assess due to immobilization LLE Sensation: history of peripheral neuropathy    Cervical / Trunk Assessment Cervical / Trunk Assessment: Normal  Communication   Communication: No difficulties  Cognition Arousal/Alertness: Awake/alert Behavior During Therapy: WFL for tasks assessed/performed Overall Cognitive Status: Within Functional Limits for tasks assessed                                        General Comments General comments (skin integrity, edema, etc.): L LE elevated with splint and bandages in place upon PT entry.  Nursing cleared pt for participation in physical therapy.  Pt agreeable to PT session.  Bruising noted R knee area (and distal/proximal to knee); bruising also noted L knee  area (and proximal to knee; unable to see distal to knee d/t dressings in place).    Exercises  Transfer training   Assessment/Plan    PT Assessment Patient needs continued PT services  PT Problem List Decreased strength;Decreased range of motion;Decreased activity tolerance;Decreased balance;Decreased mobility;Decreased knowledge of use of DME;Decreased knowledge of precautions;Pain       PT Treatment Interventions DME instruction;Gait training;Stair training;Functional mobility training;Therapeutic activities;Therapeutic exercise;Balance training;Patient/family education;Wheelchair mobility training    PT Goals (Current goals can be found in the Care Plan section)  Acute Rehab PT Goals Patient Stated Goal: to improve mobility PT Goal Formulation: With patient Time For Goal Achievement: 04/17/18 Potential to Achieve Goals: Good    Frequency BID   Barriers to discharge Decreased caregiver support      Co-evaluation               AM-PAC PT "6 Clicks" Daily Activity  Outcome Measure Difficulty turning over in bed (including adjusting bedclothes, sheets and blankets)?: A Little Difficulty moving from lying on back to sitting on the side of the bed? : Unable Difficulty sitting down on and standing up from a chair with arms (e.g., wheelchair, bedside commode, etc,.)?: Unable Help needed moving to and from a bed to  chair (including a wheelchair)?: Total Help needed walking in hospital room?: Total Help needed climbing 3-5 steps with a railing? : Total 6 Click Score: 8    End of Session Equipment Utilized During Treatment: Gait belt;Other (comment)(L LE splint) Activity Tolerance: Patient limited by fatigue;Patient limited by pain Patient left: in bed;with call bell/phone within reach;with bed alarm set;Other (comment)(bed in low position; L LE elevated on 2 pillows) Nurse Communication: Mobility status;Precautions;Weight bearing status;Other (comment)(Pain status) PT Visit  Diagnosis: Other abnormalities of gait and mobility (R26.89);Muscle weakness (generalized) (M62.81);History of falling (Z91.81);Difficulty in walking, not elsewhere classified (R26.2);Pain Pain - Right/Left: Left Pain - part of body: Ankle and joints of foot    Time: 1610-9604 PT Time Calculation (min) (ACUTE ONLY): 37 min   Charges:   PT Evaluation $PT Eval Low Complexity: 1 Low PT Treatments $Therapeutic Activity: 23-37 mins   PT G CodesHendricks Limes, PT 04/03/18, 12:24 PM [phone]

## 2018-04-03 NOTE — Anesthesia Postprocedure Evaluation (Signed)
Anesthesia Post Note  Patient: Camie PatienceHolly Sethi  Procedure(s) Performed: OPEN REDUCTION INTERNAL FIXATION (ORIF) ANKLE FRACTURE (Left )  Patient location during evaluation: PACU Anesthesia Type: General Level of consciousness: awake and alert Pain management: pain level controlled Vital Signs Assessment: post-procedure vital signs reviewed and stable Respiratory status: spontaneous breathing and respiratory function stable Cardiovascular status: stable Anesthetic complications: no     Last Vitals:  Vitals:   04/02/18 2344 04/02/18 2346  BP: 123/66 128/68  Pulse: (!) 126 (!) 123  Resp: (!) 34 (!) 25  Temp: (!) 36.3 C (!) 36.3 C  SpO2: 99% 99%    Last Pain:  Vitals:   04/03/18 0010  TempSrc:   PainSc: (P) 0-No pain                 Yaniel Limbaugh K

## 2018-04-03 NOTE — Progress Notes (Signed)
Alert and oriented. Pt states she can't use her hands because she is weak, Medicated for pain with minimal relief. Dsg dry and intact. Left leg elevated.

## 2018-04-03 NOTE — Progress Notes (Signed)
Report was given at 0052.

## 2018-04-03 NOTE — Progress Notes (Signed)
Subjective:  Patient reports pain as moderate.    Objective:   VITALS:   Vitals:   04/03/18 0128 04/03/18 0229 04/03/18 0328 04/03/18 0741  BP: (!) 142/95 139/84 113/81 (!) 132/92  Pulse: (!) 108 (!) 112 (!) 105 93  Resp: 18 19 18    Temp: (!) 97.5 F (36.4 C) 97.9 F (36.6 C) 98.2 F (36.8 C) 97.9 F (36.6 C)  TempSrc: Oral Oral Oral Oral  SpO2: 99% 100% 100% 100%  Weight:      Height:        PHYSICAL EXAM:  Sensation intact distally Compartment soft splint is clean, dry and intact  LABS  Results for orders placed or performed during the hospital encounter of 04/01/18 (from the past 24 hour(s))  Comprehensive metabolic panel     Status: Abnormal   Collection Time: 04/03/18  1:46 AM  Result Value Ref Range   Sodium 136 135 - 145 mmol/L   Potassium 4.1 3.5 - 5.1 mmol/L   Chloride 105 101 - 111 mmol/L   CO2 24 22 - 32 mmol/L   Glucose, Bld 181 (H) 65 - 99 mg/dL   BUN 7 6 - 20 mg/dL   Creatinine, Ser 1.61 0.44 - 1.00 mg/dL   Calcium 8.4 (L) 8.9 - 10.3 mg/dL   Total Protein 7.0 6.5 - 8.1 g/dL   Albumin 3.0 (L) 3.5 - 5.0 g/dL   AST 82 (H) 15 - 41 U/L   ALT 33 14 - 54 U/L   Alkaline Phosphatase 166 (H) 38 - 126 U/L   Total Bilirubin 1.2 0.3 - 1.2 mg/dL   GFR calc non Af Amer >60 >60 mL/min   GFR calc Af Amer >60 >60 mL/min   Anion gap 7 5 - 15  CBC     Status: Abnormal   Collection Time: 04/03/18  1:46 AM  Result Value Ref Range   WBC 5.3 3.6 - 11.0 K/uL   RBC 3.30 (L) 3.80 - 5.20 MIL/uL   Hemoglobin 10.9 (L) 12.0 - 16.0 g/dL   HCT 09.6 (L) 04.5 - 40.9 %   MCV 99.1 80.0 - 100.0 fL   MCH 33.0 26.0 - 34.0 pg   MCHC 33.3 32.0 - 36.0 g/dL   RDW 81.1 91.4 - 78.2 %   Platelets 209 150 - 440 K/uL    Dg Ankle Complete Left  Result Date: 04/01/2018 CLINICAL DATA:  Ankle fracture. EXAM: LEFT ANKLE COMPLETE - 3+ VIEW COMPARISON:  Earlier same date FINDINGS: Two views study has fine detail obscured by overlying plaster splint. Fracture dislocation seen on the  previous exam has been reduced in the interval with markedly improved alignment at the tibiotalar joint. Medial malleolar and lateral malleolar fractures again noted. IMPRESSION: Interval closed reduction of the fracture dislocation. Medial malleolar fracture and lateral malleolar fracture fragments evident. No distal tibia posterior lip fracture evident on this exam. Electronically Signed   By: Kennith Center M.D.   On: 04/01/2018 19:17   Dg Ankle Complete Left  Result Date: 04/01/2018 CLINICAL DATA:  Fall with ankle injury EXAM: LEFT ANKLE COMPLETE - 3+ VIEW COMPARISON:  None. FINDINGS: Acute, comminuted and displaced medial malleolar fracture. Posterior dislocation of the talus with respect to the distal tibia. Posteriorly displaced posterior malleolar fracture fragment measuring 16 mm. Probable small fracture fragment off the lateral fibular malleolus. Moderate to large plantar calcaneal spur. IMPRESSION: Fracture dislocation of the ankle; the talus is displaced by about 1 bone with in a posterior direction with respect  to the distal tibia. Acute comminuted and displaced fractures of the medial malleolus and posterior malleolus of the distal tibia. Fibular evaluation limited due to bony overlap, possible small fracture fragment adjacent to the tip of the fibular malleolus on the lateral view. Electronically Signed   By: Jasmine PangKim  Fujinaga M.D.   On: 04/01/2018 18:28    Assessment/Plan: 1 Day Post-Op   Active Problems:   Closed left ankle fracture, initial encounter   Advance diet Up with therapy D/C IV fluids Discharge to SNF on Monday once approved   Lyndle HerrlichJames R Arush Gatliff , MD 04/03/2018, 11:31 AM

## 2018-04-04 LAB — COMPREHENSIVE METABOLIC PANEL
ALT: 25 U/L (ref 14–54)
AST: 52 U/L — AB (ref 15–41)
Albumin: 2.4 g/dL — ABNORMAL LOW (ref 3.5–5.0)
Alkaline Phosphatase: 127 U/L — ABNORMAL HIGH (ref 38–126)
Anion gap: 4 — ABNORMAL LOW (ref 5–15)
BUN: 7 mg/dL (ref 6–20)
CHLORIDE: 106 mmol/L (ref 101–111)
CO2: 28 mmol/L (ref 22–32)
CREATININE: 0.81 mg/dL (ref 0.44–1.00)
Calcium: 8.3 mg/dL — ABNORMAL LOW (ref 8.9–10.3)
GFR calc Af Amer: >60 mL/min (ref >60)
GLUCOSE: 97 mg/dL (ref 65–99)
Potassium: 3.2 mmol/L — ABNORMAL LOW (ref 3.5–5.1)
Sodium: 138 mmol/L (ref 135–145)
Total Bilirubin: 0.6 mg/dL (ref 0.3–1.2)
Total Protein: 5.8 g/dL — ABNORMAL LOW (ref 6.5–8.1)

## 2018-04-04 MED ORDER — TRAMADOL HCL 50 MG PO TABS
50.0000 mg | ORAL_TABLET | Freq: Four times a day (QID) | ORAL | Status: DC | PRN
  Administered 2018-04-04 – 2018-04-05 (×5): 50 mg via ORAL
  Filled 2018-04-04 (×5): qty 1

## 2018-04-04 MED ORDER — POTASSIUM CHLORIDE CRYS ER 20 MEQ PO TBCR
20.0000 meq | EXTENDED_RELEASE_TABLET | Freq: Two times a day (BID) | ORAL | Status: DC
  Administered 2018-04-04 – 2018-04-05 (×2): 20 meq via ORAL
  Filled 2018-04-04 (×2): qty 1

## 2018-04-04 NOTE — Progress Notes (Signed)
Patient had a low grade fever. Already getting Norco. Instructed patient on the use of an IS.   Patient has not had a BM this admission. Patient refused MOM, but requested apple juice x 2. Instructed patient that she needs to may a BM before she is discharged.  Patient stated that she understands.  Still wanted to try the apple juice first.

## 2018-04-04 NOTE — Progress Notes (Signed)
Physical Therapy Treatment Patient Details Name: Debbie Golden MRN: [id number] DOB: 03/12/1971 Today's Date: 04/04/2018    History of Present Illness Pt is a 47 y.o. female sustaining L dislocated bimalleolor ankle fx stepping in hole at friends house walking in backyard; pt s/p ORIF L ankle fx with 2 medial malleolar screws 04/02/18.  Of note, pt with recent STR at [facility] 02/01/18-03/23/18.  PMH includes B LE weakness, GB syndrome, catatonia, noncompliance, bipolar disorder, depression, genital herpes, gastric bypass.    PT Comments    Pt attempted to sit on edge of bed but upon pt almost sitting up pt suddenly with significant 10/10 sharp shooting L LE pain and needing to lay back down again (pt received pain medications beginning of session).  Pt assisted back into bed and L LE elevated to attempt to improve pain.  After about 5 minutes rest pt's pain improved and pt requesting to participate in more PT (pt appears motivated to participate in PT and improve functional status).  Pt tolerated LE ex's in bed fairly well.  Pain 7/10 L ankle beginning and end of session (L LE elevated end of session).  Will continue to attempt to progress sitting tolerance and progressive transfers per pt tolerance.    Follow Up Recommendations  SNF     Equipment Recommendations  Rolling walker with 5" wheels;Wheelchair (measurements PT);Wheelchair cushion (measurements PT)    Recommendations for Other Services OT consult     Precautions / Restrictions Precautions Precautions: Fall Precaution Comments: Elevate operative extremity (strict elevation) Required Braces or Orthoses: Other Brace/Splint Other Brace/Splint: L ankle splint Restrictions Weight Bearing Restrictions: Yes LLE Weight Bearing: Non weight bearing Other Position/Activity Restrictions: Bedrest with bathroom privileges    Mobility  Bed Mobility Overal bed mobility: Needs Assistance Bed Mobility: Supine to Sit;Sit to Supine     Supine to sit:  Min assist Sit to supine: Min assist   General bed mobility comments: assist for L LE supine to/from sit; almost came to sitting position but had to lay back down again d/t significant L LE pain  Transfers                 General transfer comment: deferred d/t significant L LE pain when attempting to sit on edge of bed  Ambulation/Gait                 Stairs            Wheelchair Mobility    Modified Rankin (Stroke Patients Only)       Balance                                            Cognition Arousal/Alertness: Awake/alert Behavior During Therapy: WFL for tasks assessed/performed Overall Cognitive Status: Within Functional Limits for tasks assessed                                        Exercises Total Joint Exercises Ankle Circles/Pumps: AROM;Strengthening;Right;10 reps;Supine Quad Sets: AROM;Strengthening;Both;10 reps;Supine Gluteal Sets: AROM;Strengthening;Both;10 reps;Supine Short Arc Quad: AROM;Right;AAROM;Left;10 reps;Supine;Strengthening Heel Slides: AROM;Right;AAROM;Left;10 reps;Supine;Strengthening Straight Leg Raises: AAROM;Strengthening;Left;10 reps;Supine    General Comments General comments (skin integrity, edema, etc.): L LE elevated with splint and bandages in place upon PT entry. Nursing cleared pt for participation in physical therapy.  Pt agreeable to PT session.     Pertinent Vitals/Pain Pain Assessment: 0-10 Pain Score: 7  Pain Location: L ankle Pain Descriptors / Indicators: Aching(sharp/shooting/throbbing when attempting to sit on edge of bed; aching in bed) Pain Intervention(s): Limited activity within patient's tolerance;Monitored during session;Repositioned;RN gave pain meds during session;Ice applied(RN gave pain meds beginning of session)     Home Living                      Prior Function            PT Goals (current goals can now be found in the care plan section)  Acute Rehab PT Goals Patient Stated Goal: to improve mobility PT Goal Formulation: With patient Time For Goal Achievement: 04/17/18 Potential to Achieve Goals: Good Additional Goals Additional Goal #1: Pt able to perform manual w/c mobility 150 feet modified independently safely. Progress towards PT goals: Progressing toward goals    Frequency    BID      PT Plan Current plan remains appropriate    Co-evaluation              AM-PAC PT "6 Clicks" Daily Activity  Outcome Measure  Difficulty turning over in bed (including adjusting bedclothes, sheets and blankets)?: A Little Difficulty moving from lying on back to sitting on the side of the bed? : Unable Difficulty sitting down on and standing up from a chair with arms (e.g., wheelchair, bedside commode, etc,.)?: Unable Help needed moving to and from a bed to chair (including a wheelchair)?: Total Help needed walking in hospital room?: Total Help needed climbing 3-5 steps with a railing? : Total 6 Click Score: 8    End of Session Equipment Utilized During Treatment: Other (comment)(L LE splint) Activity Tolerance: Patient limited by pain Patient left: in bed;with call bell/phone within reach;with bed alarm set(L LE elevated via 2 pillows and bed tilted backwards to elevate L LE more) Nurse Communication: Mobility status;Precautions;Weight bearing status;Other (comment)(Pt's pain status during and end of session) PT Visit Diagnosis: Other abnormalities of gait and mobility (R26.89);Muscle weakness (generalized) (M62.81);History of falling (Z91.81);Difficulty in walking, not elsewhere classified (R26.2);Pain Pain - Right/Left: Left Pain - part of body: Ankle and joints of foot     Time: 1351-1420 PT Time Calculation (min) (ACUTE ONLY): 29 min  Charges:  $Therapeutic Exercise: 23-37 mins                    G CodesHendricks Limes:      Debbie Golden, PT 04/04/18, 2:38 PM [phone]

## 2018-04-04 NOTE — Progress Notes (Signed)
  Subjective:  Patient reports pain as moderate. Leg is not elevated this morning.  Objective:   VITALS:   Vitals:   04/03/18 1612 04/03/18 2013 04/04/18 0007 04/04/18 0836  BP: 127/82 115/68 (!) 97/56 108/75  Pulse: (!) 107 (!) 106 100 99  Resp:  17 18   Temp: 99.3 F (37.4 C) 98.4 F (36.9 C) 98.1 F (36.7 C) 98.5 F (36.9 C)  TempSrc: Oral Oral Oral Oral  SpO2: 100% 100% 94% 97%  Weight:      Height:        PHYSICAL EXAM:  Sensation intact distally Splint C/D/I No cellulitis present Compartment soft able to flex and extend toes, good cap refill  LABS  Results for orders placed or performed during the hospital encounter of 04/01/18 (from the past 24 hour(s))  Comprehensive metabolic panel     Status: Abnormal   Collection Time: 04/04/18  3:15 AM  Result Value Ref Range   Sodium 138 135 - 145 mmol/L   Potassium 3.2 (L) 3.5 - 5.1 mmol/L   Chloride 106 101 - 111 mmol/L   CO2 28 22 - 32 mmol/L   Glucose, Bld 97 65 - 99 mg/dL   BUN 7 6 - 20 mg/dL   Creatinine, Ser 7.820.81 0.44 - 1.00 mg/dL   Calcium 8.3 (L) 8.9 - 10.3 mg/dL   Total Protein 5.8 (L) 6.5 - 8.1 g/dL   Albumin 2.4 (L) 3.5 - 5.0 g/dL   AST 52 (H) 15 - 41 U/L   ALT 25 14 - 54 U/L   Alkaline Phosphatase 127 (H) 38 - 126 U/L   Total Bilirubin 0.6 0.3 - 1.2 mg/dL   GFR calc non Af Amer >60 >60 mL/min   GFR calc Af Amer >60 >60 mL/min   Anion gap 4 (L) 5 - 15    No results found.  Assessment/Plan: 2 Days Post-Op   Active Problems:   Closed left ankle fracture, initial encounter   Advance diet Up with therapy D/C IV fluids Discharge to SNF likely tomorrow Add Tramadol to pain regimen Strict elevation when in bed   Lyndle HerrlichJames R Kollyn Lingafelter , MD 04/04/2018, 12:07 PM

## 2018-04-05 ENCOUNTER — Encounter: Payer: Self-pay | Admitting: Specialist

## 2018-04-05 LAB — COMPREHENSIVE METABOLIC PANEL
ALK PHOS: 141 U/L — AB (ref 38–126)
ALT: 27 U/L (ref 14–54)
AST: 66 U/L — ABNORMAL HIGH (ref 15–41)
Albumin: 2.3 g/dL — ABNORMAL LOW (ref 3.5–5.0)
Anion gap: 4 — ABNORMAL LOW (ref 5–15)
BUN: 5 mg/dL — ABNORMAL LOW (ref 6–20)
CALCIUM: 8.3 mg/dL — AB (ref 8.9–10.3)
CO2: 29 mmol/L (ref 22–32)
CREATININE: 0.64 mg/dL (ref 0.44–1.00)
Chloride: 105 mmol/L (ref 101–111)
Glucose, Bld: 103 mg/dL — ABNORMAL HIGH (ref 65–99)
Potassium: 3.6 mmol/L (ref 3.5–5.1)
Sodium: 138 mmol/L (ref 135–145)
Total Bilirubin: 1 mg/dL (ref 0.3–1.2)
Total Protein: 5.8 g/dL — ABNORMAL LOW (ref 6.5–8.1)

## 2018-04-05 MED ORDER — HYDROCODONE-ACETAMINOPHEN 5-325 MG PO TABS: 1.0000 | ORAL_TABLET | ORAL | 0 refills | Status: DC | PRN

## 2018-04-05 MED ORDER — HYDROCODONE-ACETAMINOPHEN 5-325 MG PO TABS: 1.0000 | ORAL_TABLET | Freq: Four times a day (QID) | ORAL | 0 refills | Status: DC | PRN

## 2018-04-05 MED ORDER — BISACODYL 10 MG RE SUPP: 10.0000 mg | Freq: Every day | RECTAL | 0 refills | Status: DC | PRN

## 2018-04-05 MED ORDER — DOCUSATE SODIUM 100 MG PO CAPS: 100.0000 mg | ORAL_CAPSULE | Freq: Two times a day (BID) | ORAL | 0 refills | Status: DC

## 2018-04-05 MED ORDER — MELOXICAM 15 MG PO TABS
15.0000 mg | ORAL_TABLET | Freq: Every day | ORAL | 2 refills | Status: AC
Start: 2018-04-05 — End: 2019-04-05

## 2018-04-05 MED ORDER — GABAPENTIN 400 MG PO CAPS: 400.0000 mg | ORAL_CAPSULE | Freq: Two times a day (BID) | ORAL | 3 refills | Status: DC

## 2018-04-05 MED ORDER — POTASSIUM CHLORIDE CRYS ER 20 MEQ PO TBCR: 20.0000 meq | EXTENDED_RELEASE_TABLET | Freq: Two times a day (BID) | ORAL | 2 refills | Status: DC

## 2018-04-05 MED ORDER — MAGNESIUM HYDROXIDE 400 MG/5ML PO SUSP: 30.0000 mL | Freq: Every day | ORAL | 0 refills | Status: DC | PRN

## 2018-04-05 MED ORDER — GABAPENTIN 300 MG PO CAPS: 300.0000 mg | ORAL_CAPSULE | Freq: Three times a day (TID) | ORAL | 2 refills | Status: DC

## 2018-04-05 MED ORDER — LORAZEPAM 0.5 MG PO TABS: 0.5000 mg | ORAL_TABLET | Freq: Four times a day (QID) | ORAL | 0 refills | Status: DC | PRN

## 2018-04-05 NOTE — Progress Notes (Signed)
Subjective: 3 Days Post-Op Procedure(s) (LRB): OPEN REDUCTION INTERNAL FIXATION (ORIF) ANKLE FRACTURE (Left)    Patient reports pain as mild.  Objective:   VITALS:   Vitals:   04/05/18 0727 04/05/18 1259  BP: 114/66   Pulse: (!) 112 (!) 110  Resp:    Temp: 99.4 F (37.4 C)   SpO2: 96% 98%    Neurologically intact  LABS Recent Labs    04/03/18 0146  HGB 10.9*  HCT 32.7*  WBC 5.3  PLT 209    Recent Labs    04/03/18 0146 04/04/18 0315 04/05/18 0404  NA 136 138 138  K 4.1 3.2* 3.6  BUN 7 7 5*  CREATININE 0.69 0.81 0.64  GLUCOSE 181* 97 103*    No results for input(s): LABPT, INR in the last 72 hours.   Assessment/Plan: 3 Days Post-Op Procedure(s) (LRB): OPEN REDUCTION INTERNAL FIXATION (ORIF) ANKLE FRACTURE (Left)   Up with therapy Discharge to SNF   The patient will be discharged on Norco 5/325 every 6 hours as needed pain he does not need a tramadol prescription.  Does not need hydrocodone 1-2 every 6 hours.

## 2018-04-05 NOTE — Progress Notes (Signed)
Patient is medially stable for D/C today. Per Jomarie LongsJoseph [facility] liaison [facility] still [facility] and they will not accept a 5 day LOG. Social work Physiological scientistmedical advisor approved a 5 day LOG. [facility] today on a 5 day LOG.   RN will call report and arrange EMS for transport. Clinical Child psychotherapistocial Worker (CSW) sent D/C orders to [facility]. Patient is aware of above and understands that if [employer]BS denies SNF then she will have to D/C home from [facility] after 5 days. Patient reported that her fiance Alinda Moneyony is aware of above and requested that CSW not call him. Please reconsult if future social work needs arise. CSW signing off.   Baker Hughes IncorporatedBailey Bennett Vanscyoc, LCSW [phone]

## 2018-04-05 NOTE — Progress Notes (Signed)
Patient was able to have a BM.  [facility].  EMS notified for transport

## 2018-04-05 NOTE — Progress Notes (Signed)
Physical Therapy Treatment Patient Details Name: Debbie Golden MRN: [id number] DOB: [birth year] Today's Date: 04/05/2018    History of Present Illness Pt is a 46 y.o. female sustaining L dislocated bimalleolor ankle fx stepping in hole at friends house walking in backyard; pt s/p ORIF L ankle fx with 2 medial malleolar screws 04/02/18.  Of note, pt with recent STR at [facility] 02/01/18-03/23/18.  PMH includes B LE weakness, GB syndrome, catatonia, noncompliance, bipolar disorder, depression, genital herpes, gastric bypass.    PT Comments    Pt agreeable to PT; reports 10/10 pain L ankle/foot. Pt noted to become quite anxious/tearful with activity (bed mobility and sitting to edge of bed). Pt requires Min A to the edge of bed with cues and increased time to relax/lower LLE to the floor. Pt requires heavy encouragement and emotional support. Attempted lateral scoot transfers with education on UE and RLE use for slight body elevation. Pt able to demonstrate with assist, but difficulty mobilizing to the R with assist to transfer to the chair. Several attempts with little lateral progress overall; pt often yells in pain with attempts stating "I can't". During attempts SW in the room to inform pt of discharge today to skilled nursing facility. Opted to return pt to bed, so that pt may prepare for discharge. Pt to continue rehab efforts at [facility] skilled nursing facility.    Follow Up Recommendations  SNF     Equipment Recommendations  Rolling walker with 5" wheels;Wheelchair (measurements PT);Wheelchair cushion (measurements PT)    Recommendations for Other Services       Precautions / Restrictions Precautions Precautions: Fall Precaution Comments: Elevate operative extremity (strict elevation) Required Braces or Orthoses: Other Brace/Splint Other Brace/Splint: L ankle splint Restrictions Weight Bearing Restrictions: Yes LLE Weight Bearing: Non weight bearing    Mobility  Bed  Mobility Overal bed mobility: Needs Assistance Bed Mobility: Supine to Sit;Sit to Supine     Supine to sit: Min assist Sit to supine: Min assist   General bed mobility comments: Heavy encouragement to initiate movement and increased time to settle LLE in rested position on floor  Transfers Overall transfer level: Needs assistance   Transfers: Lateral/Scoot Transfers          Lateral/Scoot Transfers: Max assist General transfer comment: Attempted lateral scoot with minimal progress to chair. Pt can demonstrate small elevation of body with Mod A, but unable to progress to the right very well to sit in chair. Pt often begins yelling out in pain/crying repeating "I can't" Several attempts  Ambulation/Gait             General Gait Details: Unable to stand   Stairs            Wheelchair Mobility    Modified Rankin (Stroke Patients Only)       Balance   Sitting-balance support: Bilateral upper extremity supported;Feet supported Sitting balance-Leahy Scale: Good                                      Cognition Arousal/Alertness: Awake/alert Behavior During Therapy: WFL for tasks assessed/performed;Anxious Overall Cognitive Status: Within Functional Limits for tasks assessed                                 General Comments: Pt easily becomes very anxious and tearful with attempts at movement. Quick changes  between tearful/anxious and calm with ease of conversation      Exercises      General Comments        Pertinent Vitals/Pain Pain Assessment: 0-10 Pain Score: 10-Worst pain ever Pain Location: L ankle/LE Pain Descriptors / Indicators: Aching;Operative site guarding;Throbbing Pain Intervention(s): Limited activity within patient's tolerance;Monitored during session;Premedicated before session    Home Living                      Prior Function            PT Goals (current goals can now be found in the care  plan section) Progress towards PT goals: Progressing toward goals(slowly)    Frequency    BID      PT Plan Current plan remains appropriate    Co-evaluation              AM-PAC PT "6 Clicks" Daily Activity  Outcome Measure  Difficulty turning over in bed (including adjusting bedclothes, sheets and blankets)?: A Little Difficulty moving from lying on back to sitting on the side of the bed? : Unable Difficulty sitting down on and standing up from a chair with arms (e.g., wheelchair, bedside commode, etc,.)?: Unable Help needed moving to and from a bed to chair (including a wheelchair)?: Total Help needed walking in hospital room?: Total Help needed climbing 3-5 steps with a railing? : Total 6 Click Score: 8    End of Session Equipment Utilized During Treatment: Gait belt Activity Tolerance: Patient limited by pain Patient left: in bed;with call bell/phone within reach;with bed alarm set;Other (comment)(LLE elevated)   PT Visit Diagnosis: Other abnormalities of gait and mobility (R26.89);Muscle weakness (generalized) (M62.81);History of falling (Z91.81);Difficulty in walking, not elsewhere classified (R26.2);Pain Pain - Right/Left: Left Pain - part of body: Ankle and joints of foot     Time: [id number] PT Time Calculation (min) (ACUTE ONLY): 28 min  Charges:  $Therapeutic Activity: 23-37 mins                    G CodesScot Dock:        Hearl Heikes E Sarena Jezek, PTA 04/05/2018, 2:07 PM

## 2018-04-05 NOTE — Progress Notes (Signed)
EMS is here to transport the patient 

## 2018-04-05 NOTE — Discharge Summary (Signed)
Physician Discharge Summary  Patient ID: Debbie Golden MRN: [id number] DOB/AGE: 1971-08-21 47 y.o.  Admit date: 04/01/2018 Discharge date: 04/05/2018  Admission Diagnoses: Bimalleolar fracture dislocation left ankle  Discharge Diagnoses: Same  Active Problems:   Closed left ankle fracture, initial encounter   Discharged Condition: good  Hospital Course: The patient underwent closed reduction of the ankle fracture dislocation in the emergency room.  She underwent open reduction internal fixation of the medial malleolus on Friday, 04/02/2018 without complication.  She made slow progress with physical therapy.  Because she had no help at home was felt that she needed skilled nursing treatment for a while.  She will be discharged when this is available.  She is to be nonweightbearing on the left leg.  Consults: None  Significant Diagnostic Studies: radiology: X-Ray: Satisfactory open reduction internal fixation left ankle  Treatments: antibiotics: Ancef  Discharge Exam: Blood pressure 114/66, pulse (!) 112, temperature 99.4 F (37.4 C), temperature source Oral, resp. rate 16, height 5\' 7"  (1.702 m), weight 97.1 kg (214 lb), SpO2 96 %. Left foot warm with good neurovascular status.  Dressing is dry.  No other injuries.  Disposition: Discharge disposition: 01-Home or Self Care       Discharge Instructions    Call MD for:  persistant nausea and vomiting   Complete by:  As directed    Call MD for:  redness, tenderness, or signs of infection (pain, swelling, redness, odor or green/yellow discharge around incision site)   Complete by:  As directed    Call MD for:  severe uncontrolled pain   Complete by:  As directed    Call MD for:  temperature >100.4   Complete by:  As directed    Diet - low sodium heart healthy   Complete by:  As directed    Discharge instructions   Complete by:  As directed    Elevate operative leg on 2-3 pillows Apply ice as needed RTC as appointed  No weight  on left leg. Enteric-coated aspirin 325 mg twice daily for 6 weeks.   Increase activity slowly   Complete by:  As directed    May walk on heel   Leave dressing on - Keep it clean, dry, and intact until clinic visit   Complete by:  As directed      Allergies as of 04/05/2018   No Known Allergies     Medication List    TAKE these medications   acetaminophen 325 MG tablet Commonly known as:  TYLENOL Take 2 tablets (650 mg total) by mouth every 6 (six) hours as needed for mild pain (or Fever >/= 101).   bisacodyl 10 MG suppository Commonly known as:  DULCOLAX Place 1 suppository (10 mg total) rectally daily as needed for moderate constipation.   docusate sodium 100 MG capsule Commonly known as:  COLACE Take 1 capsule (100 mg total) by mouth 2 (two) times daily.   DULoxetine 60 MG capsule Commonly known as:  CYMBALTA Take 1 capsule (60 mg total) by mouth daily.   folic acid 1 MG tablet Commonly known as:  FOLVITE Take 1 tablet (1 mg total) by mouth daily.   gabapentin 400 MG capsule Commonly known as:  NEURONTIN Take 1 capsule (400 mg total) by mouth 2 (two) times daily.   gabapentin 300 MG capsule Commonly known as:  NEURONTIN Take 1 capsule (300 mg total) by mouth 3 (three) times daily.   HYDROcodone-acetaminophen 5-325 MG tablet Commonly known as:  NORCO Take 1  tablet by mouth every 6 (six) hours as needed.   HYDROcodone-acetaminophen 5-325 MG tablet Commonly known as:  NORCO/VICODIN Take 1-2 tablets by mouth every 4 (four) hours as needed for moderate pain (pain score 4-6).   lamoTRIgine 100 MG tablet Commonly known as:  LAMICTAL Take 100 mg by mouth daily.   LORazepam 0.5 MG tablet Commonly known as:  ATIVAN Take 1 tablet (0.5 mg total) by mouth every 6 (six) hours as needed for anxiety.   magnesium hydroxide 400 MG/5ML suspension Commonly known as:  MILK OF MAGNESIA Take 30 mLs by mouth daily as needed for mild constipation.   meloxicam 15 MG  tablet Commonly known as:  MOBIC Take 1 tablet (15 mg total) by mouth daily.   multivitamin with minerals Tabs tablet Take 1 tablet by mouth daily.   potassium chloride SA 20 MEQ tablet Commonly known as:  K-DUR,KLOR-CON Take 1 tablet (20 mEq total) by mouth 2 (two) times daily.   pyridOXINE 100 MG tablet Commonly known as:  VITAMIN B-6 Take 1 tablet (100 mg total) by mouth daily.   risperiDONE 2 MG tablet Commonly known as:  RISPERDAL Take 1 tablet (2 mg total) by mouth at bedtime.   risperiDONE 0.5 MG tablet Commonly known as:  RISPERDAL Take 1 tablet (0.5 mg total) by mouth 2 (two) times daily as needed (hallucinations).   thiamine 100 MG tablet Take 1 tablet (100 mg total) by mouth daily.   traMADol 50 MG tablet Commonly known as:  ULTRAM Take 1 tablet (50 mg total) by mouth every 8 (eight) hours as needed for moderate pain or severe pain.   VITAMIN D3 PO Take 4,000 Units by mouth daily.            Durable Medical Equipment  (From admission, onward)        Start     Ordered   04/05/18 1320  DME 3-in-1  Once    Comments:  Foot surgery   04/05/18 1320   04/03/18 0053  DME Walker rolling  Once    Question:  Patient needs a walker to treat with the following condition  Answer:  Closed left ankle fracture, initial encounter   04/03/18 0052      Contact information for follow-up providers    Deeann Saint, MD Follow up.   Specialty:  Orthopedic Surgery Contact information: [address] [place] [address] [phone]            Contact information for after-discharge care    Destination    [facility] .   Service:  Skilled Nursing Contact information: [address] [place] [address] [phone]                  Signed: Valinda Hoar 04/05/2018, 1:22 PM

## 2018-04-05 NOTE — Progress Notes (Signed)
OT Cancellation Note  Patient Details Name: Debbie Golden MRN: [id number] DOB: 09-06-1971   Cancelled Treatment:    Reason Eval/Treat Not Completed: Other (comment). Order received, chart reviewed. Upon attempt, pt meeting with social work who requested OT come back later. Will re-attempt OT evaluation at later time as pt is available and medically appropriate.  Richrd PrimeJamie Stiller, MPH, MS, OTR/L ascom [phone] 04/05/18, 11:44 AM

## 2018-04-05 NOTE — Progress Notes (Signed)
OT Cancellation Note  Patient Details Name: Camie PatienceHolly Orantes MRN: [id number] DOB: 1971-08-16   Cancelled Treatment:    Reason Eval/Treat Not Completed: Patient at procedure or test/ unavailable. On 2nd attempt to evaluate, RN with pt giving suppository to help facilitate BM, pt on bed pan. RN notes that once pt has had a BM, she will be calling EMS to transfer to [facility]. Will re-attempt OT evaluation at later date/time as pt is available and medically appropriate.   Richrd PrimeJamie Stiller, MPH, MS, OTR/L ascom [phone] 04/05/18, 3:56 PM

## 2018-04-05 NOTE — Clinical Social Work Placement (Signed)
   CLINICAL SOCIAL WORK PLACEMENT  NOTE  Date:  04/05/2018  Patient Details  Name: Debbie Golden MRN: [id number] Date of Birth: Sep 25, 1971  Clinical Social Work is seeking post-discharge placement for this patient at the Skilled  Nursing Facility level of care (*CSW will initial, date and re-position this form in  chart as items are completed):  Yes   Patient/family provided with [facility] Clinical Social Work Department's list of facilities offering this level of care within the geographic area requested by the patient (or if unable, by the patient's family).  Yes   Patient/family informed of their freedom to choose among providers that offer the needed level of care, that participate in Medicare, Medicaid or managed care program needed by the patient, have an available bed and are willing to accept the patient.  Yes   Patient/family informed of [facility]'s ownership interest in [facility] and [facility], as well as of the fact that they are under no obligation to receive care at these facilities.  PASRR submitted to EDS on       PASRR number received on       Existing PASRR number confirmed on 04/02/18(expires on 04/23/18 )     FL2 transmitted to all facilities in geographic area requested by pt/family on 04/02/18     FL2 transmitted to all facilities within larger geographic area on       Patient informed that his/her managed care company has contracts with or will negotiate with certain facilities, including the following:        Yes   Patient/family informed of bed offers received.  Patient chooses bed at [facility] )     Physician recommends and patient chooses bed at      Patient to be transferred to [facility] ) on 04/05/18.  Patient to be transferred to facility by [facility] )     Patient family notified on 04/05/18 of transfer.  Name of family member notified:  (Per patient her fiance Alinda Moneyony is aware of D/C today. )      PHYSICIAN       Additional Comment:    _______________________________________________ Antia Rahal, Darleen CrockerBailey M, LCSW 04/05/2018, 1:57 PM

## 2018-04-07 ENCOUNTER — Telehealth: Payer: Self-pay | Admitting: Neurology

## 2018-04-07 ENCOUNTER — Inpatient Hospital Stay
Admission: RE | Admit: 2018-04-07 | Discharge: 2018-04-07 | Disposition: A | Discharge status: Home / Self Care | Payer: Self-pay | Source: Ambulatory Visit | Attending: Neurology | Admitting: Neurology

## 2018-04-07 NOTE — Telephone Encounter (Signed)
Provided this information to [facility] to discuss this issue.

## 2018-04-07 NOTE — Telephone Encounter (Signed)
Stacey at [facility] called stating that the IVIG treatment the pt is taking has currently been on a national limit due to shortage. If pt is new to treatment they cannot refill. Misty StanleyStacey can be reached at [phone]

## 2018-04-27 ENCOUNTER — Ambulatory Visit: Payer: BLUE CROSS/BLUE SHIELD | Admitting: Neurology

## 2018-05-18 NOTE — Telephone Encounter (Signed)
Returned call to [place] at [facility] and provided verbal orders per MD.

## 2018-05-18 NOTE — Telephone Encounter (Signed)
Debbie Golden with [facility] calling to get orders for OT 2 times a week for 4 weeks.

## 2018-05-25 ENCOUNTER — Telehealth: Payer: Self-pay | Admitting: Neurology

## 2018-05-25 NOTE — Telephone Encounter (Signed)
James(PT) with [facility] called to inform Dr. Terrace Arabia of a missed PT appt for 5/27 morning. Fayrene Fearing states he had been unable to r/s at this time. Fayrene Fearing can be reached at [phone] with any questions

## 2018-05-25 NOTE — Telephone Encounter (Signed)
noted 

## 2018-06-09 NOTE — Telephone Encounter (Signed)
[facility]ed [facility] to provide verbal orders for OT.

## 2018-06-09 NOTE — Telephone Encounter (Signed)
Debbie Golden [facility] is calling for verbal orders to continue OT 2 times a week for 3 weeks

## 2018-06-14 ENCOUNTER — Encounter: Payer: Self-pay | Admitting: Neurology

## 2018-06-14 ENCOUNTER — Ambulatory Visit (INDEPENDENT_AMBULATORY_CARE_PROVIDER_SITE_OTHER): Payer: BLUE CROSS/BLUE SHIELD | Admitting: Neurology

## 2018-06-14 VITALS — BP 144/83 | HR 112 | Wt 231.5 lb

## 2018-06-14 DIAGNOSIS — R531 Weakness: Secondary | ICD-10-CM

## 2018-06-14 DIAGNOSIS — G6289 Other specified polyneuropathies: Secondary | ICD-10-CM | POA: Diagnosis not present

## 2018-06-14 DIAGNOSIS — G629 Polyneuropathy, unspecified: Secondary | ICD-10-CM | POA: Insufficient documentation

## 2018-06-14 DIAGNOSIS — R269 Unspecified abnormalities of gait and mobility: Secondary | ICD-10-CM | POA: Diagnosis not present

## 2018-06-14 DIAGNOSIS — R202 Paresthesia of skin: Secondary | ICD-10-CM

## 2018-06-14 MED ORDER — GABAPENTIN 300 MG PO CAPS: 300.0000 mg | ORAL_CAPSULE | Freq: Three times a day (TID) | ORAL | 4 refills | Status: DC

## 2018-06-14 MED ORDER — DULOXETINE HCL 60 MG PO CPEP: 60.0000 mg | ORAL_CAPSULE | Freq: Every day | ORAL | 4 refills | Status: DC

## 2018-06-14 NOTE — Progress Notes (Signed)
PATIENT: Debbie Golden DOB: 1971/03/03  Chief Complaint  Patient presents with  . Follow-up    Patient here with husband. Pt reports that she has her good days and bad.      HISTORICAL  Debbie Golden, is a 47 year old female, seen in refer by primary care doctor Rai, Ripudeep for evaluation of extremity weakness, she is accompanied by her fianc Debbie Golden at today's visit, initial evaluation was on February 23, 2018.  In January 2019, she began to have left knee pain, has difficulty bearing weight with her left leg, was seen by orthopedic surgeon, received injection, brace, instead of getting better, her gait abnormality become worse, by January 12, 2018, she noticed increased gait abnormality, numbness tingling from both knee down to her feet, trouble with her balance, few weeks later she also noticed bilateral hands paresthesia, achy pain, to the point of crying.  She was admitted to the hospital on January 22 to [facility], later transferred to [facility], now at the rehabilitation.  Extensive laboratory evaluations in January 2019, normal or negative vitamin B1, magnesium, phosphate, BMP, CBC showed hemoglobin of 8.5, elevated MCV of 124, MCH was 41, CPK was normal at 62,  UDS was positive for benzodiazepine, mild elevated free T4 1.15, B6 was mildly decreased 2.0,BTDHRC B63 845, folic acid, ferritin level was 341  TSH was mildly elevated 4.7  Spinal fluid testing showed elevated WBC 36, lymphocyte was 15%, neutrophilia was 81%, but it was a traumatic tap, pink spinal fluid, with RBC of 13,300, CSF total protein was 30  MRI of the brain in January 2019 was normal,MRI of lumbar spine showed mild degenerative disc disease, no significant foraminal canal stenosis, MRI of the thoracic spine was normal  That she may have significant gait abnormality, difficulty bearing weight, complains could not feel the ground, she denies bowel and bladder  incontinence,  UPDATE March 12 2018: She return for electrodiagnostic study today, there was absent sensory potentials at all from upper and lower extremity nerves tested, but only mildly decreased C map amplitude on the motor nerves tested, she reported a history of long time, and the binge alcohol drink before the symptoms onset in January 2019,  She continue have significant bilateral lower extremity paresthesia, slow worsening bilateral hands paresthesia and pain, difficulty using her hands, continued significant gait abnormality, areflexia, positive Romberg signs,  Laboratory evaluation showed elevated ESR 82, normal C-reactive protein 3.9  UPDATE June 14 2018: She fell suffered left ankle fracture on April 01, 2018 require surgical fixation at [facility], did not have repeat lumbar puncture or did not receive any IVIG treatment,  In Jan 2019, she did have binge drink of alcohol, a bottle of wine every 2 days, now she still drinks daily, but less amount  Her walking has somewhat improved, still has paresthesias of fingertips and feet, bilateral lower extremity swelling  REVIEW OF SYSTEMS: Full 14 system review of systems performed and notable only for as above  ALLERGIES: No Known Allergies  HOME MEDICATIONS: Current Outpatient Medications  Medication Sig Dispense Refill  . acetaminophen (TYLENOL) 325 MG tablet Take 2 tablets (650 mg total) by mouth every 6 (six) hours as needed for mild pain (or Fever >/= 101).    . Cholecalciferol (VITAMIN D3 PO) Take 4,000 Units by mouth daily.    . DULoxetine (CYMBALTA) 60 MG capsule Take 1 capsule (60 mg total) by mouth daily. 30 capsule 12  . folic acid (FOLVITE) 1 MG  tablet Take 1 tablet (1 mg total) by mouth daily. 30 tablet 3  . gabapentin (NEURONTIN) 300 MG capsule Take 1 capsule (300 mg total) by mouth 3 (three) times daily. 60 capsule 2  . gabapentin (NEURONTIN) 400 MG capsule Take 1 capsule (400 mg total) by mouth 2  (two) times daily. 60 capsule 3  . HYDROcodone-acetaminophen (NORCO) 5-325 MG tablet Take 1 tablet by mouth every 6 (six) hours as needed. 40 tablet 0  . HYDROcodone-acetaminophen (NORCO/VICODIN) 5-325 MG tablet Take 1-2 tablets by mouth every 4 (four) hours as needed for moderate pain (pain score 4-6). 30 tablet 0  . lamoTRIgine (LAMICTAL) 100 MG tablet Take 100 mg by mouth daily.    Marland Kitchen LORazepam (ATIVAN) 0.5 MG tablet Take 0.5 mg by mouth every 8 (eight) hours as needed for anxiety.    . meloxicam (MOBIC) 15 MG tablet Take 1 tablet (15 mg total) by mouth daily. 30 tablet 2  . Multiple Vitamin (MULTIVITAMIN WITH MINERALS) TABS tablet Take 1 tablet by mouth daily.    Marland Kitchen pyridOXINE (VITAMIN B-6) 100 MG tablet Take 1 tablet (100 mg total) by mouth daily. 30 tablet 1  . risperiDONE (RISPERDAL) 2 MG tablet Take 1 tablet (2 mg total) by mouth at bedtime. 30 tablet 0  . traMADol (ULTRAM) 50 MG tablet Take 1 tablet (50 mg total) by mouth every 8 (eight) hours as needed for moderate pain or severe pain. 10 tablet 0   No current facility-administered medications for this visit.     PAST MEDICAL HISTORY: Past Medical History:  Diagnosis Date  . Bipolar disorder ([place])   . Depression   . Genital herpes   . Obesity   . Weakness     PAST SURGICAL HISTORY: Past Surgical History:  Procedure Laterality Date  . ANKLE FRACTURE SURGERY Right   . GASTRIC BYPASS N/A 2007  . ORIF ANKLE FRACTURE Left 04/02/2018   Procedure: OPEN REDUCTION INTERNAL FIXATION (ORIF) ANKLE FRACTURE;  Surgeon: Earnestine Leys, MD;  Location: [facility] ORS;  Service: Orthopedics;  Laterality: Left;    FAMILY HISTORY: Family History  Adopted: Yes    SOCIAL HISTORY:  Social History   Socioeconomic History  . Marital status: Single    Spouse name: Not on file  . Number of children: 0  . Years of education: 10  . Highest education level: Master's degree (e.g., MA, MS, MEng, MEd, MSW, MBA)  Occupational History  . Occupation:  Press photographer  Social Needs  . Financial resource strain: Not on file  . Food insecurity:    Worry: Not on file    Inability: Not on file  . Transportation needs:    Medical: Not on file    Non-medical: Not on file  Tobacco Use  . Smoking status: Never Smoker  . Smokeless tobacco: Never Used  Substance and Sexual Activity  . Alcohol use: Yes    Comment: 3 drinks per week  . Drug use: No  . Sexual activity: Not on file  Lifestyle  . Physical activity:    Days per week: Not on file    Minutes per session: Not on file  . Stress: Not on file  Relationships  . Social connections:    Talks on phone: Not on file    Gets together: Not on file    Attends religious service: Not on file    Active member of club or organization: Not on file    Attends meetings of clubs or organizations: Not on file  Relationship status: Not on file  . Intimate partner violence:    Fear of current or ex partner: Not on file    Emotionally abused: Not on file    Physically abused: Not on file    Forced sexual activity: Not on file  Other Topics Concern  . Not on file  Social History Narrative   Lives at home with her fiance, Debbie Golden.   Right-handed.   One cup caffeine per day.     PHYSICAL EXAM   Vitals:   06/14/18 1252  BP: (!) 144/83  Pulse: (!) 112  Weight: 231 lb 8 oz (105 kg)    Not recorded      Body mass index is 36.26 kg/m.  PHYSICAL EXAMNIATION:  Gen: NAD, conversant, well nourised, obese, well groomed                     Cardiovascular: Regular rate rhythm, no peripheral edema, warm, nontender. Eyes: Conjunctivae clear without exudates or hemorrhage Neck: Supple, no carotid bruits. Pulmonary: Clear to auscultation bilaterally   NEUROLOGICAL EXAM:  MENTAL STATUS: Speech:    Speech is normal; fluent and spontaneous with normal comprehension.  Cognition:     Orientation to time, place and person     Normal recent and remote memory     Normal Attention span and  concentration     Normal Language, naming, repeating,spontaneous speech     Fund of knowledge   CRANIAL NERVES: CN II: Visual fields are full to confrontation. Fundoscopic exam is normal with sharp discs and no vascular changes. Pupils are round equal and briskly reactive to light. CN III, IV, VI: extraocular movement are normal. No ptosis. CN V: Facial sensation is intact to pinprick in all 3 divisions bilaterally. Corneal responses are intact.  CN VII: Face is symmetric with normal eye closure and smile. CN VIII: Hearing is normal to rubbing fingers CN IX, X: Palate elevates symmetrically. Phonation is normal. CN XI: Head turning and shoulder shrug are intact CN XII: Tongue is midline with normal movements and no atrophy.  MOTOR: There is no pronator drift of out-stretched arms. Muscle bulk and tone are normal. Muscle strength is normal.  REFLEXES: She is areflexia and symmetric, plantar responses are flexor.  SENSORY: Mildly decreased vibratory sensation at the finger tips, distal leg,  COORDINATION: Rapid alternating movements and fine finger movements are intact. There is no dysmetria on finger-to-nose and heel-knee-shin.    GAIT/STANCE: She needs pushed up to get up from seated position, wide-based, cautious, + Romberg signs  DIAGNOSTIC DATA (LABS, IMAGING, TESTING) - I reviewed patient records, labs, notes, testing and imaging myself where available.   ASSESSMENT AND PLAN  Debbie Golden is a 47 y.o. 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 Ph.D.  [facility] [address] [place], [place] [address] Ph: [phone] Fax: [phone]  CC: Referring Provider

## 2018-06-30 NOTE — Telephone Encounter (Signed)
Returned call to [facility]ie [facility] to provide requested verbal orders.

## 2018-06-30 NOTE — Telephone Encounter (Signed)
Stephanie/[facility] [phone] request OT for 2 x 2 then 1 x 1

## 2018-07-09 ENCOUNTER — Telehealth: Payer: Self-pay | Admitting: *Deleted

## 2018-07-09 MED ORDER — LISINOPRIL 10 MG PO TABS: 10.0000 mg | ORAL_TABLET | Freq: Every day | ORAL | 5 refills | Status: DC

## 2018-07-09 NOTE — Telephone Encounter (Signed)
Received a call from Denny Peonvery who is an occupational therapist with [facility] [phone]).  He is involved with this patient's home therapy and has consistently been getting high blood pressure readings (most recent: 151/108).  She is currently not on any medications for hypertension.  She does not have a PCP but is actively looking for someone.    Dr. Terrace ArabiaYan has been notified of the above report.  Per her vo, start lisinopril 10mg  daily while she is seeking out a PCP.  Prescription sent to the pharmacy.  Both Denny PeonAvery ([facility]) and patient have been notified.

## 2018-07-22 ENCOUNTER — Telehealth: Payer: Self-pay | Admitting: *Deleted

## 2018-07-22 DIAGNOSIS — Z0289 Encounter for other administrative examinations: Secondary | ICD-10-CM

## 2018-07-22 NOTE — Telephone Encounter (Signed)
Pt  [place] and [employer] form on [employer] Corporation[employer]le desk.

## 2018-07-27 ENCOUNTER — Ambulatory Visit: Payer: BLUE CROSS/BLUE SHIELD | Admitting: Neurology

## 2018-07-29 ENCOUNTER — Telehealth: Payer: Self-pay | Admitting: *Deleted

## 2018-07-29 NOTE — Telephone Encounter (Signed)
I re faxed pt [employer] form to Billy Coasticole Davis [phone] and [place] to [phone] attn Art BuffLawonaz Thomas

## 2018-08-04 NOTE — Telephone Encounter (Signed)
Completed and returned to medical records. 

## 2018-09-20 ENCOUNTER — Encounter: Payer: Self-pay | Admitting: Neurology

## 2018-09-20 ENCOUNTER — Ambulatory Visit: Payer: Self-pay | Admitting: Neurology

## 2018-09-20 VITALS — BP 155/94 | HR 105 | Ht 67.0 in | Wt 232.0 lb

## 2018-09-20 DIAGNOSIS — R531 Weakness: Secondary | ICD-10-CM

## 2018-09-20 DIAGNOSIS — G6289 Other specified polyneuropathies: Secondary | ICD-10-CM

## 2018-09-20 DIAGNOSIS — R269 Unspecified abnormalities of gait and mobility: Secondary | ICD-10-CM

## 2018-09-20 NOTE — Progress Notes (Addendum)
PATIENT: Debbie Golden DOB: [birth year]  Chief Complaint  Patient presents with  . Gait Abnormality/Weakness    She is here with her significant other, Debbie Golden.  Her symptoms have mildly improved.  She is able to ambulate with a rolling walker now.       HISTORICAL  Debbie Golden, is a 47 year old female, seen in refer by primary care doctor Debbie Golden, Debbie Golden for evaluation of extremity weakness, she is accompanied by her fianc Debbie Golden at today's visit, initial evaluation was on February 23, 2018.  In January 2019, she began to have left knee pain, has difficulty bearing weight with her left leg, was seen by orthopedic surgeon, received injection, brace, instead of getting better, her gait abnormality become worse, by January 12, 2018, she noticed increased gait abnormality, numbness tingling from both knee down to her feet, trouble with her balance, few weeks later she also noticed bilateral hands paresthesia, achy pain, to the point of crying.  She was admitted to the hospital on January 22 to [facility], later transferred to [facility], now at the rehabilitation.  Extensive laboratory evaluations in January 2019, normal or negative vitamin B1, magnesium, phosphate, BMP, CBC showed hemoglobin of 8.5, elevated MCV of 124, MCH was 41, CPK was normal at 62,  UDS was positive for benzodiazepine, mild elevated free T4 1.15, B6 was mildly decreased 2.6,STMHDQ Q22 979, folic acid, ferritin level was 341  TSH was mildly elevated 4.7  Spinal fluid testing showed elevated WBC 36, lymphocyte was 15%, neutrophilia was 81%, but it was a traumatic tap, pink spinal fluid, with RBC of 13,300, CSF total protein was 30  MRI of the brain in January 2019 was normal,MRI of lumbar spine showed mild degenerative disc disease, no significant foraminal canal stenosis, MRI of the thoracic spine was normal  That she may have significant gait abnormality, difficulty bearing weight,  complains could not feel the ground, she denies bowel and bladder incontinence,  UPDATE March 12 2018: She return for electrodiagnostic study today, there was absent sensory potentials at all from upper and lower extremity nerves tested, but only mildly decreased C map amplitude on the motor nerves tested, she reported a history of long time, and the binge alcohol drink before the symptoms onset in January 2019,  She continue have significant bilateral lower extremity paresthesia, slow worsening bilateral hands paresthesia and pain, difficulty using her hands, continued significant gait abnormality, areflexia, positive Romberg signs,  Laboratory evaluation showed elevated ESR 82, normal C-reactive protein 3.9  UPDATE June 14 2018: She fell suffered left ankle fracture on April 01, 2018 require surgical fixation at [facility], did not have repeat lumbar puncture or did not receive any IVIG treatment,  In Jan 2019, she did have binge drink of alcohol, a bottle of wine every 2 days, now she still drinks daily, but less amount  Her walking has somewhat improved, still has paresthesias of fingertips and feet, bilateral lower extremity swelling  UPDATE Sept 23 2019: She is accompanied by her significant others Debbie Golden at today's clinical, she continue have significant gait abnormality, left ankle has healed, but overall made some progress, she continued to drink at least a bottle of wine every day,   REVIEW OF SYSTEMS: Full 14 system review of systems performed and notable only for anemia, depression, anxiety all the rest of the review of system review negative  ALLERGIES: No Known Allergies  HOME MEDICATIONS: Current Outpatient Medications  Medication Sig Dispense Refill  .  acetaminophen (TYLENOL) 325 MG tablet Take 2 tablets (650 mg total) by mouth every 6 (six) hours as needed for mild pain (or Fever >/= 101).    . Cholecalciferol (VITAMIN D3 PO) Take 4,000 Units by mouth  daily.    . DULoxetine (CYMBALTA) 60 MG capsule Take 1 capsule (60 mg total) by mouth daily. 90 capsule 4  . folic acid (FOLVITE) 1 MG tablet Take 1 tablet (1 mg total) by mouth daily. 30 tablet 3  . gabapentin (NEURONTIN) 300 MG capsule Take 1 capsule (300 mg total) by mouth 3 (three) times daily. 270 capsule 4  . gabapentin (NEURONTIN) 400 MG capsule Take 1 capsule (400 mg total) by mouth 2 (two) times daily. 60 capsule 3  . lamoTRIgine (LAMICTAL) 100 MG tablet Take 100 mg by mouth daily.    Marland Kitchen lisinopril (PRINIVIL,ZESTRIL) 10 MG tablet Take 1 tablet (10 mg total) by mouth daily. 30 tablet 5  . LORazepam (ATIVAN) 0.5 MG tablet Take 0.5 mg by mouth every 8 (eight) hours as needed for anxiety.    . meloxicam (MOBIC) 15 MG tablet Take 1 tablet (15 mg total) by mouth daily. 30 tablet 2  . Multiple Vitamin (MULTIVITAMIN WITH MINERALS) TABS tablet Take 1 tablet by mouth daily.    Marland Kitchen pyridOXINE (VITAMIN B-6) 100 MG tablet Take 1 tablet (100 mg total) by mouth daily. 30 tablet 1  . risperiDONE (RISPERDAL) 2 MG tablet Take 1 tablet (2 mg total) by mouth at bedtime. 30 tablet 0   No current facility-administered medications for this visit.     PAST MEDICAL HISTORY: Past Medical History:  Diagnosis Date  . Bipolar disorder ([place])   . Depression   . Genital herpes   . Obesity   . Weakness     PAST SURGICAL HISTORY: Past Surgical History:  Procedure Laterality Date  . ANKLE FRACTURE SURGERY Right   . GASTRIC BYPASS N/A 2007  . ORIF ANKLE FRACTURE Left 04/02/2018   Procedure: OPEN REDUCTION INTERNAL FIXATION (ORIF) ANKLE FRACTURE;  Surgeon: Debbie Leys, Debbie Golden;  Location: [facility] ORS;  Service: Orthopedics;  Laterality: Left;    FAMILY HISTORY: Family History  Adopted: Yes    SOCIAL HISTORY:  Social History   Socioeconomic History  . Marital status: Single    Spouse name: Not on file  . Number of children: 0  . Years of education: 2  . Highest education level: Master's degree (e.g., MA,  MS, MEng, MEd, MSW, MBA)  Occupational History  . Occupation: Press photographer  Social Needs  . Financial resource strain: Not on file  . Food insecurity:    Worry: Not on file    Inability: Not on file  . Transportation needs:    Medical: Not on file    Non-medical: Not on file  Tobacco Use  . Smoking status: Never Smoker  . Smokeless tobacco: Never Used  Substance and Sexual Activity  . Alcohol use: Yes    Comment: 3 drinks per week  . Drug use: No  . Sexual activity: Not on file  Lifestyle  . Physical activity:    Days per week: Not on file    Minutes per session: Not on file  . Stress: Not on file  Relationships  . Social connections:    Talks on phone: Not on file    Gets together: Not on file    Attends religious service: Not on file    Active member of club or organization: Not on file  Attends meetings of clubs or organizations: Not on file    Relationship status: Not on file  . Intimate partner violence:    Fear of current or ex partner: Not on file    Emotionally abused: Not on file    Physically abused: Not on file    Forced sexual activity: Not on file  Other Topics Concern  . Not on file  Social History Narrative   Lives at home with her fiance, Debbie Golden.   Right-handed.   One cup caffeine per day.     PHYSICAL EXAM   Vitals:   09/20/18 1101  BP: (!) 155/94  Pulse: (!) 105  Weight: 232 lb (105.2 kg)  Height: 5' 7"  (1.702 m)    Not recorded      Body mass index is 36.34 kg/m.  PHYSICAL EXAMNIATION:  Gen: NAD, conversant, well nourised, obese, well groomed                     Cardiovascular: Regular rate rhythm, no peripheral edema, warm, nontender. Eyes: Conjunctivae clear without exudates or hemorrhage Neck: Supple, no carotid bruits. Pulmonary: Clear to auscultation bilaterally   NEUROLOGICAL EXAM:  MENTAL STATUS: Speech:    Speech is normal; fluent and spontaneous with normal comprehension.  Cognition:     Orientation to time,  place and person     Normal recent and remote memory     Normal Attention span and concentration     Normal Language, naming, repeating,spontaneous speech     Fund of knowledge   CRANIAL NERVES: CN II: Visual fields are full to confrontation. Fundoscopic exam is normal with sharp discs and no vascular changes. Pupils are round equal and briskly reactive to light. CN III, IV, VI: extraocular movement are normal. No ptosis. CN V: Facial sensation is intact to pinprick in all 3 divisions bilaterally. Corneal responses are intact.  CN VII: Face is symmetric with normal eye closure and smile. CN VIII: Hearing is normal to rubbing fingers CN IX, X: Palate elevates symmetrically. Phonation is normal. CN XI: Head turning and shoulder shrug are intact CN XII: Tongue is midline with normal movements and no atrophy.  MOTOR: Normal muscle tone and strength  REFLEXES: She is areflexia and symmetric, plantar responses are flexor.  SENSORY: Length. dependent decreased vibratory sensation, pinprick at the finger tips, midshin  COORDINATION: Rapid alternating movements and fine finger movements are intact. There is no dysmetria on finger-to-nose and heel-knee-shin.    GAIT/STANCE: She needs pushed up to get up from seated position, wide-based, unsteady, ataxic gait, with positive Romberg signs  DIAGNOSTIC DATA (LABS, IMAGING, TESTING) - I reviewed patient records, labs, notes, testing and imaging myself where available.   ASSESSMENT AND PLAN  Meerab Maselli is a 47 y.o. 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 She is going to loose her insurance soon, wants to hold off lumbar puncture and potential IVIG treatment at this point.       Debbie Golden, M.D. Ph.D.  [facility] [address] [place], [place] [address] Ph: [phone] Fax: [phone]  CC: Referring Provider

## 2018-09-21 ENCOUNTER — Telehealth: Payer: Self-pay | Admitting: *Deleted

## 2018-09-21 DIAGNOSIS — Z0289 Encounter for other administrative examinations: Secondary | ICD-10-CM

## 2018-09-21 NOTE — Telephone Encounter (Signed)
Paperwork completed and sent back to medical records.

## 2018-09-21 NOTE — Telephone Encounter (Signed)
Pt hartford form on [employer] desk.

## 2018-09-22 NOTE — Telephone Encounter (Signed)
Paperwork faxed to [employer] at [phone].

## 2018-11-01 NOTE — Telephone Encounter (Signed)
Clarification paperwork received and completed by Dr. Terrace Arabia.  Provided to medical records.

## 2018-12-14 ENCOUNTER — Ambulatory Visit: Payer: BLUE CROSS/BLUE SHIELD | Admitting: Neurology

## 2019-01-18 ENCOUNTER — Ambulatory Visit: Payer: Self-pay | Admitting: Neurology

## 2019-02-02 DIAGNOSIS — Z0271 Encounter for disability determination: Secondary | ICD-10-CM

## 2019-06-10 ENCOUNTER — Other Ambulatory Visit: Payer: Self-pay

## 2019-06-10 ENCOUNTER — Encounter: Payer: Self-pay | Admitting: Family Medicine

## 2019-06-10 ENCOUNTER — Ambulatory Visit: Payer: Medicaid Other | Admitting: Family Medicine

## 2019-06-10 VITALS — BP 134/86 | HR 102 | Temp 98.1°F | Resp 16 | Ht 67.0 in | Wt 247.6 lb

## 2019-06-10 DIAGNOSIS — G549 Nerve root and plexus disorder, unspecified: Secondary | ICD-10-CM | POA: Diagnosis not present

## 2019-06-10 DIAGNOSIS — R531 Weakness: Secondary | ICD-10-CM

## 2019-06-10 DIAGNOSIS — Z13220 Encounter for screening for lipoid disorders: Secondary | ICD-10-CM

## 2019-06-10 DIAGNOSIS — F1010 Alcohol abuse, uncomplicated: Secondary | ICD-10-CM

## 2019-06-10 DIAGNOSIS — F3164 Bipolar disorder, current episode mixed, severe, with psychotic features: Secondary | ICD-10-CM

## 2019-06-10 DIAGNOSIS — F419 Anxiety disorder, unspecified: Secondary | ICD-10-CM

## 2019-06-10 DIAGNOSIS — R269 Unspecified abnormalities of gait and mobility: Secondary | ICD-10-CM | POA: Diagnosis not present

## 2019-06-10 DIAGNOSIS — F331 Major depressive disorder, recurrent, moderate: Secondary | ICD-10-CM

## 2019-06-10 DIAGNOSIS — I10 Essential (primary) hypertension: Secondary | ICD-10-CM

## 2019-06-10 DIAGNOSIS — R202 Paresthesia of skin: Secondary | ICD-10-CM | POA: Diagnosis not present

## 2019-06-10 DIAGNOSIS — E559 Vitamin D deficiency, unspecified: Secondary | ICD-10-CM

## 2019-06-10 DIAGNOSIS — D539 Nutritional anemia, unspecified: Secondary | ICD-10-CM

## 2019-06-10 MED ORDER — DULOXETINE HCL 60 MG PO CPEP: 60.0000 mg | ORAL_CAPSULE | Freq: Every day | ORAL | 1 refills | Status: DC

## 2019-06-10 MED ORDER — GABAPENTIN 300 MG PO CAPS: 900.0000 mg | ORAL_CAPSULE | Freq: Two times a day (BID) | ORAL | 1 refills | Status: DC

## 2019-06-10 MED ORDER — RISPERIDONE 2 MG PO TABS: 2.0000 mg | ORAL_TABLET | Freq: Every day | ORAL | 1 refills | Status: DC

## 2019-06-10 MED ORDER — LAMOTRIGINE 100 MG PO TABS: 100.0000 mg | ORAL_TABLET | Freq: Every day | ORAL | 0 refills | Status: DC

## 2019-06-10 MED ORDER — LISINOPRIL 10 MG PO TABS: 10.0000 mg | ORAL_TABLET | Freq: Every day | ORAL | 1 refills | Status: DC

## 2019-06-10 MED ORDER — HYDROXYZINE HCL 25 MG PO TABS: 25.0000 mg | ORAL_TABLET | Freq: Three times a day (TID) | ORAL | 1 refills | Status: DC | PRN

## 2019-06-10 NOTE — Progress Notes (Signed)
Name: Debbie PatienceHolly Pelzer   MRN: [id number]    DOB: 1971-01-02   Date:06/10/2019       Progress Note  Subjective  Chief Complaint  Chief Complaint  Patient presents with  . Establish Care  . 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 Terrace [facility]Yan with [facility]. She has been in and out of hospitalizations and rehab center ([facility]) over the last 2 years due to these issues.  She does not have an "official" diagnosis at this point, and has not been able to return to Dr. Terrace ArabiaYan due to transportation issues. She is currently using a walker which works well for her.  Current symptoms include bilateral hand and foot numbness/pain (worse in the morning), general fatigue.  Taking Cymbalta 60mg  daily.  She is also prescribed Gabapentin - ran out of some of her prescriptions and has been taking dosing as she is able - has been taking 900mg  twice daily - which we will refill today.   Depression and Anxiety, ?Bipolar, Hx ETOH Abuse: Has been going to [facility] [place] for anxiety and depression medications.  There is bipolar noted on her chart, though she states she does not agree with this diagnosis.  She is taking lamictal daily, cymbalta daily, and risperdone at night. She is taking Ativan 0.5mg  - taking once every 2-3 days. She is aware of our office policy regarding the Rx of benzo's.   We will refer to local psychiatrist today.  We will maintain her current regimen except for ativan until she can see psychiatry. Drinking   Anemia: Has had for many years, since childhood.  Does have ongoing fatigue.  No signs or symptoms of bleeding.  Patient Active Problem List   Diagnosis Date Noted  . Peripheral neuropathy 06/14/2018  . Closed left ankle fracture, initial encounter 04/01/2018  . Paresthesia 02/23/2018  . Weakness 02/23/2018  . Gait abnormality 02/23/2018  . Acute encephalopathy   . Alcohol withdrawal syndrome,  with delirium (HCC)   . Altered mental status   . Leg weakness, bilateral 01/20/2018  . Obesity 01/20/2018  . Bipolar disorder (HCC) 01/20/2018  . Macrocytic anemia 01/20/2018  . Acute hypokalemia 01/20/2018  . Alcohol use 01/20/2018  . Guillain Barr syndrome (HCC)   . Catatonia associated with another mental disorder 11/10/2016  . Noncompliance 11/10/2016  . Involuntary commitment 11/10/2016  . GERD (gastroesophageal reflux disease) 06/25/2015  . Bipolar disorder, curr episode mixed, severe, with psychotic features (HCC) 06/22/2015  . Bipolar disorder (manic depression) (HCC) 06/21/2015  . Hypokalemia 06/21/2015  . Delirium, acute 06/19/2015  . Major depression 06/19/2015  . Alcohol abuse 06/19/2015    Past Surgical History:  Procedure Laterality Date  . ANKLE FRACTURE SURGERY Right   . GASTRIC BYPASS N/A 2007  . ORIF ANKLE FRACTURE Left 04/02/2018   Procedure: OPEN REDUCTION INTERNAL FIXATION (ORIF) ANKLE FRACTURE;  Surgeon: Deeann SaintMiller, Howard, MD;  Location: [facility] ORS;  Service: Orthopedics;  Laterality: Left;    Family History  Adopted: Yes    Social History   Socioeconomic History  . Marital status: Single    Spouse name: Not on file  . Number of children: 0  . Years of education: 16  . Highest education level: Master's degree (e.g., MA, MS, MEng, MEd, MSW, MBA)  Occupational History  . 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 Financial resource strain: Somewhat hard  . Food insecurity  Worry: Sometimes true    Inability: Never true  . Transportation needs    Medical: No    Non-medical: Yes  Tobacco Use  . Smoking status: Never Smoker  . Smokeless tobacco: Never Used  Substance and Sexual Activity  . Alcohol use: Yes    Comment: 3 drinks per week  . Drug use: No  . Sexual activity: Not on file  Lifestyle  . Physical activity    Days per week: 0 days    Minutes per session: 0 min  . Stress: Very much  Relationships  . Social connections    Talks  on phone: More than three times a week    Gets together: More than three times a week    Attends religious service: Never    Active member of club or organization: Yes    Attends meetings of clubs or organizations: Never    Relationship status: Living with partner  . Intimate partner violence    Fear of current or ex partner: No    Emotionally abused: No    Physically abused: No    Forced sexual activity: No  Other Topics Concern  . Not on file  Social History Narrative   Lives at home with her fiance, Nicole Kindred.   Right-handed.   One cup caffeine per day.     Current Outpatient Medications:  .  acetaminophen (TYLENOL) 325 MG tablet, Take 2 tablets (650 mg total) by mouth every 6 (six) hours as needed for mild pain (or Fever >/= 101)., Disp: , Rfl:  .  Cholecalciferol (VITAMIN D3 PO), Take 4,000 Units by mouth daily., Disp: , Rfl:  .  DULoxetine (CYMBALTA) 60 MG capsule, Take 1 capsule (60 mg total) by mouth daily., Disp: 90 capsule, Rfl: 4 .  folic acid (FOLVITE) 1 MG tablet, Take 1 tablet (1 mg total) by mouth daily., Disp: 30 tablet, Rfl: 3 .  gabapentin (NEURONTIN) 300 MG capsule, Take 1 capsule (300 mg total) by mouth 3 (three) times daily., Disp: 270 capsule, Rfl: 4 .  gabapentin (NEURONTIN) 400 MG capsule, Take 1 capsule (400 mg total) by mouth 2 (two) times daily., Disp: 60 capsule, Rfl: 3 .  hydrOXYzine (ATARAX/VISTARIL) 25 MG tablet, Take 25 mg by mouth 3 (three) times daily as needed., Disp: , Rfl:  .  lamoTRIgine (LAMICTAL) 100 MG tablet, Take 100 mg by mouth daily., Disp: , Rfl:  .  lisinopril (PRINIVIL,ZESTRIL) 10 MG tablet, Take 1 tablet (10 mg total) by mouth daily., Disp: 30 tablet, Rfl: 5 .  LORazepam (ATIVAN) 0.5 MG tablet, Take 0.5 mg by mouth every 8 (eight) hours as needed for anxiety., Disp: , Rfl:  .  Multiple Vitamin (MULTIVITAMIN WITH MINERALS) TABS tablet, Take 1 tablet by mouth daily., Disp: , Rfl:  .  pyridOXINE (VITAMIN B-6) 100 MG tablet, Take 1 tablet (100  mg total) by mouth daily., Disp: 30 tablet, Rfl: 1 .  risperiDONE (RISPERDAL) 2 MG tablet, Take 1 tablet (2 mg total) by mouth at bedtime., Disp: 30 tablet, Rfl: 0 .  vitamin B-12 (CYANOCOBALAMIN) 500 MCG tablet, Take 500 mcg by mouth daily., Disp: , Rfl:   No Known Allergies  I personally reviewed active problem list, medication list, allergies, notes from last encounter, lab results with the patient/caregiver today.   ROS Constitutional: Negative for fever or weight change.  Respiratory: Negative for cough and shortness of breath.   Cardiovascular: Negative for chest pain or palpitations.  Gastrointestinal: Negative for abdominal pain, no bowel changes.  Musculoskeletal: + for gait problem, no joint swelling.  Skin: Negative for rash.  Neurological: Negative for dizziness or headache.  No other specific complaints in a complete review of systems (except as listed in HPI above).  Objective  Vitals:   06/10/19 0818  BP: 134/86  Pulse: (!) 102  Resp: 16  Temp: 98.1 F (36.7 C)  TempSrc: Oral  SpO2: 94%  Weight: 247 lb 9.6 oz (112.3 kg)  Height: 5\' 7"  (1.702 m)   Body mass index is 38.78 kg/m.  Physical Exam  Constitutional: Patient appears well-developed and well-nourished. No distress.  HENT: Head: Normocephalic and atraumatic. Eyes: Conjunctivae and EOM are normal. No scleral icterus.  Neck: Normal range of motion. Neck supple.  Cardiovascular: Normal rate, regular rhythm and normal heart sounds.  No murmur heard. No BLE edema. Pulmonary/Chest: Effort normal and breath sounds normal. No respiratory distress. Musculoskeletal: Normal range of motion, no joint effusions. No gross deformities Neurological: Pt is alert and oriented to person, place, and time. No cranial nerve deficit. Coordination, balance, strength, speech and gait are normal.  Skin: Skin is warm and dry. No rash noted. No erythema.  Psychiatric: Patient has a normal mood and affect. behavior is normal.  Judgment and thought content normal.  No results found for this or any previous visit (from the past 72 hour(s)).  PHQ2/9: Depression screen PHQ 2/9 06/10/2019  Decreased Interest 1  Down, Depressed, Hopeless 1  PHQ - 2 Score 2  Altered sleeping 0  Tired, decreased energy 1  Change in appetite 0  Feeling bad or failure about yourself  1  Trouble concentrating 0  Moving slowly or fidgety/restless 0  Suicidal thoughts 0  PHQ-9 Score 4  Difficult doing work/chores Somewhat difficult   PHQ-2/9 Result is positive.    Fall Risk: Fall Risk  06/10/2019  Falls in the past year? 0  Number falls in past yr: 0  Injury with Fall? 0    Functional Status Survey: Is the patient deaf or have difficulty hearing?: No Does the patient have difficulty seeing, even when wearing glasses/contacts?: No Does the patient have difficulty concentrating, remembering, or making decisions?: No Does the patient have difficulty walking or climbing stairs?: Yes Does the patient have difficulty dressing or bathing?: Yes Does the patient have difficulty doing errands alone such as visiting a doctor's office or shopping?: Yes   Assessment & Plan  1. Sensory neuronopathy - Ambulatory referral to Neurology - gabapentin (NEURONTIN) 300 MG capsule; Take 3 capsules (900 mg total) by mouth 2 (two) times daily.  Dispense: 180 capsule; Refill: 1  2. Weakness - DULoxetine (CYMBALTA) 60 MG capsule; Take 1 capsule (60 mg total) by mouth daily.  Dispense: 90 capsule; Refill: 1 - Ambulatory referral to Neurology - gabapentin (NEURONTIN) 300 MG capsule; Take 3 capsules (900 mg total) by mouth 2 (two) times daily.  Dispense: 180 capsule; Refill: 1  3. Paresthesia - DULoxetine (CYMBALTA) 60 MG capsule; Take 1 capsule (60 mg total) by mouth daily.  Dispense: 90 capsule; Refill: 1 - Ambulatory referral to Neurology - gabapentin (NEURONTIN) 300 MG capsule; Take 3 capsules (900 mg total) by mouth 2 (two) times daily.   Dispense: 180 capsule; Refill: 1  4. Gait abnormality - DULoxetine (CYMBALTA) 60 MG capsule; Take 1 capsule (60 mg total) by mouth daily.  Dispense: 90 capsule; Refill: 1 - Ambulatory referral to Neurology - gabapentin (NEURONTIN) 300 MG capsule; Take 3 capsules (900 mg total) by mouth 2 (two) times daily.  Dispense: 180 capsule; Refill: 1  5. Moderate episode of recurrent major depressive disorder (HCC) - lamoTRIgine (LAMICTAL) 100 MG tablet; Take 1 tablet (100 mg total) by mouth daily.  Dispense: 90 tablet; Refill: 0 - risperiDONE (RISPERDAL) 2 MG tablet; Take 1 tablet (2 mg total) by mouth at bedtime.  Dispense: 30 tablet; Refill: 1 - hydrOXYzine (ATARAX/VISTARIL) 25 MG tablet; Take 1 tablet (25 mg total) by mouth 3 (three) times daily as needed.  Dispense: 30 tablet; Refill: 1 - Ambulatory referral to Psychiatry  6. Bipolar disorder, current episode mixed, severe, with psychotic features (HCC) - lamoTRIgine (LAMICTAL) 100 MG tablet; Take 1 tablet (100 mg total) by mouth daily.  Dispense: 90 tablet; Refill: 0 - risperiDONE (RISPERDAL) 2 MG tablet; Take 1 tablet (2 mg total) by mouth at bedtime.  Dispense: 30 tablet; Refill: 1 - hydrOXYzine (ATARAX/VISTARIL) 25 MG tablet; Take 1 tablet (25 mg total) by mouth 3 (three) times daily as needed.  Dispense: 30 tablet; Refill: 1 - Ambulatory referral to Psychiatry  7. Anxiety - lamoTRIgine (LAMICTAL) 100 MG tablet; Take 1 tablet (100 mg total) by mouth daily.  Dispense: 90 tablet; Refill: 0 - risperiDONE (RISPERDAL) 2 MG tablet; Take 1 tablet (2 mg total) by mouth at bedtime.  Dispense: 30 tablet; Refill: 1 - hydrOXYzine (ATARAX/VISTARIL) 25 MG tablet; Take 1 tablet (25 mg total) by mouth 3 (three) times daily as needed.  Dispense: 30 tablet; Refill: 1 - Ambulatory referral to Psychiatry  8. Alcohol abuse - Vitamin B1 - Ambulatory referral to Psychiatry - Recommend cessation  9. Macrocytic anemia - CBC with Differential/Platelet - B12 and  Folate Panel - Vitamin B1  10. Vitamin D deficiency - VITAMIN D 25 Hydroxy (Vit-D Deficiency, Fractures)  11. Essential hypertension - lisinopril (ZESTRIL) 10 MG tablet; Take 1 tablet (10 mg total) by mouth daily.  Dispense: 90 tablet; Refill: 1 - COMPLETE METABOLIC PANEL WITH GFR  12. Lipid screening - Lipid panel  We will maintain her current medications - she does bring bag of current Rx's with her for med rec which is performed.  I advised we must refer out to psychiatry due to multiple sedating medications.  She verbalizes understanding.  She also verbalizes understanding of the sedative nature of her medication regimen - she has been on this for many months now, so again, we will maintain while she awaits local specialty referrals.  Doing well overall with her neuropathy, strongly recommend she see neurology for this again ASAP for maintenance.

## 2019-06-13 LAB — COMPLETE METABOLIC PANEL WITH GFR
AG Ratio: 1.2 (calc) (ref 1.0–2.5)
ALT: 16 U/L (ref 6–29)
AST: 26 U/L (ref 10–35)
Albumin: 4.1 g/dL (ref 3.6–5.1)
Alkaline phosphatase (APISO): 149 U/L — ABNORMAL HIGH (ref 31–125)
BUN: 12 mg/dL (ref 7–25)
CO2: 24 mmol/L (ref 20–32)
Calcium: 9.6 mg/dL (ref 8.6–10.2)
Chloride: 104 mmol/L (ref 98–110)
Creat: 0.78 mg/dL (ref 0.50–1.10)
GFR, Est African American: 104 mL/min/{1.73_m2} (ref >=60)
GFR, Est Non African American: 90 mL/min/{1.73_m2} (ref >=60)
Globulin: 3.3 g/dL (calc) (ref 1.9–3.7)
Glucose, Bld: 119 mg/dL — ABNORMAL HIGH (ref 65–99)
Potassium: 4.2 mmol/L (ref 3.5–5.3)
Sodium: 139 mmol/L (ref 135–146)
Total Bilirubin: 0.6 mg/dL (ref 0.2–1.2)
Total Protein: 7.4 g/dL (ref 6.1–8.1)

## 2019-06-13 LAB — LIPID PANEL
Cholesterol: 200 mg/dL — ABNORMAL HIGH (ref <200)
HDL: 76 mg/dL (ref >=50)
LDL Cholesterol (Calc): 105 mg/dL (calc) — ABNORMAL HIGH
Non-HDL Cholesterol (Calc): 124 mg/dL (calc) (ref <130)
Total CHOL/HDL Ratio: 2.6 (calc) (ref <5.0)
Triglycerides: 101 mg/dL (ref <150)

## 2019-06-13 LAB — CBC WITH DIFFERENTIAL/PLATELET
Absolute Monocytes: 370 cells/uL (ref 200–950)
Basophils Absolute: 29 cells/uL (ref 0–200)
Basophils Relative: 0.6 %
Eosinophils Absolute: 269 cells/uL (ref 15–500)
Eosinophils Relative: 5.6 %
HCT: 38.2 % (ref 35.0–45.0)
Hemoglobin: 12.4 g/dL (ref 11.7–15.5)
Lymphs Abs: 1368 cells/uL (ref 850–3900)
MCH: 30.6 pg (ref 27.0–33.0)
MCHC: 32.5 g/dL (ref 32.0–36.0)
MCV: 94.3 fL (ref 80.0–100.0)
MPV: 10.8 fL (ref 7.5–12.5)
Monocytes Relative: 7.7 %
Neutro Abs: 2765 cells/uL (ref 1500–7800)
Neutrophils Relative %: 57.6 %
Platelets: 205 10*3/uL (ref 140–400)
RBC: 4.05 10*6/uL (ref 3.80–5.10)
RDW: 17 % — ABNORMAL HIGH (ref 11.0–15.0)
Total Lymphocyte: 28.5 %
WBC: 4.8 10*3/uL (ref 3.8–10.8)

## 2019-06-13 LAB — B12 AND FOLATE PANEL
Folate: 13 ng/mL
Vitamin B-12: 685 pg/mL (ref 200–1100)

## 2019-06-13 LAB — VITAMIN D 25 HYDROXY (VIT D DEFICIENCY, FRACTURES): Vit D, 25-Hydroxy: 27 ng/mL — ABNORMAL LOW (ref 30–100)

## 2019-06-13 LAB — VITAMIN B1: Vitamin B1 (Thiamine): <6 nmol/L — ABNORMAL LOW (ref 8–30)

## 2019-06-20 ENCOUNTER — Other Ambulatory Visit: Payer: Self-pay | Admitting: Family Medicine

## 2019-06-20 DIAGNOSIS — F331 Major depressive disorder, recurrent, moderate: Secondary | ICD-10-CM

## 2019-06-20 DIAGNOSIS — F3164 Bipolar disorder, current episode mixed, severe, with psychotic features: Secondary | ICD-10-CM

## 2019-06-20 DIAGNOSIS — F419 Anxiety disorder, unspecified: Secondary | ICD-10-CM

## 2019-07-02 ENCOUNTER — Other Ambulatory Visit: Payer: Self-pay | Admitting: Family Medicine

## 2019-07-02 DIAGNOSIS — R531 Weakness: Secondary | ICD-10-CM

## 2019-07-02 DIAGNOSIS — R269 Unspecified abnormalities of gait and mobility: Secondary | ICD-10-CM

## 2019-07-02 DIAGNOSIS — R202 Paresthesia of skin: Secondary | ICD-10-CM

## 2019-07-02 DIAGNOSIS — G549 Nerve root and plexus disorder, unspecified: Secondary | ICD-10-CM

## 2019-07-07 ENCOUNTER — Inpatient Hospital Stay
Admission: EM | Admit: 2019-07-07 | Discharge: 2019-07-12 | DRG: 871 | Disposition: A | Discharge status: Other Acute Inpatient Hospital | Payer: Medicaid Other | Attending: Internal Medicine | Admitting: Internal Medicine

## 2019-07-07 ENCOUNTER — Other Ambulatory Visit: Payer: Self-pay

## 2019-07-07 ENCOUNTER — Emergency Department: Payer: Medicaid Other

## 2019-07-07 DIAGNOSIS — F419 Anxiety disorder, unspecified: Secondary | ICD-10-CM | POA: Diagnosis present

## 2019-07-07 DIAGNOSIS — Z9884 Bariatric surgery status: Secondary | ICD-10-CM

## 2019-07-07 DIAGNOSIS — I10 Essential (primary) hypertension: Secondary | ICD-10-CM | POA: Diagnosis present

## 2019-07-07 DIAGNOSIS — F314 Bipolar disorder, current episode depressed, severe, without psychotic features: Secondary | ICD-10-CM | POA: Diagnosis present

## 2019-07-07 DIAGNOSIS — K219 Gastro-esophageal reflux disease without esophagitis: Secondary | ICD-10-CM | POA: Diagnosis present

## 2019-07-07 DIAGNOSIS — A4151 Sepsis due to Escherichia coli [E. coli]: Principal | ICD-10-CM | POA: Diagnosis present

## 2019-07-07 DIAGNOSIS — R402112 Coma scale, eyes open, never, at arrival to emergency department: Secondary | ICD-10-CM | POA: Diagnosis present

## 2019-07-07 DIAGNOSIS — A419 Sepsis, unspecified organism: Secondary | ICD-10-CM

## 2019-07-07 DIAGNOSIS — N390 Urinary tract infection, site not specified: Secondary | ICD-10-CM

## 2019-07-07 DIAGNOSIS — F05 Delirium due to known physiological condition: Secondary | ICD-10-CM | POA: Diagnosis present

## 2019-07-07 DIAGNOSIS — E669 Obesity, unspecified: Secondary | ICD-10-CM | POA: Diagnosis present

## 2019-07-07 DIAGNOSIS — R402352 Coma scale, best motor response, localizes pain, at arrival to emergency department: Secondary | ICD-10-CM | POA: Diagnosis present

## 2019-07-07 DIAGNOSIS — G629 Polyneuropathy, unspecified: Secondary | ICD-10-CM | POA: Diagnosis present

## 2019-07-07 DIAGNOSIS — R6520 Severe sepsis without septic shock: Secondary | ICD-10-CM | POA: Diagnosis present

## 2019-07-07 DIAGNOSIS — Z20828 Contact with and (suspected) exposure to other viral communicable diseases: Secondary | ICD-10-CM | POA: Diagnosis present

## 2019-07-07 DIAGNOSIS — N179 Acute kidney failure, unspecified: Secondary | ICD-10-CM | POA: Diagnosis present

## 2019-07-07 DIAGNOSIS — Z79899 Other long term (current) drug therapy: Secondary | ICD-10-CM

## 2019-07-07 DIAGNOSIS — F061 Catatonic disorder due to known physiological condition: Secondary | ICD-10-CM

## 2019-07-07 DIAGNOSIS — Z6839 Body mass index (BMI) 39.0-39.9, adult: Secondary | ICD-10-CM

## 2019-07-07 DIAGNOSIS — E860 Dehydration: Secondary | ICD-10-CM | POA: Diagnosis present

## 2019-07-07 DIAGNOSIS — N3090 Cystitis, unspecified without hematuria: Secondary | ICD-10-CM

## 2019-07-07 DIAGNOSIS — R402212 Coma scale, best verbal response, none, at arrival to emergency department: Secondary | ICD-10-CM | POA: Diagnosis present

## 2019-07-07 LAB — CBC WITH DIFFERENTIAL/PLATELET
Abs Immature Granulocytes: 0.02 10*3/uL (ref 0.00–0.07)
Basophils Absolute: 0 10*3/uL (ref 0.0–0.1)
Basophils Relative: 0 %
Eosinophils Absolute: 0.1 10*3/uL (ref 0.0–0.5)
Eosinophils Relative: 3 %
HCT: 36.9 % (ref 36.0–46.0)
Hemoglobin: 11.6 g/dL — ABNORMAL LOW (ref 12.0–15.0)
Immature Granulocytes: 0 %
Lymphocytes Relative: 28 %
Lymphs Abs: 1.3 10*3/uL (ref 0.7–4.0)
MCH: 30.8 pg (ref 26.0–34.0)
MCHC: 31.4 g/dL (ref 30.0–36.0)
MCV: 97.9 fL (ref 80.0–100.0)
Monocytes Absolute: 0.4 10*3/uL (ref 0.1–1.0)
Monocytes Relative: 8 %
Neutro Abs: 2.8 10*3/uL (ref 1.7–7.7)
Neutrophils Relative %: 61 %
Platelets: 169 10*3/uL (ref 150–400)
RBC: 3.77 MIL/uL — ABNORMAL LOW (ref 3.87–5.11)
RDW: 16.9 % — ABNORMAL HIGH (ref 11.5–15.5)
WBC: 4.6 10*3/uL (ref 4.0–10.5)
nRBC: 0 % (ref 0.0–0.2)

## 2019-07-07 LAB — COMPREHENSIVE METABOLIC PANEL
ALT: 20 U/L (ref 0–44)
AST: 30 U/L (ref 15–41)
Albumin: 4 g/dL (ref 3.5–5.0)
Alkaline Phosphatase: 159 U/L — ABNORMAL HIGH (ref 38–126)
Anion gap: 11 (ref 5–15)
BUN: 18 mg/dL (ref 6–20)
CO2: 24 mmol/L (ref 22–32)
Calcium: 9.7 mg/dL (ref 8.9–10.3)
Chloride: 103 mmol/L (ref 98–111)
Creatinine, Ser: 1.32 mg/dL — ABNORMAL HIGH (ref 0.44–1.00)
GFR calc Af Amer: 55 mL/min — ABNORMAL LOW (ref >60)
GFR calc non Af Amer: 48 mL/min — ABNORMAL LOW (ref >60)
Glucose, Bld: 133 mg/dL — ABNORMAL HIGH (ref 70–99)
Potassium: 4.4 mmol/L (ref 3.5–5.1)
Sodium: 138 mmol/L (ref 135–145)
Total Bilirubin: 1 mg/dL (ref 0.3–1.2)
Total Protein: 8.1 g/dL (ref 6.5–8.1)

## 2019-07-07 LAB — C-REACTIVE PROTEIN: CRP: <0.8 mg/dL (ref <1.0)

## 2019-07-07 LAB — ACETAMINOPHEN LEVEL: Acetaminophen (Tylenol), Serum: <10 ug/mL — ABNORMAL LOW (ref 10–30)

## 2019-07-07 LAB — URINE DRUG SCREEN, QUALITATIVE (ARMC ONLY)
Amphetamines, Ur Screen: NOT DETECTED
Barbiturates, Ur Screen: NOT DETECTED
Benzodiazepine, Ur Scrn: NOT DETECTED
Cannabinoid 50 Ng, Ur ~~LOC~~: NOT DETECTED
Cocaine Metabolite,Ur ~~LOC~~: NOT DETECTED
MDMA (Ecstasy)Ur Screen: NOT DETECTED
Methadone Scn, Ur: NOT DETECTED
Opiate, Ur Screen: NOT DETECTED
Phencyclidine (PCP) Ur S: NOT DETECTED
Tricyclic, Ur Screen: NOT DETECTED

## 2019-07-07 LAB — URINALYSIS, COMPLETE (UACMP) WITH MICROSCOPIC
Bilirubin Urine: NEGATIVE
Glucose, UA: NEGATIVE mg/dL
Ketones, ur: NEGATIVE mg/dL
Nitrite: POSITIVE — AB
Protein, ur: NEGATIVE mg/dL
Specific Gravity, Urine: 1.023 (ref 1.005–1.030)
pH: 5 (ref 5.0–8.0)

## 2019-07-07 LAB — SALICYLATE LEVEL: Salicylate Lvl: <7 mg/dL (ref 2.8–30.0)

## 2019-07-07 LAB — LACTIC ACID, PLASMA: Lactic Acid, Venous: 1 mmol/L (ref 0.5–1.9)

## 2019-07-07 LAB — CK: Total CK: 55 U/L (ref 38–234)

## 2019-07-07 LAB — POCT PREGNANCY, URINE: Preg Test, Ur: NEGATIVE

## 2019-07-07 LAB — SEDIMENTATION RATE: Sed Rate: 18 mm/hr (ref 0–20)

## 2019-07-07 LAB — ETHANOL: Alcohol, Ethyl (B): <10 mg/dL (ref <10)

## 2019-07-07 LAB — PREGNANCY, URINE: Preg Test, Ur: NEGATIVE

## 2019-07-07 MED ORDER — LISINOPRIL 10 MG PO TABS
10.0000 mg | ORAL_TABLET | Freq: Every day | ORAL | Status: DC
  Filled 2019-07-07: qty 1

## 2019-07-07 MED ORDER — SODIUM CHLORIDE 0.9 % IV BOLUS
1000.0000 mL | Freq: Once | INTRAVENOUS | Status: AC
  Administered 2019-07-07: 18:00:00 1000 mL via INTRAVENOUS

## 2019-07-07 MED ORDER — SODIUM CHLORIDE 0.9 % IV SOLN
1.0000 g | INTRAVENOUS | Status: DC
  Administered 2019-07-07 – 2019-07-10 (×4): 1 g via INTRAVENOUS
  Filled 2019-07-07 (×5): qty 10
  Filled 2019-07-07 (×2): qty 1

## 2019-07-07 MED ORDER — LORAZEPAM 2 MG/ML IJ SOLN
2.0000 mg | Freq: Once | INTRAMUSCULAR | Status: AC
  Administered 2019-07-07: 16:00:00 2 mg via INTRAVENOUS
  Filled 2019-07-07: qty 1

## 2019-07-07 MED ORDER — ACETAMINOPHEN 325 MG PO TABS
650.0000 mg | ORAL_TABLET | Freq: Four times a day (QID) | ORAL | Status: DC | PRN
  Administered 2019-07-12: 650 mg via ORAL
  Filled 2019-07-07: qty 2

## 2019-07-07 MED ORDER — SODIUM CHLORIDE 0.9 % IV BOLUS
1000.0000 mL | Freq: Once | INTRAVENOUS | Status: AC
  Administered 2019-07-07: 16:00:00 1000 mL via INTRAVENOUS

## 2019-07-07 MED ORDER — SODIUM CHLORIDE 0.9 % IV BOLUS
1000.0000 mL | Freq: Once | INTRAVENOUS | Status: AC
  Administered 2019-07-07: 09:00:00 1000 mL via INTRAVENOUS

## 2019-07-07 MED ORDER — LORAZEPAM 2 MG/ML IJ SOLN
1.0000 mg | Freq: Once | INTRAMUSCULAR | Status: AC
  Administered 2019-07-07: 1 mg via INTRAVENOUS
  Filled 2019-07-07: qty 1

## 2019-07-07 MED ORDER — LAMOTRIGINE 25 MG PO TABS
100.0000 mg | ORAL_TABLET | Freq: Every day | ORAL | Status: DC
  Administered 2019-07-11 – 2019-07-12 (×2): 100 mg via ORAL
  Filled 2019-07-07 (×2): qty 4
  Filled 2019-07-07: qty 1

## 2019-07-07 MED ORDER — SODIUM CHLORIDE 0.9 % IV SOLN
1.0000 g | Freq: Once | INTRAVENOUS | Status: AC
  Administered 2019-07-07: 10:00:00 1 g via INTRAVENOUS
  Filled 2019-07-07: qty 10

## 2019-07-07 MED ORDER — RISPERIDONE 1 MG PO TABS: 2.0000 mg | ORAL_TABLET | Freq: Every day | ORAL | Status: DC

## 2019-07-07 MED ORDER — GABAPENTIN 600 MG PO TABS: 600.0000 mg | ORAL_TABLET | Freq: Three times a day (TID) | ORAL | Status: DC

## 2019-07-07 MED ORDER — SODIUM CHLORIDE 0.9 % IV BOLUS
1000.0000 mL | Freq: Once | INTRAVENOUS | Status: AC
  Administered 2019-07-07: 11:00:00 1000 mL via INTRAVENOUS

## 2019-07-07 MED ORDER — FOLIC ACID 5 MG/ML IJ SOLN
1.0000 mg | Freq: Every day | INTRAMUSCULAR | Status: DC
  Administered 2019-07-07 – 2019-07-11 (×5): 1 mg via INTRAVENOUS
  Filled 2019-07-07 (×6): qty 0.2

## 2019-07-07 MED ORDER — THIAMINE HCL 100 MG/ML IJ SOLN
100.0000 mg | Freq: Once | INTRAMUSCULAR | Status: AC
  Administered 2019-07-07: 100 mg via INTRAVENOUS
  Filled 2019-07-07: qty 2

## 2019-07-07 MED ORDER — DULOXETINE HCL 60 MG PO CPEP
60.0000 mg | ORAL_CAPSULE | Freq: Every day | ORAL | Status: DC
  Administered 2019-07-11 – 2019-07-12 (×2): 60 mg via ORAL
  Filled 2019-07-07 (×5): qty 1

## 2019-07-07 MED ORDER — PYRIDOXINE HCL 100 MG/ML IJ SOLN
100.0000 mg | Freq: Once | INTRAMUSCULAR | Status: AC
  Administered 2019-07-07: 100 mg via INTRAVENOUS
  Filled 2019-07-07: qty 1

## 2019-07-07 NOTE — Progress Notes (Signed)
I attempted to assess patient, but she is still not verbally responsive. She is lying in the bed with her eyes open. Chart review shows an admission at [facility] from 01/14/19 thru 04/01/19 for sensory neuronopathy. There is concern for catatonia. Discussed with Dr. Einar Grad and Dr. Joni Fears. Recommend to start ativan. Per Up-to-date IV ativan 1-2 mg Q5-10 minutes or IM, Oral, or Sublingual 2 mg every 3 hours as needed.

## 2019-07-07 NOTE — ED Notes (Signed)
MD called to bedside to witness patient in stiff position with legs up in air. Patient holding that position until bed was made flat. Patients legs dropped to bed when bed leg lift level was released. Patient incontinent of bladder  Peri care and change of pas, rectal temp obtained and documented. IV meds given as ordered. NSS infusing second IV line placed, labs drawn and sent. Awaiting. Psych consult completed. Will continue to monitor. Safety maintained.

## 2019-07-07 NOTE — ED Notes (Signed)
Patient resting heart rate down to 117, b/p down as well. vss documented.

## 2019-07-07 NOTE — ED Notes (Signed)
Pt significant contacted and updated at this. He states pt not acting right, acting slow and sluggish, like her memory wasn't that great over the last few days. Significant other states took several days to recover from previous episode in 2016/2017 that has similar symptoms.

## 2019-07-07 NOTE — ED Notes (Signed)
Patient with eyes open staring into the ceiling, Patient was accidentally called molly instead of [place], patient responded with " That's not my name", when I apologized for accidentally calling her by the wrong name, I explained to patient that MRI was ordered. I asked patient if she would be able to talk to the MRI tech for screening. Patient responded " Im not here for MRI". I explaned to patient she was having sz like activity and MRI was ordered to R/O sz's. I asked the patient if she would like to Physician called to her bedside to discuss plan of care. Patient stated" I do not want to talk to the doctor, and I do not want an MRI today". Message related to Dr. Lendon Collar. Will continue to monitor. Safety maintained. Call light within reach.
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
## 2019-07-07 NOTE — ED Provider Notes (Signed)
I was asked to evaluate the patient due to agitation/aching, minimal responsiveness.  Patient reportedly was initially seen and had psychiatry evaluated for catatonia.  On my assessment, the patient is tachycardic to the 150s, hypertensive, and in mild distress.  She is satting well on room air.  She does orient her eyes away from the examiner.  I reviewed [facility] from [facility].  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  Will send inflammatory labs, give IV Ativan, and reassess.  Reflexes are normal, calves and muscles do not appear rigid, and she is afebrile, making neuroleptic malignant syndrome or serotonin syndrome less likely.  ----------------------------------------- 8:21 PM on 07/07/2019 -----------------------------------------  CT head negative.  Patient seems to be improving with multiple doses of IV Ativan.  Her heart rate is down in the 100s and blood pressure 150s over 100s.  She has a history of hypertension and tachycardia per review of records.  Given her complex history, I added on a sed rate, CRP, and CK, all of which are completely normal.  This makes autoimmune cerebritis, NMS, seizure, other medical etiology is significantly less likely.  I had ordered an MRI initially, but following Ativan, patient began to wake up enough to refuse when MRI tech came to get her.  She told the patient tech that she did not need an MRI and that she was okay.  However, after immediately entered the room, she again stared straight ahead, became slightly more tachycardic, and refused to respond to me.  I do suspect much of this is catatonic and based on discussion with significant other, this is similar to her previous presentations.  Will continue Ativan.  Given her history of  gastric bypass and alcohol use, will also give a dose of thiamine and folic acid and continue these while awaiting response.  CRITICAL CARE Performed by: Evonnie Pat   Total critical care time: 45 minutes  Critical care time was exclusive of separately billable procedures and treating other patients.  Critical care was necessary to treat or prevent imminent or life-threatening deterioration.  Critical care was time spent personally by me on the following activities: development of treatment plan with patient and/or surrogate as well as nursing, discussions with consultants, evaluation of patient's response to treatment, examination of patient, obtaining history from patient or surrogate, ordering and performing treatments and interventions, ordering and review of laboratory studies, ordering and review of radiographic studies, pulse oximetry and re-evaluation of patient's condition.    Duffy Bruce, MD 07/07/19 2023

## 2019-07-07 NOTE — ED Notes (Signed)
Patient not speaking, unable to give po meds due to patient not responding to being able to swallow. Patient with open eyes staring at ceiling.

## 2019-07-07 NOTE — ED Notes (Addendum)
Pt began visibly and audibly crying at this time with eyes open. Pt blinking eyes occasionally. Still not responding to any commands or answering any questions. MD notified and placing order for ativan at this time
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
## 2019-07-07 NOTE — ED Notes (Signed)
Pt is calm at this time, eye are open blinking around 8 times per min.

## 2019-07-08 ENCOUNTER — Emergency Department: Payer: Medicaid Other

## 2019-07-08 DIAGNOSIS — F314 Bipolar disorder, current episode depressed, severe, without psychotic features: Secondary | ICD-10-CM

## 2019-07-08 DIAGNOSIS — A419 Sepsis, unspecified organism: Secondary | ICD-10-CM | POA: Diagnosis present

## 2019-07-08 DIAGNOSIS — F419 Anxiety disorder, unspecified: Secondary | ICD-10-CM | POA: Diagnosis present

## 2019-07-08 DIAGNOSIS — M792 Neuralgia and neuritis, unspecified: Secondary | ICD-10-CM

## 2019-07-08 DIAGNOSIS — G629 Polyneuropathy, unspecified: Secondary | ICD-10-CM | POA: Diagnosis present

## 2019-07-08 DIAGNOSIS — I10 Essential (primary) hypertension: Secondary | ICD-10-CM

## 2019-07-08 DIAGNOSIS — F319 Bipolar disorder, unspecified: Secondary | ICD-10-CM | POA: Diagnosis not present

## 2019-07-08 DIAGNOSIS — Z6839 Body mass index (BMI) 39.0-39.9, adult: Secondary | ICD-10-CM | POA: Diagnosis not present

## 2019-07-08 DIAGNOSIS — Z9884 Bariatric surgery status: Secondary | ICD-10-CM | POA: Diagnosis not present

## 2019-07-08 DIAGNOSIS — R402352 Coma scale, best motor response, localizes pain, at arrival to emergency department: Secondary | ICD-10-CM | POA: Diagnosis present

## 2019-07-08 DIAGNOSIS — R402212 Coma scale, best verbal response, none, at arrival to emergency department: Secondary | ICD-10-CM | POA: Diagnosis present

## 2019-07-08 DIAGNOSIS — F061 Catatonic disorder due to known physiological condition: Secondary | ICD-10-CM | POA: Diagnosis present

## 2019-07-08 DIAGNOSIS — R6520 Severe sepsis without septic shock: Secondary | ICD-10-CM | POA: Diagnosis present

## 2019-07-08 DIAGNOSIS — N179 Acute kidney failure, unspecified: Secondary | ICD-10-CM | POA: Diagnosis present

## 2019-07-08 DIAGNOSIS — F3130 Bipolar disorder, current episode depressed, mild or moderate severity, unspecified: Secondary | ICD-10-CM | POA: Diagnosis not present

## 2019-07-08 DIAGNOSIS — N390 Urinary tract infection, site not specified: Secondary | ICD-10-CM | POA: Diagnosis present

## 2019-07-08 DIAGNOSIS — Z79899 Other long term (current) drug therapy: Secondary | ICD-10-CM | POA: Diagnosis not present

## 2019-07-08 DIAGNOSIS — Z20828 Contact with and (suspected) exposure to other viral communicable diseases: Secondary | ICD-10-CM | POA: Diagnosis present

## 2019-07-08 DIAGNOSIS — R402112 Coma scale, eyes open, never, at arrival to emergency department: Secondary | ICD-10-CM | POA: Diagnosis present

## 2019-07-08 DIAGNOSIS — E860 Dehydration: Secondary | ICD-10-CM | POA: Diagnosis present

## 2019-07-08 DIAGNOSIS — E669 Obesity, unspecified: Secondary | ICD-10-CM | POA: Diagnosis present

## 2019-07-08 DIAGNOSIS — F05 Delirium due to known physiological condition: Secondary | ICD-10-CM | POA: Diagnosis present

## 2019-07-08 DIAGNOSIS — A4151 Sepsis due to Escherichia coli [E. coli]: Secondary | ICD-10-CM | POA: Diagnosis not present

## 2019-07-08 LAB — LACTIC ACID, PLASMA: Lactic Acid, Venous: 0.9 mmol/L (ref 0.5–1.9)

## 2019-07-08 LAB — SARS CORONAVIRUS 2 BY RT PCR (HOSPITAL ORDER, PERFORMED IN ~~LOC~~ HOSPITAL LAB): SARS Coronavirus 2: NEGATIVE

## 2019-07-08 MED ORDER — ONDANSETRON HCL 4 MG/2ML IJ SOLN: 4.0000 mg | Freq: Four times a day (QID) | INTRAMUSCULAR | Status: DC | PRN

## 2019-07-08 MED ORDER — GABAPENTIN 300 MG PO CAPS: 600.0000 mg | ORAL_CAPSULE | Freq: Three times a day (TID) | ORAL | Status: DC

## 2019-07-08 MED ORDER — LORAZEPAM 2 MG/ML IJ SOLN
1.0000 mg | INTRAMUSCULAR | Status: DC
  Administered 2019-07-08 (×5): 1 mg via INTRAMUSCULAR
  Filled 2019-07-08 (×6): qty 1

## 2019-07-08 MED ORDER — LORAZEPAM 2 MG/ML IJ SOLN
1.0000 mg | Freq: Once | INTRAMUSCULAR | Status: DC
  Filled 2019-07-08: qty 1

## 2019-07-08 MED ORDER — RISPERIDONE 1 MG PO TABS
2.0000 mg | ORAL_TABLET | Freq: Every day | ORAL | Status: DC
  Filled 2019-07-08 (×2): qty 2

## 2019-07-08 MED ORDER — SODIUM CHLORIDE 0.9 % IV BOLUS
1000.0000 mL | Freq: Once | INTRAVENOUS | Status: AC
  Administered 2019-07-08: 19:00:00 1000 mL via INTRAVENOUS

## 2019-07-08 MED ORDER — ONDANSETRON HCL 4 MG PO TABS: 4.0000 mg | ORAL_TABLET | Freq: Four times a day (QID) | ORAL | Status: DC | PRN

## 2019-07-08 MED ORDER — OLANZAPINE 5 MG PO TABS: 5.0000 mg | ORAL_TABLET | Freq: Two times a day (BID) | ORAL | Status: DC

## 2019-07-08 MED ORDER — LABETALOL HCL 5 MG/ML IV SOLN
10.0000 mg | INTRAVENOUS | Status: DC | PRN
  Administered 2019-07-08 – 2019-07-09 (×2): 10 mg via INTRAVENOUS
  Filled 2019-07-08 (×2): qty 4

## 2019-07-08 MED ORDER — ENOXAPARIN SODIUM 40 MG/0.4ML ~~LOC~~ SOLN
40.0000 mg | Freq: Two times a day (BID) | SUBCUTANEOUS | Status: DC
  Administered 2019-07-09 – 2019-07-12 (×7): 40 mg via SUBCUTANEOUS
  Filled 2019-07-08 (×6): qty 0.4

## 2019-07-08 MED ORDER — OLANZAPINE 10 MG IM SOLR: 5.0000 mg | Freq: Two times a day (BID) | INTRAMUSCULAR | Status: DC

## 2019-07-08 MED ORDER — LORAZEPAM 1 MG PO TABS
1.0000 mg | ORAL_TABLET | ORAL | Status: DC
  Filled 2019-07-08: qty 1

## 2019-07-08 NOTE — ED Notes (Addendum)
Pt laying in bed, eyes open, crying, sobbing. Attempted to talk to pt. Pt did not respond. Unable  to notify NP by phone but NP and TTS to bedside a few minutes later.

## 2019-07-08 NOTE — ED Notes (Signed)
Went into room to give pt ativan 1 mg im. Pt said she did not want a shot but would take a pill. First words since before 0700. 1 mg po removed from pixis. Pt started that she was wrong, she was not going to take a shot or a pill. Talked to pt and tried to convince her to take one or the other. Pt stopped talking and was staring up at ceiling. Ativan 1 mg im given.

## 2019-07-08 NOTE — Consult Note (Addendum)
[facility] Face-to-Face Psychiatry Consult   Reason for Consult:  Catatonia  Referring Physician:  EDP Patient Identification: Debbie Golden MRN:  [id number] Principal Diagnosis: Bipolar affective disorder, depressed, severe  Diagnosis:  Active Problems:   Catatonia associated with another mental disorder  Total Time spent with patient: 1 hour  Subjective:   Debbie Golden is a 48 y.o. female patient admitted with catatonia.  Nonverbal, minimal movement on assessment, will open her eyes and look at this provider.  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 Ativan provided regularly with some relief, crying at tears.  Later this morning she told the nurse she did not want an injection but when he offered the pill form of Ativan, she refused it.  Continues to lie in bed with minimal movement, typically with her eyes.  She will look at you at times when talking.  Ativan changed to Zyprexa to see if this will improve her symptoms better.  Past Psychiatric History: bipolar disorder  Risk to Self:  impaired Risk to Others:  none Prior Inpatient Therapy:  yes Prior Outpatient Therapy:  yes  Past Medical History:  Past Medical History:  Diagnosis Date  . Bipolar disorder (HCC)   . Depression   . Genital herpes   . Obesity   . Weakness     Past Surgical History:  Procedure Laterality Date  . ANKLE FRACTURE SURGERY Right   . GASTRIC BYPASS N/A 2007  . ORIF ANKLE FRACTURE Left 04/02/2018   Procedure: OPEN REDUCTION INTERNAL FIXATION (ORIF) ANKLE FRACTURE;  Surgeon: Deeann SaintMiller, Howard, MD;  Location: [facility] ORS;  Service: Orthopedics;  Laterality: Left;   Family History:  Family History  Adopted: Yes   Family Psychiatric  History: none Social History:  Social History   Substance and Sexual Activity  Alcohol Use Yes   Comment: 3 drinks per week     Social  History   Substance and Sexual Activity  Drug Use No    Social History   Socioeconomic History  . Marital status: Single    Spouse name: Not on file  . Number of children: 0  . Years of education: 16  . Highest education level: Master's degree (e.g., MA, MS, MEng, MEd, MSW, MBA)  Occupational History  . 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 Financial resource strain: Somewhat hard  . Food insecurity    Worry: Sometimes true    Inability: Never true  . Transportation needs    Medical: No    Non-medical: Yes  Tobacco Use  . Smoking status: Never Smoker  . Smokeless tobacco: Never Used  Substance and Sexual Activity  . Alcohol use: Yes    Comment: 3 drinks per week  . Drug use: No  . Sexual activity: Not on file  Lifestyle  . Physical activity    Days per week: 0 days    Minutes per session: 0 min  . Stress: Very much  Relationships  . Social connections    Talks on phone: More than three times a week    Gets together: More than three times a week    Attends religious service: Never    Active member of club or organization: Yes    Attends meetings of clubs or organizations: Never    Relationship status: Living with partner  Other Topics Concern  . Not on  file  Social History Narrative   Lives at home with her fiance, Nicole Kindred.   Right-handed.   One cup caffeine per day.   Additional Social History:    Allergies:  No Known Allergies  Labs:  Results for orders placed or performed during the hospital encounter of 07/07/19 (from the past 48 hour(s))  Acetaminophen level     Status: Abnormal   Collection Time: 07/07/19  8:40 AM  Result Value Ref Range   Acetaminophen (Tylenol), Serum <10 (L) 10 - 30 ug/mL    Comment: (NOTE) Therapeutic concentrations vary significantly. A range of 10-30 ug/mL  may be an effective concentration for many patients. However, some  are best treated at concentrations outside of this range. Acetaminophen concentrations >150 ug/mL  at 4 hours after ingestion  and >50 ug/mL at 12 hours after ingestion are often associated with  toxic reactions. Performed at [facility], [address] [place], [place] [address]   Comprehensive metabolic panel     Status: Abnormal   Collection Time: 07/07/19  8:40 AM  Result Value Ref Range   Sodium 138 135 - 145 mmol/L   Potassium 4.4 3.5 - 5.1 mmol/L   Chloride 103 98 - 111 mmol/L   CO2 24 22 - 32 mmol/L   Glucose, Bld 133 (H) 70 - 99 mg/dL   BUN 18 6 - 20 mg/dL   Creatinine, Ser 1.32 (H) 0.44 - 1.00 mg/dL   Calcium 9.7 8.9 - 10.3 mg/dL   Total Protein 8.1 6.5 - 8.1 g/dL   Albumin 4.0 3.5 - 5.0 g/dL   AST 30 15 - 41 U/L   ALT 20 0 - 44 U/L   Alkaline Phosphatase 159 (H) 38 - 126 U/L   Total Bilirubin 1.0 0.3 - 1.2 mg/dL   GFR calc non Af Amer 48 (L) >60 mL/min   GFR calc Af Amer 55 (L) >60 mL/min   Anion gap 11 5 - 15    Comment: Performed at [facility], [address] [place], [place] [address]  Ethanol     Status: None   Collection Time: 07/07/19  8:40 AM  Result Value Ref Range   Alcohol, Ethyl (B) <10 <10 mg/dL    Comment: (NOTE) Lowest detectable limit for serum alcohol is 10 mg/dL. For medical purposes only. Performed at [facility], [address] [place], [place] [address]   Salicylate level     Status: None   Collection Time: 07/07/19  8:40 AM  Result Value Ref Range   Salicylate Lvl <1.6 2.8 - 30.0 mg/dL    Comment: Performed at [facility], [address] [place], [place] [address]  CBC with Differential     Status: Abnormal   Collection Time: 07/07/19  8:40 AM  Result Value Ref Range   WBC 4.6 4.0 - 10.5 K/uL   RBC 3.77 (L) 3.87 - 5.11 MIL/uL   Hemoglobin 11.6 (L) 12.0 - 15.0 g/dL   HCT 36.9 36.0 - 46.0 %   MCV 97.9 80.0 - 100.0 fL   MCH 30.8 26.0 - 34.0 pg   MCHC 31.4 30.0 - 36.0 g/dL   RDW 16.9 (H) 11.5 - 15.5 %   Platelets 169 150 - 400 K/uL   nRBC 0.0 0.0 - 0.2 %   Neutrophils Relative % 61 %    Neutro Abs 2.8 1.7 - 7.7 K/uL   Lymphocytes Relative 28 %   Lymphs Abs 1.3 0.7 - 4.0 K/uL   Monocytes  Relative 8 %   Monocytes Absolute 0.4 0.1 - 1.0 K/uL   Eosinophils Relative 3 %   Eosinophils Absolute 0.1 0.0 - 0.5 K/uL   Basophils Relative 0 %   Basophils Absolute 0.0 0.0 - 0.1 K/uL   Immature Granulocytes 0 %   Abs Immature Granulocytes 0.02 0.00 - 0.07 K/uL    Comment: Performed at [facility], [address] [place], [place] [address]  Pregnancy, urine     Status: None   Collection Time: 07/07/19  8:40 AM  Result Value Ref Range   Preg Test, Ur NEGATIVE NEGATIVE    Comment: Performed at [facility], [address] [place], [place] [address]  Urinalysis, Complete w Microscopic     Status: Abnormal   Collection Time: 07/07/19  8:40 AM  Result Value Ref Range   Color, Urine AMBER (A) YELLOW    Comment: BIOCHEMICALS MAY BE AFFECTED BY COLOR   APPearance CLOUDY (A) CLEAR   Specific Gravity, Urine 1.023 1.005 - 1.030   pH 5.0 5.0 - 8.0   Glucose, UA NEGATIVE NEGATIVE mg/dL   Hgb urine dipstick SMALL (A) NEGATIVE   Bilirubin Urine NEGATIVE NEGATIVE   Ketones, ur NEGATIVE NEGATIVE mg/dL   Protein, ur NEGATIVE NEGATIVE mg/dL   Nitrite POSITIVE (A) NEGATIVE   Leukocytes,Ua SMALL (A) NEGATIVE   RBC / HPF 0-5 0 - 5 RBC/hpf   WBC, UA 21-50 0 - 5 WBC/hpf   Bacteria, UA MANY (A) NONE SEEN   Squamous Epithelial / LPF 0-5 0 - 5   WBC Clumps PRESENT    Mucus PRESENT    Hyaline Casts, UA PRESENT     Comment: Performed at [facility], [address], [place], [place] [address]  Urine Drug Screen, Qualitative     Status: None   Collection Time: 07/07/19  8:40 AM  Result Value Ref Range   Tricyclic, Ur Screen NONE DETECTED NONE DETECTED   Amphetamines, Ur Screen NONE DETECTED NONE DETECTED   MDMA (Ecstasy)Ur Screen NONE DETECTED NONE DETECTED   Cocaine Metabolite,Ur [place] NONE DETECTED NONE DETECTED   Opiate, Ur Screen NONE DETECTED NONE DETECTED    Phencyclidine (PCP) Ur S NONE DETECTED NONE DETECTED   Cannabinoid 50 Ng, Ur [place] NONE DETECTED NONE DETECTED   Barbiturates, Ur Screen NONE DETECTED NONE DETECTED   Benzodiazepine, Ur Scrn NONE DETECTED NONE DETECTED   Methadone Scn, Ur NONE DETECTED NONE DETECTED    Comment: (NOTE) Tricyclics + metabolites, urine    Cutoff 1000 ng/mL Amphetamines + metabolites, urine  Cutoff 1000 ng/mL MDMA (Ecstasy), urine              Cutoff 500 ng/mL Cocaine Metabolite, urine          Cutoff 300 ng/mL Opiate + metabolites, urine        Cutoff 300 ng/mL Phencyclidine (PCP), urine         Cutoff 25 ng/mL Cannabinoid, urine                 Cutoff 50 ng/mL Barbiturates + metabolites, urine  Cutoff 200 ng/mL Benzodiazepine, urine              Cutoff 200 ng/mL Methadone, urine                   Cutoff 300 ng/mL The urine drug screen provides only a preliminary, unconfirmed analytical test result and should not be used for non-medical purposes. Clinical consideration and professional judgment should be  applied to any positive drug screen result due to possible interfering substances. A more specific alternate chemical method must be used in order to obtain a confirmed analytical result. Gas chromatography / mass spectrometry (GC/MS) is the preferred confirmat ory method. Performed at [facility], [address] [place], [place] [address]   Urine Culture     Status: Abnormal (Preliminary result)   Collection Time: 07/07/19  8:40 AM   Specimen: Urine, Random  Result Value Ref Range   Specimen Description      URINE, RANDOM Performed at [facility], [address] [place], [place] [address]    Special Requests      NONE Performed at [facility], [address] [place], [place] [address]    Culture >=100,000 COLONIES/mL ESCHERICHIA COLI (A)    Report Status PENDING   CK     Status: None   Collection Time: 07/07/19  8:40 AM  Result Value Ref Range   Total CK 55  38 - 234 U/L    Comment: Performed at [facility], [address] [place], [place] [address]  Pregnancy, urine POC     Status: None   Collection Time: 07/07/19  9:21 AM  Result Value Ref Range   Preg Test, Ur NEGATIVE NEGATIVE    Comment:        THE SENSITIVITY OF THIS METHODOLOGY IS >24 mIU/mL   Sedimentation rate     Status: None   Collection Time: 07/07/19  4:03 PM  Result Value Ref Range   Sed Rate 18 0 - 20 mm/hr    Comment: Performed at [facility], [address] [place], [place] [address]  C-reactive protein     Status: None   Collection Time: 07/07/19  4:03 PM  Result Value Ref Range   CRP <0.8 <1.0 mg/dL    Comment: Performed at [facility], [address] [place], [place] [address]  Lactic acid, plasma     Status: None   Collection Time: 07/07/19  4:03 PM  Result Value Ref Range   Lactic Acid, Venous 1.0 0.5 - 1.9 mmol/L    Comment: Performed at [facility], [address] [place], [place] [address]    Current Facility-Administered Medications  Medication Dose Route Frequency Provider Last Rate Last Dose  . acetaminophen (TYLENOL) tablet 650 mg  650 mg Oral Q6H PRN Shaune PollackIsaacs, Cameron, MD      . cefTRIAXone (ROCEPHIN) 1 g in sodium chloride 0.9 % 100 mL IVPB  1 g Intravenous Q24H Sharman CheekStafford, Phillip, MD   Stopped at 07/08/19 0020  . DULoxetine (CYMBALTA) DR capsule 60 mg  60 mg Oral Daily Shaune PollackIsaacs, Cameron, MD   Stopped at 07/07/19 2337  . folic acid injection 1 mg  1 mg Intravenous Daily Shaune PollackIsaacs, Cameron, MD   1 mg at 07/08/19 1009  . gabapentin (NEURONTIN) tablet 600 mg  600 mg Oral TID Shaune PollackIsaacs, Cameron, MD   Stopped at 07/07/19 2338  . lamoTRIgine (LAMICTAL) tablet 100 mg  100 mg Oral Daily Shaune PollackIsaacs, Cameron, MD   Stopped at 07/07/19 2339  . lisinopril (ZESTRIL) tablet 10 mg  10 mg Oral Daily Shaune PollackIsaacs, Cameron, MD   Stopped at 07/07/19 2339  . LORazepam (ATIVAN) tablet 1 mg  1 mg Oral Q1H Lord, Herminio HeadsJamison Y, NP       Or  . LORazepam  (ATIVAN) injection 1 mg  1 mg Intramuscular Q1H Charm RingsLord, Jamison Y, NP   1 mg at 07/08/19  1720   Current Outpatient Medications  Medication Sig Dispense Refill  . acetaminophen (TYLENOL) 325 MG tablet Take 2 tablets (650 mg total) by mouth every 6 (six) hours as needed for mild pain (or Fever >/= 101).    . Cholecalciferol (VITAMIN D3 PO) Take 4,000 Units by mouth daily.    . DULoxetine (CYMBALTA) 60 MG capsule Take 1 capsule (60 mg total) by mouth daily. 90 capsule 1  . gabapentin (NEURONTIN) 600 MG tablet Take 600 mg by mouth 3 (three) times daily.    . hydrOXYzine (ATARAX/VISTARIL) 25 MG tablet Take 1 tablet (25 mg total) by mouth 3 (three) times daily as needed. 30 tablet 1  . lamoTRIgine (LAMICTAL) 100 MG tablet Take 1 tablet (100 mg total) by mouth daily. 90 tablet 0  . lisinopril (ZESTRIL) 10 MG tablet Take 1 tablet (10 mg total) by mouth daily. 90 tablet 1  . pyridOXINE (VITAMIN B-6) 100 MG tablet Take 1 tablet (100 mg total) by mouth daily. 30 tablet 1  . risperiDONE (RISPERDAL) 2 MG tablet Take 1 tablet (2 mg total) by mouth at bedtime. 30 tablet 1  . vitamin B-12 (CYANOCOBALAMIN) 1000 MCG tablet Take 1,000 mcg by mouth daily.      Musculoskeletal: Strength & Muscle Tone: decreased Gait & Station: did not walk Patient leans: N/A  Psychiatric Specialty Exam: Physical Exam  Nursing note and vitals reviewed. Constitutional: She appears well-developed and well-nourished.  HENT:  Head: Normocephalic.  Respiratory: Effort normal.  Neurological: She is alert.  Psychiatric: Her affect is blunt. She is slowed. Cognition and memory are impaired. She expresses inappropriate judgment. She is noncommunicative.    Review of Systems  All other systems reviewed and are negative.   Blood pressure (!) 162/105, pulse (!) 106, temperature 98.5 F (36.9 C), temperature source Rectal, resp. rate 19, height 5\' 8"  (1.727 m), weight 120.7 kg, SpO2 99 %.Body mass index is 40.45 kg/m.  General  Appearance: Casual  Eye Contact:  Fair  Speech:  Negative  Volume:  No speech  Mood:  unable to assess  Affect:  Tearful at times  Thought Process:  NA  Orientation:  NA  Thought Content:  NA  Suicidal Thoughts:  unable to assess  Homicidal Thoughts:  unable to assess  Memory:  unable to assess  Judgement:  Impaired  Insight:  unable to assess  Psychomotor Activity:  Decreased  Concentration: Unable to assess  Recall:  unable to assess  Fund of Knowledge:  unable to assess  Language:  Negative  Akathisia:  Negative  Handed:  Unable to assess  AIMS (if indicated):     Assets:  Housing Intimacy Leisure Time Resilience Social Support  ADL's:  Impaired  Cognition:  Impaired,  Severe  Sleep:      Treatment Plan Summary: Daily contact with patient to assess and evaluate symptoms and progress in treatment, Medication management and Plan catatonia related to mental issue:  -Ativan 1 mg IM or oral every hour for 5 doses, then discontinued -Started Zyprexa 5 mg BID oral or IM  Bipolar affective disorder, depressed, severe: -Continued Lamictal 100 mg daily -Continued Risperdal 2 mg at bedtime -Continued Cymbalta 60 mg daily  Anxiety and neuropathic pain: -Decreased gabapentin 600 mg TID to 400 mg TID  HTN: -Continued lisinopril 10 mg daily  Disposition: Recommend psychiatric Inpatient admission when medically cleared. Supportive therapy provided about ongoing stressors.  Nanine MeansJamison Lord, NP 07/08/2019 6:00 PM

## 2019-07-08 NOTE — ED Notes (Signed)
Pt tearful, staring at ceiling. Talked to pt, pt did not acknowledge me.

## 2019-07-08 NOTE — ED Notes (Signed)
Pt tearful at times but still will not respond to verbal or physical stimulation. Pt seems to be aware of surroundings and people in room.

## 2019-07-08 NOTE — Progress Notes (Signed)
PHARMACIST - PHYSICIAN COMMUNICATION  CONCERNING:  Enoxaparin (Lovenox) for DVT Prophylaxis   RECOMMENDATION: Patient was prescribed enoxaprin 40mg  q24 hours for VTE prophylaxis.   Filed Weights   07/07/19 0834  Weight: 266 lb (120.7 kg)    Body mass index is 40.45 kg/m.  Estimated Creatinine Clearance: 71.3 mL/min (A) (by C-G formula based on SCr of 1.32 mg/dL (H)).   Based on [place] patient is candidate for enoxaparin 40mg  every 12 hour dosing due to BMI being >40.  DESCRIPTION: Pharmacy has adjusted enoxaparin dose per [facility] policy.  Patient is now receiving enoxaparin 40mg  every 12 hours.   Pernell Dupre, PharmD, BCPS Clinical Pharmacist 07/08/2019 11:51 PM

## 2019-07-08 NOTE — ED Notes (Signed)
Updated Nicole Kindred (emergency contact) on plan of care and pt's status

## 2019-07-08 NOTE — Progress Notes (Signed)
CODE SEPSIS - PHARMACY COMMUNICATION  **Broad Spectrum Antibiotics should be administered within 1 hour of Sepsis diagnosis**  Time Code Sepsis Called/Page Received: 1910  Antibiotics Ordered: ceftraixone 1 g IV q24h  Time of 1st antibiotic administration: 7/9 2349  Additional action taken by pharmacy: n/a  If necessary, Name of Provider/Nurse Contacted: n/a    Rocky Morel ,PharmD Clinical Pharmacist  07/08/2019  7:29 PM

## 2019-07-08 NOTE — ED Notes (Signed)
Pt lying in bed with eyes open, NAD. She does not speak to me when I ask her a question

## 2019-07-08 NOTE — ED Notes (Signed)
Evert Kohl patient emergency contact called to check on patient. This Probation officer gave update.
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
## 2019-07-08 NOTE — ED Provider Notes (Signed)
Patient febrile, leukocytosis, tachycardic.  Continues to be catatonic, but becomes agitated when examined.  She is noted to be tachycardic, has not received any recent fluids refusing oral meds.  On Rocephin for E. coli UTI with antibiotic resistance profile still pending  As the patient is notably febrile, remains tachycardic with altered mental status and seems prudent at this point to admit her for medical admission for concerns of urinary tract infection, sepsis.  She is also been evaluated by psychiatry who is continue to follow.  Discussed with Benjamine Mola, NP   Delman Kitten, MD 07/08/19 [id number]

## 2019-07-08 NOTE — ED Notes (Signed)
Patient had incontinent episode. Patient incontinent brief changed. Patient was able to follow simple commands.

## 2019-07-08 NOTE — ED Notes (Signed)
Patient unable to assess due to being comatose.

## 2019-07-08 NOTE — ED Notes (Signed)
Patient cried out when this writer went into room patient would not respond with this Probation officer and started starring at ceiling.

## 2019-07-08 NOTE — H&P (Signed)
[facility] [place] NAME: Debbie Golden    MR#:  [id number]  DATE OF BIRTH:  1971/08/26  DATE OF ADMISSION:  07/07/2019  PRIMARY CARE PHYSICIAN: Hubbard Hartshorn, FNP   REQUESTING/REFERRING PHYSICIAN: Jacqualine Code, MD  CHIEF COMPLAINT:   Chief Complaint  Patient presents with  . Psychiatric Evaluation    HISTORY OF PRESENT ILLNESS:  Debbie Golden  is a 48 y.o. female who presents with chief complaint as above.  Patient brought to the ED for catatonia.  Evaluated here by psychiatry with recommendations made, they are following along.  She subsequently became febrile, tachycardic.  On evaluation she had nitrites positive on her UA.  Hospitalist called for admission and treatment of her sepsis and UTI  PAST MEDICAL HISTORY:   Past Medical History:  Diagnosis Date  . Bipolar disorder ([place])   . Depression   . Genital herpes   . Obesity   . Weakness      PAST SURGICAL HISTORY:   Past Surgical History:  Procedure Laterality Date  . ANKLE FRACTURE SURGERY Right   . GASTRIC BYPASS N/A 2007  . ORIF ANKLE FRACTURE Left 04/02/2018   Procedure: OPEN REDUCTION INTERNAL FIXATION (ORIF) ANKLE FRACTURE;  Surgeon: Earnestine Leys, MD;  Location: [facility] ORS;  Service: Orthopedics;  Laterality: Left;     SOCIAL HISTORY:   Social History   Tobacco Use  . Smoking status: Never Smoker  . Smokeless tobacco: Never Used  Substance Use Topics  . Alcohol use: Yes    Comment: 3 drinks per week     FAMILY HISTORY:   Family History  Adopted: Yes    Family history unknown DRUG ALLERGIES:  No Known Allergies  MEDICATIONS AT HOME:   Prior to Admission medications   Medication Sig Start Date End Date Taking? Authorizing Provider  acetaminophen (TYLENOL) 325 MG tablet Take 2 tablets (650 mg total) by mouth every 6 (six) hours as needed for mild pain (or Fever >/= 101). 01/28/18  Yes Rai, Ripudeep K, MD  Cholecalciferol (VITAMIN D3 PO) Take 4,000 Units  by mouth daily.   Yes [provider]  DULoxetine (CYMBALTA) 60 MG capsule Take 1 capsule (60 mg total) by mouth daily. 06/10/19  Yes Hubbard Hartshorn, FNP  gabapentin (NEURONTIN) 600 MG tablet Take 600 mg by mouth 3 (three) times daily.   Yes [provider]  hydrOXYzine (ATARAX/VISTARIL) 25 MG tablet Take 1 tablet (25 mg total) by mouth 3 (three) times daily as needed. 06/10/19  Yes Hubbard Hartshorn, FNP  lamoTRIgine (LAMICTAL) 100 MG tablet Take 1 tablet (100 mg total) by mouth daily. 06/10/19  Yes Hubbard Hartshorn, FNP  lisinopril (ZESTRIL) 10 MG tablet Take 1 tablet (10 mg total) by mouth daily. 06/10/19  Yes Hubbard Hartshorn, FNP  pyridOXINE (VITAMIN B-6) 100 MG tablet Take 1 tablet (100 mg total) by mouth daily. 01/28/18  Yes Rai, Ripudeep K, MD  risperiDONE (RISPERDAL) 2 MG tablet Take 1 tablet (2 mg total) by mouth at bedtime. 06/10/19  Yes Hubbard Hartshorn, FNP  vitamin B-12 (CYANOCOBALAMIN) 1000 MCG tablet Take 1,000 mcg by mouth daily.   Yes [provider]    REVIEW OF SYSTEMS:  Review of Systems  Unable to perform ROS: Acuity of condition     VITAL SIGNS:   Vitals:   07/08/19 2008 07/08/19 2015 07/08/19 2030 07/08/19 2045  BP:   (!) 167/109   Pulse:  (!) 122 (!) 114 Marland Kitchen)  116  Resp:  (!) 28 15 (!) 23  Temp: 99.2 F (37.3 C)     TempSrc: Axillary     SpO2:  98% 100% 96%  Weight:      Height:       Wt Readings from Last 3 Encounters:  07/07/19 120.7 kg  06/10/19 112.3 kg  09/20/18 105.2 kg    PHYSICAL EXAMINATION:  Physical Exam  Vitals reviewed. Constitutional: She appears well-developed and well-nourished. No distress.  HENT:  Head: Normocephalic and atraumatic.  Mouth/Throat: Oropharynx is clear and moist.  Eyes: Pupils are equal, round, and reactive to light. Conjunctivae and EOM are normal. No scleral icterus.  Neck: Normal range of motion. Neck supple. No JVD present. No thyromegaly present.  Cardiovascular: Normal rate, regular rhythm and  intact distal pulses. Exam reveals no gallop and no friction rub.  No murmur heard. Respiratory: Effort normal and breath sounds normal. No respiratory distress. She has no wheezes. She has no rales.  GI: Soft. Bowel sounds are normal. She exhibits no distension. There is no abdominal tenderness.  Musculoskeletal: Normal range of motion.        General: No edema.     Comments: No arthritis, no gout  Lymphadenopathy:    She has no cervical adenopathy.  Neurological:  Unable to assess due to patient condition  Skin: Skin is warm and dry. No rash noted. No erythema.  Psychiatric:  Unable to assess due to patient condition    LABORATORY PANEL:   CBC Recent Labs  Lab 07/07/19 0840  WBC 4.6  HGB 11.6*  HCT 36.9  PLT 169   ------------------------------------------------------------------------------------------------------------------  Chemistries  Recent Labs  Lab 07/07/19 0840  NA 138  K 4.4  CL 103  CO2 24  GLUCOSE 133*  BUN 18  CREATININE 1.32*  CALCIUM 9.7  AST 30  ALT 20  ALKPHOS 159*  BILITOT 1.0   ------------------------------------------------------------------------------------------------------------------  Cardiac Enzymes No results for input(s): TROPONINI in the last 168 hours. ------------------------------------------------------------------------------------------------------------------  RADIOLOGY:  Ct Head Wo Contrast  Result Date: 07/07/2019 CLINICAL DATA:  Altered level of consciousness EXAM: CT HEAD WITHOUT CONTRAST TECHNIQUE: Contiguous axial images were obtained from the base of the skull through the vertex without intravenous contrast. COMPARISON:  01/19/2018 FINDINGS: Brain: No evidence of acute infarction, hemorrhage, hydrocephalus, extra-axial collection or mass lesion/mass effect. Vascular: No hyperdense vessel or unexpected calcification. Skull: No osseous abnormality. Sinuses/Orbits: Visualized paranasal sinuses are clear. Visualized  mastoid sinuses are clear. Visualized orbits demonstrate no focal abnormality. Other: None IMPRESSION: No acute intracranial pathology. Electronically Signed   By: Elige KoHetal  Patel   On: 07/07/2019 16:27   Dg Chest Port 1 View  Result Date: 07/08/2019 CLINICAL DATA:  Leukocytosis.  Fever. EXAM: PORTABLE CHEST 1 VIEW COMPARISON:  None. FINDINGS: The heart size and mediastinal contours are within normal limits. Both lungs are clear. The visualized skeletal structures are unremarkable. IMPRESSION: No active disease. Electronically Signed   By: Gerome Samavid  Williams III M.D   On: 07/08/2019 19:46    EKG:   Orders placed or performed during the hospital encounter of 07/07/19  . EKG 12-Lead  . EKG 12-Lead    IMPRESSION AND PLAN:  Principal Problem:   Severe sepsis (HCC) -lactic acid within normal limits, blood pressure stable, IV antibiotics given, cultures sent from the ED, sepsis due to UTI Active Problems:   Catatonia associated with another mental disorder -psychiatry following, defer to their recommendations for treatment of this problem   UTI (urinary tract infection) -  IV antibiotics and culture as above   AKI (acute kidney injury) (HCC) -IV fluids tonight, avoid nephrotoxins and monitor  Chart review performed and case discussed with ED provider. Labs, imaging and/or ECG reviewed by provider and discussed with patient/family. Management plans discussed with the patient and/or family.  COVID-19 status: Tested negative     DVT PROPHYLAXIS: SubQ lovenox   GI PROPHYLAXIS:  None  ADMISSION STATUS: Inpatient     CODE STATUS: Full Code Status History    Date Active Date Inactive Code Status Order ID Comments User Context   04/03/2018 0052 04/05/2018 2103 Full Code [id number]  Deeann SaintMiller, Howard, MD Inpatient   01/20/2018 0754 02/01/2018 1803 Full Code [id number]  Russella DarEllis, Allison L, NP ED   06/22/2015 0202 06/25/2015 2007 Full Code [id number]  Clapacs, Jackquline DenmarkJohn T, MD Inpatient   Advance Care Planning Activity       TOTAL TIME TAKING CARE OF THIS PATIENT: 45 minutes.   This patient was evaluated in the context of the global COVID-19 pandemic, which necessitated consideration that the patient might be at risk for infection with the SARS-CoV-2 virus that causes COVID-19. Institutional protocols and algorithms that pertain to the evaluation of patients at risk for COVID-19 are in a state of rapid change based on information released by regulatory bodies including the CDC and federal and state organizations. These policies and algorithms were followed to the best of this provider's knowledge to date during the patient's care at this facility.  Barney DrainDavid F Dinna Severs 07/08/2019, 9:08 PM  [facility]  Office  [phone]  CC: Primary care physician; Doren CustardBoyce, Emily E, FNP  Note:  This document was prepared using Dragon voice recognition software and may include unintentional dictation errors.

## 2019-07-08 NOTE — ED Notes (Signed)
Pt lying in bed at this time, is cooperative with blood draw.

## 2019-07-08 NOTE — ED Notes (Signed)
VOL  

## 2019-07-08 NOTE — ED Notes (Signed)
attempted to talk to pt. Pt laying in bed with eyes shut and did not respond to verbal stimulation. Pt seems to be awake and in no visible distress. Pt offered food and drink, no response from pt.

## 2019-07-08 NOTE — ED Notes (Signed)
This tech and Bill, RN checked pts brief and brief was completely soiled with urine. Pt felt warm, so a rectal temperature was taken when linen and brief were changed.

## 2019-07-08 NOTE — ED Notes (Signed)
Pt warm to touch. Rectal temp 100.0, hr 118. md notified - see orders

## 2019-07-09 ENCOUNTER — Other Ambulatory Visit: Payer: Self-pay

## 2019-07-09 DIAGNOSIS — A419 Sepsis, unspecified organism: Secondary | ICD-10-CM

## 2019-07-09 DIAGNOSIS — R6520 Severe sepsis without septic shock: Secondary | ICD-10-CM

## 2019-07-09 LAB — CBC
HCT: 35 % — ABNORMAL LOW (ref 36.0–46.0)
Hemoglobin: 10.9 g/dL — ABNORMAL LOW (ref 12.0–15.0)
MCH: 31.1 pg (ref 26.0–34.0)
MCHC: 31.1 g/dL (ref 30.0–36.0)
MCV: 99.7 fL (ref 80.0–100.0)
Platelets: 184 10*3/uL (ref 150–400)
RBC: 3.51 MIL/uL — ABNORMAL LOW (ref 3.87–5.11)
RDW: 16.8 % — ABNORMAL HIGH (ref 11.5–15.5)
WBC: 4.9 10*3/uL (ref 4.0–10.5)
nRBC: 0 % (ref 0.0–0.2)

## 2019-07-09 LAB — BASIC METABOLIC PANEL
Anion gap: 11 (ref 5–15)
BUN: 10 mg/dL (ref 6–20)
CO2: 20 mmol/L — ABNORMAL LOW (ref 22–32)
Calcium: 9.6 mg/dL (ref 8.9–10.3)
Chloride: 113 mmol/L — ABNORMAL HIGH (ref 98–111)
Creatinine, Ser: 0.96 mg/dL (ref 0.44–1.00)
GFR calc Af Amer: >60 mL/min (ref >60)
GFR calc non Af Amer: >60 mL/min (ref >60)
Glucose, Bld: 108 mg/dL — ABNORMAL HIGH (ref 70–99)
Potassium: 3.7 mmol/L (ref 3.5–5.1)
Sodium: 144 mmol/L (ref 135–145)

## 2019-07-09 LAB — URINE CULTURE: Culture: >=100000 — AB

## 2019-07-09 MED ORDER — KETOROLAC TROMETHAMINE 30 MG/ML IJ SOLN
30.0000 mg | Freq: Four times a day (QID) | INTRAMUSCULAR | Status: DC | PRN
  Administered 2019-07-09: 20:00:00 30 mg via INTRAVENOUS
  Filled 2019-07-09 (×2): qty 1

## 2019-07-09 MED ORDER — SODIUM CHLORIDE 0.9 % IV SOLN
INTRAVENOUS | Status: DC
  Administered 2019-07-09 – 2019-07-11 (×4): via INTRAVENOUS

## 2019-07-09 MED ORDER — GABAPENTIN 400 MG PO CAPS
400.0000 mg | ORAL_CAPSULE | Freq: Three times a day (TID) | ORAL | Status: DC
  Administered 2019-07-11 – 2019-07-12 (×5): 400 mg via ORAL
  Filled 2019-07-09 (×6): qty 1

## 2019-07-09 MED ORDER — OLANZAPINE 10 MG IM SOLR
5.0000 mg | Freq: Two times a day (BID) | INTRAMUSCULAR | Status: DC
  Administered 2019-07-09 – 2019-07-11 (×4): 5 mg via INTRAMUSCULAR
  Filled 2019-07-09 (×8): qty 10

## 2019-07-09 MED ORDER — OLANZAPINE 5 MG PO TABS
5.0000 mg | ORAL_TABLET | Freq: Two times a day (BID) | ORAL | Status: DC
  Administered 2019-07-11 – 2019-07-12 (×3): 5 mg via ORAL
  Filled 2019-07-09 (×4): qty 1

## 2019-07-09 MED ORDER — OLANZAPINE 10 MG IM SOLR
10.0000 mg | Freq: Two times a day (BID) | INTRAMUSCULAR | Status: DC
  Filled 2019-07-09: qty 10

## 2019-07-09 MED ORDER — OLANZAPINE 5 MG PO TABS: 10.0000 mg | ORAL_TABLET | Freq: Two times a day (BID) | ORAL | Status: DC

## 2019-07-09 NOTE — Progress Notes (Signed)
Pt laying in bed with eyes closed, has been tearful at times, not responding to questions. Unable to give PO meds. MD Anselm Jungling now at nurses station and notified of the above.

## 2019-07-09 NOTE — Progress Notes (Signed)
Pt laying in bed with eyes shut, not responding to questions asked. Pt offered food and drink, no response from pt. Safety sitter at bedside.

## 2019-07-09 NOTE — Progress Notes (Signed)
[place] [facility] [place] NAME: Debbie Golden    MR#:  [id number]  DATE OF BIRTH:  Dec 21, 1971  SUBJECTIVE:  CHIEF COMPLAINT:   Chief Complaint  Patient presents with  . Psychiatric Evaluation   Patient admitted to medical service, have catatonia- managed by psychiatry services. Was catatonic and not talking at all.  Not following command.  REVIEW OF SYSTEMS:  Pt is not talking.  ROS  DRUG ALLERGIES:  No Known Allergies  VITALS:  Blood pressure (!) 167/99, pulse (!) 107, temperature 98.8 F (37.1 C), temperature source Oral, resp. rate 16, height 5\' 8"  (1.727 m), weight 119.1 kg, SpO2 98 %.  PHYSICAL EXAMINATION:   Constitutional: She appears well-developed and well-nourished. No distress.  HENT:  Head: Normocephalic and atraumatic.  Mouth/Throat: Oropharynx is clear and moist.  Eyes: Pupils are equal, round, and reactive to light. Conjunctivae and EOM are normal. No scleral icterus.  Neck: Normal range of motion. Neck supple. No JVD present. No thyromegaly present.  Cardiovascular: Normal rate, regular rhythm and intact distal pulses. Exam reveals no gallop and no friction rub.  No murmur heard. Respiratory: Effort normal and breath sounds normal. No respiratory distress. She has no wheezes. She has no rales.  GI: Soft. Bowel sounds are normal. She exhibits no distension. There is no abdominal tenderness.  Musculoskeletal: Normal range of motion.        General: No edema.     Comments: No arthritis, no gout  Lymphadenopathy:    She has no cervical adenopathy.  Neurological:  Unable to assess due to patient condition  Skin: Skin is warm and dry. No rash noted. No erythema.  Psychiatric:  Unable to assess due to patient condition   Physical Exam LABORATORY PANEL:   CBC Recent Labs  Lab 07/09/19 0450  WBC 4.9  HGB 10.9*  HCT 35.0*  PLT 184    ------------------------------------------------------------------------------------------------------------------  Chemistries  Recent Labs  Lab 07/07/19 0840 07/09/19 0450  NA 138 144  K 4.4 3.7  CL 103 113*  CO2 24 20*  GLUCOSE 133* 108*  BUN 18 10  CREATININE 1.32* 0.96  CALCIUM 9.7 9.6  AST 30  --   ALT 20  --   ALKPHOS 159*  --   BILITOT 1.0  --    ------------------------------------------------------------------------------------------------------------------  Cardiac Enzymes No results for input(s): TROPONINI in the last 168 hours. ------------------------------------------------------------------------------------------------------------------  RADIOLOGY:  Ct Head Wo Contrast  Result Date: 07/07/2019 CLINICAL DATA:  Altered level of consciousness EXAM: CT HEAD WITHOUT CONTRAST TECHNIQUE: Contiguous axial images were obtained from the base of the skull through the vertex without intravenous contrast. COMPARISON:  01/19/2018 FINDINGS: Brain: No evidence of acute infarction, hemorrhage, hydrocephalus, extra-axial collection or mass lesion/mass effect. Vascular: No hyperdense vessel or unexpected calcification. Skull: No osseous abnormality. Sinuses/Orbits: Visualized paranasal sinuses are clear. Visualized mastoid sinuses are clear. Visualized orbits demonstrate no focal abnormality. Other: None IMPRESSION: No acute intracranial pathology. Electronically Signed   By: Kathreen Devoid   On: 07/07/2019 16:27   Dg Chest Port 1 View  Result Date: 07/08/2019 CLINICAL DATA:  Leukocytosis.  Fever. EXAM: PORTABLE CHEST 1 VIEW COMPARISON:  None. FINDINGS: The heart size and mediastinal contours are within normal limits. Both lungs are clear. The visualized skeletal structures are unremarkable. IMPRESSION: No active disease. Electronically Signed   By: Dorise Bullion III M.D   On: 07/08/2019 19:46    ASSESSMENT AND PLAN:   Principal Problem:   Severe sepsis (  HCC) Active  Problems:   GERD (gastroesophageal reflux disease)   Catatonia associated with another mental disorder   UTI (urinary tract infection)   AKI (acute kidney injury) (HCC)   Severe sepsis (HCC) -lactic acid within normal limits, blood pressure stable, IV antibiotics given, cultures sent , sepsis due to UTI    Catatonia associated with another mental disorder -psychiatry following, defer to their recommendations for treatment of this problem   UTI (urinary tract infection) -IV antibiotics and culture as above   AKI (acute kidney injury) (HCC) -IV fluids tonight, avoid nephrotoxins and monitor    All the records are reviewed and case discussed with Care Management/Social Workerr. Management plans discussed with the patient, family and they are in agreement.  CODE STATUS: full.  TOTAL TIME TAKING CARE OF THIS PATIENT: 35 minutes.     POSSIBLE D/C IN 1-2 DAYS, DEPENDING ON CLINICAL CONDITION.   Altamese DillingVaibhavkumar Rosina Cressler M.D on 07/09/2019   Between 7am to 6pm - Pager - [phone]  After 6pm go to [email or web address] - password [email or web address]  [facility]  Office  [phone]  CC: Primary care physician; Doren CustardBoyce, Emily E, FNP  Note: This dictation was prepared with Dragon dictation along with smaller phrase technology. Any transcriptional errors that result from this process are unintentional.

## 2019-07-09 NOTE — Consult Note (Signed)
[facility] Face-to-Face Psychiatry Consult   Reason for Consult:  Catatonia  Referring Physician:  EDP Patient Identification: Debbie PatienceHolly Golden MRN:  [id number] Principal Diagnosis: Bipolar affective disorder, depressed, severe  Diagnosis:  Principal Problem:   Severe sepsis (HCC) Active Problems:   Catatonia associated with another mental disorder   GERD (gastroesophageal reflux disease)   UTI (urinary tract infection)   AKI (acute kidney injury) (HCC)  Total Time spent with patient: 30 minutes  Subjective:   Debbie Golden is a 48 y.o. female patient admitted with catatonia.  She was admitted to the medical floor after developing a fever and being treated for sepsis related to a UTI.  The patient remains in catatonic state, not eating or drinking (IV fluids in place).  On assessment today, she would not open her eyes when I tried to arouse her.  She did clench both fists and appeared to be clenching her teeth.  Her sitter reported she "selectively talks."  Ativan was provided consistently yesterday in the ED with minimal response.  Starting Zyprexa IM BID to assist with her catatonia at this time.  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 Ativan provided regularly with some relief, crying at tears.  Later this morning she told the nurse she did not want an injection but when he offered the pill form of Ativan, she refused it.  Continues to lie in bed with minimal movement, typically with her eyes.  She will look at you at times when talking.  Ativan changed to Zyprexa to see if this will improve her symptoms better.  Past Psychiatric History: bipolar disorder  Risk to Self:  impaired Risk to Others:  none Prior Inpatient Therapy:  yes Prior Outpatient Therapy:  yes  Past Medical History:  Past Medical History:  Diagnosis Date  . Bipolar disorder (HCC)   .  Depression   . Genital herpes   . Obesity   . Weakness     Past Surgical History:  Procedure Laterality Date  . ANKLE FRACTURE SURGERY Right   . GASTRIC BYPASS N/A 2007  . ORIF ANKLE FRACTURE Left 04/02/2018   Procedure: OPEN REDUCTION INTERNAL FIXATION (ORIF) ANKLE FRACTURE;  Surgeon: Deeann SaintMiller, Howard, MD;  Location: [facility] ORS;  Service: Orthopedics;  Laterality: Left;   Family History:  Family History  Adopted: Yes   Family Psychiatric  History: none Social History:  Social History   Substance and Sexual Activity  Alcohol Use Yes   Comment: 3 drinks per week     Social History   Substance and Sexual Activity  Drug Use No    Social History   Socioeconomic History  . Marital status: Single    Spouse name: Not on file  . Number of children: 0  . Years of education: 16  . Highest education level: Master's degree (e.g., MA, MS, MEng, MEd, MSW, MBA)  Occupational History  . 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 Financial resource strain: Somewhat hard  . Food insecurity    Worry: Sometimes true    Inability: Never true  . Transportation needs    Medical: No    Non-medical: Yes  Tobacco Use  . Smoking status: Never Smoker  . Smokeless tobacco: Never Used  Substance and Sexual Activity  . Alcohol use: Yes    Comment: 3 drinks per week  . Drug  use: No  . Sexual activity: Not on file  Lifestyle  . Physical activity    Days per week: 0 days    Minutes per session: 0 min  . Stress: Very much  Relationships  . Social connections    Talks on phone: More than three times a week    Gets together: More than three times a week    Attends religious service: Never    Active member of club or organization: Yes    Attends meetings of clubs or organizations: Never    Relationship status: Living with partner  Other Topics Concern  . Not on file  Social History Narrative   Lives at home with her fiance, Nicole Kindred.   Right-handed.   One cup caffeine per day.    Additional Social History:    Allergies:  No Known Allergies  Labs:  Results for orders placed or performed during the hospital encounter of 07/07/19 (from the past 48 hour(s))  Sedimentation rate     Status: None   Collection Time: 07/07/19  4:03 PM  Result Value Ref Range   Sed Rate 18 0 - 20 mm/hr    Comment: Performed at [facility], [address] [place], [place] [address]  C-reactive protein     Status: None   Collection Time: 07/07/19  4:03 PM  Result Value Ref Range   CRP <0.8 <1.0 mg/dL    Comment: Performed at [facility] [address] [place], [place] [address]  Lactic acid, plasma     Status: None   Collection Time: 07/07/19  4:03 PM  Result Value Ref Range   Lactic Acid, Venous 1.0 0.5 - 1.9 mmol/L    Comment: Performed at [facility], [address] [place], [place] [address]  SARS Coronavirus 2 (CEPHEID- Performed in [place] hospital lab), Hosp Order     Status: None   Collection Time: 07/08/19  7:00 PM   Specimen: Nasopharyngeal Swab  Result Value Ref Range   SARS Coronavirus 2 NEGATIVE NEGATIVE    Comment: (NOTE) If result is NEGATIVE SARS-CoV-2 target nucleic acids are NOT DETECTED. The SARS-CoV-2 RNA is generally detectable in upper and lower  respiratory specimens during the acute phase of infection. The lowest  concentration of SARS-CoV-2 viral copies this assay can detect is 250  copies / mL. A negative result does not preclude SARS-CoV-2 infection  and should not be used as the sole basis for treatment or other  patient management decisions.  A negative result may occur with  improper specimen collection / handling, submission of specimen other  than nasopharyngeal swab, presence of viral mutation(s) within the  areas targeted by this assay, and inadequate number of viral copies  (<250 copies / mL). A negative result must be combined with clinical  observations, patient history, and epidemiological  information. If result is POSITIVE SARS-CoV-2 target nucleic acids are DETECTED. The SARS-CoV-2 RNA is generally detectable in upper and lower  respiratory specimens dur ing the acute phase of infection.  Positive  results are indicative of active infection with SARS-CoV-2.  Clinical  correlation with patient history and other diagnostic information is  necessary to determine patient infection status.  Positive results do  not rule out bacterial infection or co-infection with other viruses. If result is PRESUMPTIVE POSTIVE SARS-CoV-2 nucleic acids MAY BE PRESENT.   A presumptive positive result was obtained on the submitted specimen  and confirmed on repeat testing.  While 2019 novel coronavirus  (  SARS-CoV-2) nucleic acids may be present in the submitted sample  additional confirmatory testing may be necessary for epidemiological  and / or clinical management purposes  to differentiate between  SARS-CoV-2 and other Sarbecovirus currently known to infect humans.  If clinically indicated additional testing with an alternate test  methodology [phone]([id number]) is advised. The SARS-CoV-2 RNA is generally  detectable in upper and lower respiratory sp ecimens during the acute  phase of infection. The expected result is Negative. Fact Sheet for Patients:  BoilerBrush.com.cyhttps://www.fda.gov/media/136312/download Fact Sheet for Healthcare Providers: https://pope.com/https://www.fda.gov/media/136313/download This test is not yet approved or cleared by the [place] FDA and has been authorized for detection and/or diagnosis of SARS-CoV-2 by FDA under an Emergency Use Authorization (EUA).  This EUA will remain in effect (meaning this test can be used) for the duration of the COVID-19 declaration under Section 564(b)(1) of the Act, 21 U.S.C. section 360bbb-3(b)(1), unless the authorization is terminated or revoked sooner. Performed at [facility], [address] [place], [place] [address]   Lactic acid, plasma      Status: None   Collection Time: 07/08/19  7:12 PM  Result Value Ref Range   Lactic Acid, Venous 0.9 0.5 - 1.9 mmol/L    Comment: Performed at [facility], [address] [place], [place] [address]  Blood Culture (routine x 2)     Status: None (Preliminary result)   Collection Time: 07/08/19  7:12 PM   Specimen: BLOOD  Result Value Ref Range   Specimen Description BLOOD RIGHT ANTECUBITAL    Special Requests      BOTTLES DRAWN AEROBIC AND ANAEROBIC Blood Culture adequate volume   Culture      NO GROWTH < 12 HOURS Performed at [facility], [address] [place], [place] [address]    Report Status PENDING   Blood Culture (routine x 2)     Status: None (Preliminary result)   Collection Time: 07/08/19  7:12 PM   Specimen: BLOOD  Result Value Ref Range   Specimen Description BLOOD LEFT ANTECUBITAL    Special Requests      BOTTLES DRAWN AEROBIC AND ANAEROBIC Blood Culture results may not be optimal due to an excessive volume of blood received in culture bottles   Culture      NO GROWTH < 12 HOURS Performed at [facility], [address] [place], [place] [address]    Report Status PENDING   Basic metabolic panel     Status: Abnormal   Collection Time: 07/09/19  4:50 AM  Result Value Ref Range   Sodium 144 135 - 145 mmol/L   Potassium 3.7 3.5 - 5.1 mmol/L   Chloride 113 (H) 98 - 111 mmol/L   CO2 20 (L) 22 - 32 mmol/L   Glucose, Bld 108 (H) 70 - 99 mg/dL   BUN 10 6 - 20 mg/dL   Creatinine, Ser 2.950.96 0.44 - 1.00 mg/dL   Calcium 9.6 8.9 - 28.410.3 mg/dL   GFR calc non Af Amer >60 >60 mL/min   GFR calc Af Amer >60 >60 mL/min   Anion gap 11 5 - 15    Comment: Performed at [facility], [address] [place], [place] [address]  CBC     Status: Abnormal   Collection Time: 07/09/19  4:50 AM  Result Value Ref Range   WBC 4.9 4.0 - 10.5 K/uL   RBC 3.51 (L) 3.87 - 5.11 MIL/uL   Hemoglobin 10.9 (L) 12.0 - 15.0 g/dL  HCT 35.0 (L) 36.0 - 46.0 %    MCV 99.7 80.0 - 100.0 fL   MCH 31.1 26.0 - 34.0 pg   MCHC 31.1 30.0 - 36.0 g/dL   RDW 16.1 (H) 09.6 - 04.5 %   Platelets 184 150 - 400 K/uL   nRBC 0.0 0.0 - 0.2 %    Comment: Performed at [facility], [address] [place], [place] [address]    Current Facility-Administered Medications  Medication Dose Route Frequency Provider Last Rate Last Dose  . 0.9 %  sodium chloride infusion   Intravenous Continuous Altamese Dilling, MD 75 mL/hr at 07/09/19 1034    . acetaminophen (TYLENOL) tablet 650 mg  650 mg Oral Q6H PRN Oralia Manis, MD      . cefTRIAXone (ROCEPHIN) 1 g in sodium chloride 0.9 % 100 mL IVPB  1 g Intravenous Q24H Oralia Manis, MD   Stopped at 07/09/19 0145  . DULoxetine (CYMBALTA) DR capsule 60 mg  60 mg Oral Daily Oralia Manis, MD   Stopped at 07/07/19 2337  . enoxaparin (LOVENOX) injection 40 mg  40 mg Subcutaneous Q12H Oralia Manis, MD   40 mg at 07/09/19 0049  . folic acid injection 1 mg  1 mg Intravenous Daily Oralia Manis, MD   1 mg at 07/09/19 1034  . gabapentin (NEURONTIN) capsule 400 mg  400 mg Oral TID Altamese Dilling, MD      . labetalol (NORMODYNE) injection 10 mg  10 mg Intravenous Q2H PRN Oralia Manis, MD   10 mg at 07/08/19 2237  . lamoTRIgine (LAMICTAL) tablet 100 mg  100 mg Oral Daily Oralia Manis, MD   Stopped at 07/07/19 2339  . lisinopril (ZESTRIL) tablet 10 mg  10 mg Oral Daily Oralia Manis, MD   Stopped at 07/07/19 2339  . LORazepam (ATIVAN) injection 1 mg  1 mg Intravenous Once Oralia Manis, MD      . OLANZapine [facility]) tablet 5 mg  5 mg Oral BID Oralia Manis, MD      . ondansetron [facility]) tablet 4 mg  4 mg Oral Q6H PRN Oralia Manis, MD       Or  . ondansetron [facility]) injection 4 mg  4 mg Intravenous Q6H PRN Oralia Manis, MD      . risperiDONE (RISPERDAL) tablet 2 mg  2 mg Oral QHS Oralia Manis, MD        Musculoskeletal: Strength & Muscle Tone: decreased Gait & Station: did not walk Patient leans:  N/A  Psychiatric Specialty Exam: Physical Exam  Nursing note and vitals reviewed. Constitutional: She appears well-developed and well-nourished.  HENT:  Head: Normocephalic.  Respiratory: Effort normal.  Neurological: She is alert.  Psychiatric: Her affect is blunt. She is slowed. Cognition and memory are impaired. She expresses inappropriate judgment. She is noncommunicative.    Review of Systems  All other systems reviewed and are negative.   Blood pressure (!) 167/99, pulse (!) 107, temperature 98.8 F (37.1 C), temperature source Oral, resp. rate 16, height  (1.727 m), weight 119.1 kg, SpO2 98 %.Body mass index is 39.91 kg/m.  General Appearance: Casual  Eye Contact:  Fair  Speech:  Negative  Volume:  No speech  Mood:  unable to assess  Affect:  Tearful at times  Thought Process:  NA  Orientation:  NA  Thought Content:  NA  Suicidal Thoughts:  unable to assess  Homicidal Thoughts:  unable to assess  Memory:  unable to assess  Judgement:  Impaired  Insight:  unable to assess  Psychomotor Activity:  Decreased  Concentration: Unable to assess  Recall:  unable to assess  Fund of Knowledge:  unable to assess  Language:  Negative  Akathisia:  Negative  Handed:  Unable to assess  AIMS (if indicated):     Assets:  Housing Intimacy Leisure Time Resilience Social Support  ADL's:  Impaired  Cognition:  Impaired,  Severe  Sleep:      Treatment Plan Summary: Daily contact with patient to assess and evaluate symptoms and progress in treatment, Medication management and Plan catatonia related to mental issue:  -Ativan discontinued, last dose was yesterday -Started Zyprexa 5 mg BID IM  Bipolar affective disorder, depressed, severe: -Continued Lamictal 100 mg daily -Discontiued Risperdal 2 mg at bedtime -Continued Cymbalta 60 mg daily  Anxiety and neuropathic pain: -Decreased gabapentin 600 mg TID to 400 mg TID  HTN: -Continued lisinopril 10 mg  daily  Disposition: Recommend psychiatric Inpatient admission when medically cleared. Supportive therapy provided about ongoing stressors.  Nanine MeansJamison Lord, NP 07/09/2019 11:54 AM

## 2019-07-10 MED ORDER — METOPROLOL TARTRATE 25 MG PO TABS
25.0000 mg | ORAL_TABLET | Freq: Two times a day (BID) | ORAL | Status: DC
  Administered 2019-07-11 – 2019-07-12 (×4): 25 mg via ORAL
  Filled 2019-07-10 (×5): qty 1

## 2019-07-10 NOTE — Progress Notes (Signed)
[employer] - [place] [facility]   PATIENT NAME: Debbie PatienceHolly Golden    MR#:  [id number]  DATE OF BIRTH:  [birth year]  SUBJECTIVE:  CHIEF COMPLAINT:   Chief Complaint  Patient presents with  . Psychiatric Evaluation   Patient admitted to medical service, have catatonia- managed by psychiatry services. Was catatonic and not talking at all.  Not following command.  REVIEW OF SYSTEMS:  Pt is not talking.  ROS  DRUG ALLERGIES:  No Known Allergies  VITALS:  Blood pressure 127/85, pulse 86, temperature 97.6 F (36.4 C), temperature source Oral, resp. rate 20, height 5\' 8"  (1.727 m), weight 119.1 kg, SpO2 98 %.  PHYSICAL EXAMINATION:   Constitutional: She appears well-developed and well-nourished. No distress.  HENT:  Head: Normocephalic and atraumatic.  Mouth/Throat: Oropharynx is clear and moist.  Eyes: Pupils are equal, round, and reactive to light. Conjunctivae and EOM are normal. No scleral icterus.  Neck: Normal range of motion. Neck supple. No JVD present. No thyromegaly present.  Cardiovascular: Normal rate, regular rhythm and intact distal pulses. Exam reveals no gallop and no friction rub.  No murmur heard. Respiratory: Effort normal and breath sounds normal. No respiratory distress. She has no wheezes. She has no rales.  GI: Soft. Bowel sounds are normal. She exhibits no distension. There is no abdominal tenderness.  Musculoskeletal: Normal range of motion.        General: No edema.     Comments: No arthritis, no gout  Lymphadenopathy:    She has no cervical adenopathy.  Neurological:  Unable to assess due to patient condition  Skin: Skin is warm and dry. No rash noted. No erythema.  Psychiatric:  Unable to assess due to patient condition   Physical Exam LABORATORY PANEL:   CBC Recent Labs  Lab 07/09/19 0450  WBC 4.9  HGB 10.9*  HCT 35.0*  PLT 184    ------------------------------------------------------------------------------------------------------------------  Chemistries  Recent Labs  Lab 07/07/19 0840 07/09/19 0450  NA 138 144  K 4.4 3.7  CL 103 113*  CO2 24 20*  GLUCOSE 133* 108*  BUN 18 10  CREATININE 1.32* 0.96  CALCIUM 9.7 9.6  AST 30  --   ALT 20  --   ALKPHOS 159*  --   BILITOT 1.0  --    ------------------------------------------------------------------------------------------------------------------  Cardiac Enzymes No results for input(s): TROPONINI in the last 168 hours. ------------------------------------------------------------------------------------------------------------------  RADIOLOGY:  Dg Chest Port 1 View  Result Date: 07/08/2019 CLINICAL DATA:  Leukocytosis.  Fever. EXAM: PORTABLE CHEST 1 VIEW COMPARISON:  None. FINDINGS: The heart size and mediastinal contours are within normal limits. Both lungs are clear. The visualized skeletal structures are unremarkable. IMPRESSION: No active disease. Electronically Signed   By: Gerome Samavid  Williams III M.D   On: 07/08/2019 19:46    ASSESSMENT AND PLAN:   Principal Problem:   Severe sepsis (HCC) Active Problems:   GERD (gastroesophageal reflux disease)   Catatonia   UTI (urinary tract infection)   AKI (acute kidney injury) (HCC)   Severe sepsis (HCC) -lactic acid within normal limits, blood pressure stable, IV antibiotics given, cultures sent ,  Urine culture report reviewed with E. Coli. Currently she is not able to take anything orally because of her catatonia so we will continue treating with IV antibiotics and I would suggest to finish total 5 days of antibiotic course.    Catatonia associated with another mental disorder -psychiatry following, defer to their recommendations for treatment of this problem -started on Zyprexa  IM. As patient is currently not eating or taking any oral meds I would continue IV fluids for supportive treatment.   Patient is medically stable for transfer to psychiatric floor or if not able to take oral interferes taking care of her on the psychiatric floor then they can continue further management on medical floor.Marland Kitchen    UTI (urinary tract infection) -IV antibiotics and culture as above    AKI (acute kidney injury) ([place]) -IV fluids tonight, avoid nephrotoxins and monitor    Kidney function improved now.    All the records are reviewed and case discussed with Care Management/Social Workerr. Management plans discussed with the patient, family and they are in agreement.  CODE STATUS: full.  TOTAL TIME TAKING CARE OF THIS PATIENT: 35 minutes.     POSSIBLE D/C IN 1-2 DAYS, DEPENDING ON CLINICAL CONDITION.   Vaughan Basta M.D on 07/10/2019   Between 7am to 6pm - Pager - [phone]  After 6pm go to [email or web address] - password [email or web address] [facility] [facility]  Office  [phone]  CC: Primary care physician; Hubbard Hartshorn, FNP  Note: This dictation was prepared with Dragon dictation along with smaller phrase technology. Any transcriptional errors that result from this process are unintentional.

## 2019-07-10 NOTE — Consult Note (Signed)
[facility] Face-to-Face Psychiatry Consult   Reason for Consult:  Catatonia  Referring Physician:  EDP Patient Identification: Debbie Golden MRN:  [id number] Principal Diagnosis: Bipolar affective disorder, depressed, severe  Diagnosis:  Principal Problem:   Severe sepsis ([place]) Active Problems:   Catatonia   GERD (gastroesophageal reflux disease)   UTI (urinary tract infection)   AKI (acute kidney injury) ([place])  Total Time spent with patient: 30 minutes  Subjective:   Debbie Golden is a 48 y.o. female patient admitted with catatonia.  She was admitted to the medical floor after developing a fever and being treated for sepsis related to a UTI.  Started Zyprexa IM BID to assist with her catatonia at this time because Ativan in the ED multiple times had little effect.  Patient could potentially be suffering from delirium related to her current sepsis vs catatonia.  Today, she did take a few sips for her sitter but remains with eyes closed with no verbal response or movement but sporadically will suddenly sit up with her eyes closed and make a drinking motion with her right hand or stick her middle finger up a few times and say, "Not today bitch!"  Then, lies back down.  Sweaty at times, incontinent (large BM earlier), IV fluids in place, and angry faces at times.  She has a history of alcohol abuse but negative on admission and in January.    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 Ativan provided regularly with some relief, crying at tears.  Later this morning she told the nurse she did not want an injection but when he offered the pill form of Ativan, she refused it.  Continues to lie in bed with minimal movement, typically with her eyes.  She will look at you at times when talking.  Ativan changed to Zyprexa to see if this will improve her symptoms better.  Past  Psychiatric History: bipolar disorder  Risk to Self:  impaired Risk to Others:  none Prior Inpatient Therapy:  yes Prior Outpatient Therapy:  yes  Past Medical History:  Past Medical History:  Diagnosis Date  . Bipolar disorder ([place])   . Depression   . Genital herpes   . Obesity   . Weakness     Past Surgical History:  Procedure Laterality Date  . ANKLE FRACTURE SURGERY Right   . GASTRIC BYPASS N/A 2007  . ORIF ANKLE FRACTURE Left 04/02/2018   Procedure: OPEN REDUCTION INTERNAL FIXATION (ORIF) ANKLE FRACTURE;  Surgeon: Earnestine Leys, MD;  Location: [facility] ORS;  Service: Orthopedics;  Laterality: Left;   Family History:  Family History  Adopted: Yes   Family Psychiatric  History: none Social History:  Social History   Substance and Sexual Activity  Alcohol Use Yes   Comment: 3 drinks per week     Social History   Substance and Sexual Activity  Drug Use No    Social History   Socioeconomic History  . Marital status: Single    Spouse name: Not on file  . Number of children: 0  . Years of education: 61  . Highest education level: Master's degree (e.g., MA, MS, MEng, MEd, MSW, MBA)  Occupational History  . Occupation: Press photographer  Social Needs  . Financial resource strain: Somewhat hard  . Food insecurity    Worry: Sometimes true    Inability: Never  true  . Transportation needs    Medical: No    Non-medical: Yes  Tobacco Use  . Smoking status: Never Smoker  . Smokeless tobacco: Never Used  Substance and Sexual Activity  . Alcohol use: Yes    Comment: 3 drinks per week  . Drug use: No  . Sexual activity: Not on file  Lifestyle  . Physical activity    Days per week: 0 days    Minutes per session: 0 min  . Stress: Very much  Relationships  . Social connections    Talks on phone: More than three times a week    Gets together: More than three times a week    Attends religious service: Never    Active member of club or organization: Yes    Attends  meetings of clubs or organizations: Never    Relationship status: Living with partner  Other Topics Concern  . Not on file  Social History Narrative   Lives at home with her fiance, Alinda Money.   Right-handed.   One cup caffeine per day.   Additional Social History:    Allergies:  No Known Allergies  Labs:  Results for orders placed or performed during the hospital encounter of 07/07/19 (from the past 48 hour(s))  SARS Coronavirus 2 (CEPHEID- Performed in [facility] hospital lab), Hosp Order     Status: None   Collection Time: 07/08/19  7:00 PM   Specimen: Nasopharyngeal Swab  Result Value Ref Range   SARS Coronavirus 2 NEGATIVE NEGATIVE    Comment: (NOTE) If result is NEGATIVE SARS-CoV-2 target nucleic acids are NOT DETECTED. The SARS-CoV-2 RNA is generally detectable in upper and lower  respiratory specimens during the acute phase of infection. The lowest  concentration of SARS-CoV-2 viral copies this assay can detect is 250  copies / mL. A negative result does not preclude SARS-CoV-2 infection  and should not be used as the sole basis for treatment or other  patient management decisions.  A negative result may occur with  improper specimen collection / handling, submission of specimen other  than nasopharyngeal swab, presence of viral mutation(s) within the  areas targeted by this assay, and inadequate number of viral copies  (<250 copies / mL). A negative result must be combined with clinical  observations, patient history, and epidemiological information. If result is POSITIVE SARS-CoV-2 target nucleic acids are DETECTED. The SARS-CoV-2 RNA is generally detectable in upper and lower  respiratory specimens dur ing the acute phase of infection.  Positive  results are indicative of active infection with SARS-CoV-2.  Clinical  correlation with patient history and other diagnostic information is  necessary to determine patient infection status.  Positive results do  not rule out  bacterial infection or co-infection with other viruses. If result is PRESUMPTIVE POSTIVE SARS-CoV-2 nucleic acids MAY BE PRESENT.   A presumptive positive result was obtained on the submitted specimen  and confirmed on repeat testing.  While 2019 novel coronavirus  (SARS-CoV-2) nucleic acids may be present in the submitted sample  additional confirmatory testing may be necessary for epidemiological  and / or clinical management purposes  to differentiate between  SARS-CoV-2 and other Sarbecovirus currently known to infect humans.  If clinically indicated additional testing with an alternate test  methodology [phone]) is advised. The SARS-CoV-2 RNA is generally  detectable in upper and lower respiratory sp ecimens during the acute  phase of infection. The expected result is Negative. Fact Sheet for Patients:  BoilerBrush.com.cy Fact Sheet for  Healthcare Providers: https://pope.com/https://www.fda.gov/media/136313/download This test is not yet approved or cleared by the [place] FDA and has been authorized for detection and/or diagnosis of SARS-CoV-2 by FDA under an Emergency Use Authorization (EUA).  This EUA will remain in effect (meaning this test can be used) for the duration of the COVID-19 declaration under Section 564(b)(1) of the Act, 21 U.S.C. section 360bbb-3(b)(1), unless the authorization is terminated or revoked sooner. Performed at [facility], [address] [place], [place] [address]   Lactic acid, plasma     Status: None   Collection Time: 07/08/19  7:12 PM  Result Value Ref Range   Lactic Acid, Venous 0.9 0.5 - 1.9 mmol/L    Comment: Performed at [facility], [address] [place], [place] [address]  Blood Culture (routine x 2)     Status: None (Preliminary result)   Collection Time: 07/08/19  7:12 PM   Specimen: BLOOD  Result Value Ref Range   Specimen Description BLOOD RIGHT ANTECUBITAL    Special Requests      BOTTLES  DRAWN AEROBIC AND ANAEROBIC Blood Culture adequate volume   Culture      NO GROWTH 2 DAYS Performed at [facility], [address] [place], [place] [address]    Report Status PENDING   Blood Culture (routine x 2)     Status: None (Preliminary result)   Collection Time: 07/08/19  7:12 PM   Specimen: BLOOD  Result Value Ref Range   Specimen Description BLOOD LEFT ANTECUBITAL    Special Requests      BOTTLES DRAWN AEROBIC AND ANAEROBIC Blood Culture results may not be optimal due to an excessive volume of blood received in culture bottles   Culture      NO GROWTH 2 DAYS Performed at [facility], [address] [place], [place] [address]    Report Status PENDING   Basic metabolic panel     Status: Abnormal   Collection Time: 07/09/19  4:50 AM  Result Value Ref Range   Sodium 144 135 - 145 mmol/L   Potassium 3.7 3.5 - 5.1 mmol/L   Chloride 113 (H) 98 - 111 mmol/L   CO2 20 (L) 22 - 32 mmol/L   Glucose, Bld 108 (H) 70 - 99 mg/dL   BUN 10 6 - 20 mg/dL   Creatinine, Ser 9.620.96 0.44 - 1.00 mg/dL   Calcium 9.6 8.9 - 95.210.3 mg/dL   GFR calc non Af Amer >60 >60 mL/min   GFR calc Af Amer >60 >60 mL/min   Anion gap 11 5 - 15    Comment: Performed at [facility], [address] [place], [place] [address]  CBC     Status: Abnormal   Collection Time: 07/09/19  4:50 AM  Result Value Ref Range   WBC 4.9 4.0 - 10.5 K/uL   RBC 3.51 (L) 3.87 - 5.11 MIL/uL   Hemoglobin 10.9 (L) 12.0 - 15.0 g/dL   HCT 44.035.0 (L) 10.236.0 - 72.546.0 %   MCV 99.7 80.0 - 100.0 fL   MCH 31.1 26.0 - 34.0 pg   MCHC 31.1 30.0 - 36.0 g/dL   RDW 36.616.8 (H) 44.011.5 - 34.715.5 %   Platelets 184 150 - 400 K/uL   nRBC 0.0 0.0 - 0.2 %    Comment: Performed at [facility], [address] [place], [place] [address]    Current Facility-Administered Medications  Medication Dose Route Frequency Provider Last Rate Last Dose  . 0.9 %  sodium chloride infusion   Intravenous Continuous Altamese DillingVachhani,  Vaibhavkumar, MD 75 mL/hr at 07/09/19 2346    . acetaminophen (TYLENOL) tablet 650 mg  650 mg Oral Q6H PRN Oralia ManisWillis, David, MD      . cefTRIAXone (ROCEPHIN) 1 g in sodium chloride 0.9 % 100 mL IVPB  1 g Intravenous Q24H Oralia ManisWillis, David, MD 200 mL/hr at 07/09/19 2354 1 g at 07/09/19 2354  . DULoxetine (CYMBALTA) DR capsule 60 mg  60 mg Oral Daily Oralia ManisWillis, David, MD   Stopped at 07/07/19 2337  . enoxaparin (LOVENOX) injection 40 mg  40 mg Subcutaneous Q12H Oralia ManisWillis, David, MD   40 mg at 07/10/19 1003  . folic acid injection 1 mg  1 mg Intravenous Daily Oralia ManisWillis, David, MD   1 mg at 07/10/19 1004  . gabapentin (NEURONTIN) capsule 400 mg  400 mg Oral TID Altamese DillingVachhani, Vaibhavkumar, MD      . ketorolac (TORADOL) 30 MG/ML injection 30 mg  30 mg Intravenous Q6H PRN [place] SirenSainani, Vivek J, MD   30 mg at 07/09/19 2025  . labetalol (NORMODYNE) injection 10 mg  10 mg Intravenous Q2H PRN Oralia ManisWillis, David, MD   10 mg at 07/09/19 2334  . lamoTRIgine (LAMICTAL) tablet 100 mg  100 mg Oral Daily Oralia ManisWillis, David, MD   Stopped at 07/07/19 2339  . LORazepam (ATIVAN) injection 1 mg  1 mg Intravenous Once Oralia ManisWillis, David, MD      . metoprolol tartrate (LOPRESSOR) tablet 25 mg  25 mg Oral BID Altamese DillingVachhani, Vaibhavkumar, MD      . OLANZapine [facility](ZYPREXA) injection 5 mg  5 mg Intramuscular BID Charm RingsLord, Ryoma Nofziger Y, NP   5 mg at 07/10/19 1001   Or  . OLANZapine (ZYPREXA) tablet 5 mg  5 mg Oral BID Charm RingsLord, Sheehan Stacey Y, NP      . ondansetron [facility](ZOFRAN) tablet 4 mg  4 mg Oral Q6H PRN Oralia ManisWillis, David, MD       Or  . ondansetron [facility](ZOFRAN) injection 4 mg  4 mg Intravenous Q6H PRN Oralia ManisWillis, David, MD        Musculoskeletal: Strength & Muscle Tone: decreased Gait & Station: did not walk Patient leans: N/A  Psychiatric Specialty Exam: Physical Exam  Nursing note and vitals reviewed. Constitutional: She appears well-developed and well-nourished.  HENT:  Head: Normocephalic.  Respiratory: Effort normal.  Neurological: She is alert.  Psychiatric: Her affect is blunt.  She is slowed. Cognition and memory are impaired. She expresses inappropriate judgment. She is noncommunicative.    Review of Systems  All other systems reviewed and are negative.   Blood pressure 127/85, pulse 86, temperature 97.6 F (36.4 C), temperature source Oral, resp. rate 20, height 5\' 8"  (1.727 m), weight 119.1 kg, SpO2 98 %.Body mass index is 39.91 kg/m.  General Appearance: Casual  Eye Contact:  Fair  Speech:  Negative  Volume:  No speech  Mood:  unable to assess  Affect:  Tearful at times  Thought Process:  NA  Orientation:  NA  Thought Content:  NA  Suicidal Thoughts:  unable to assess  Homicidal Thoughts:  unable to assess  Memory:  unable to assess  Judgement:  Impaired  Insight:  unable to assess  Psychomotor Activity:  Decreased  Concentration: Unable to assess  Recall:  unable to assess  Fund of Knowledge:  unable to assess  Language:  Negative  Akathisia:  Negative  Handed:  Unable to assess  AIMS (if indicated):     Assets:  Housing Intimacy Leisure Time Resilience  Social Support  ADL's:  Impaired  Cognition:  Impaired,  Severe  Sleep:      Treatment Plan Summary: Daily contact with patient to assess and evaluate symptoms and progress in treatment, Medication management and Plan catatonia related to mental issue:  -Continued Zyprexa 5 mg BID IM  R/O Delirium related to sepsis: Patient likely has ongoing delirium which presents as fluctuating cognition.  At time of this assessment patient has full capacity.  The patient's exam is notable for altered sensorium, perceptual disturbances, disorientation and cognitive deficits that appear markedly different than their baseline, suggesting a diagnosis of delirium.  Virtually any medical condition or physiologic stress can precipitate delirium in a susceptible individual, with risk increasing in those with: advanced age, sensory impairments, organic brain disease (stroke, dementia, Parkinsons), psychiatric  illness, major chronic medical issues, prolonged hospitalizations, postoperative status, anemia, insomnia/disturbed sleep, and severe pain. Addressing the underlying medical condition and institution of preventative measures are recommended.  - Continue to monitor and treat underlying medical causes of delirium, including infection, electrolyte disturbances, etc. - Delirium precautions - Minimize/avoid deliriogenic meds including: anticholinergic, opiates, benzodiazepines           - Maintain hydration, oxygenation, nutrition           - Limit use of restraints and catheters           - Normalize sleep patterns by minimizing nighttime noise, light and interruptions by     -Ensure sleep apnea treatment is provided overnight.             clustering care, opening blinds during the day           - Reorient the patient frequently, provide easily visible clock and calendar           - Provide sensory aids like glasses, hearing aids           - Encourage ambulation, regular activities and visitors to maintain cognitive stimulation   -Patient would benefit from having family members at bedside to reinforce his orientation.  Bipolar affective disorder, depressed, severe: -Continued Lamictal 100 mg daily -Continued Cymbalta 60 mg daily  Anxiety and neuropathic pain: -Continued gabapentin 400 mg TID  HTN: -Continued lisinopril 10 mg daily  Disposition: Recommend psychiatric Inpatient admission when medically cleared. Supportive therapy provided about ongoing stressors.  Nanine MeansJamison Jule Whitsel, NP 07/10/2019 1:34 PM

## 2019-07-10 NOTE — Plan of Care (Signed)
Patient has a Air cabin crew. She is very ridged and moan cries and appears upset at times. Patient refused being turned. Denied being in pain. She selectively responds to questions by nodding or shaking her head. IV medication given, she is taking nothing by mouth at this time.    Problem: Education: Goal: Knowledge of General Education information will improve Description: Including pain rating scale, medication(s)/side effects and non-pharmacologic comfort measures Outcome: Not Progressing   Problem: Health Behavior/Discharge Planning: Goal: Ability to manage health-related needs will improve Outcome: Not Progressing   Problem: Clinical Measurements: Goal: Ability to maintain clinical measurements within normal limits will improve Outcome: Not Progressing Goal: Will remain free from infection Outcome: Not Progressing Goal: Diagnostic test results will improve Outcome: Not Progressing Goal: Respiratory complications will improve Outcome: Not Progressing Goal: Cardiovascular complication will be avoided Outcome: Not Progressing   Problem: Activity: Goal: Risk for activity intolerance will decrease Outcome: Not Progressing   Problem: Nutrition: Goal: Adequate nutrition will be maintained Outcome: Not Progressing   Problem: Coping: Goal: Level of anxiety will decrease Outcome: Not Progressing   Problem: Elimination: Goal: Will not experience complications related to bowel motility Outcome: Not Progressing Goal: Will not experience complications related to urinary retention Outcome: Not Progressing   Problem: Pain Managment: Goal: General experience of comfort will improve Outcome: Not Progressing   Problem: Safety: Goal: Ability to remain free from injury will improve Outcome: Not Progressing   Problem: Skin Integrity: Goal: Risk for impaired skin integrity will decrease Outcome: Not Progressing

## 2019-07-11 DIAGNOSIS — F3130 Bipolar disorder, current episode depressed, mild or moderate severity, unspecified: Secondary | ICD-10-CM

## 2019-07-11 MED ORDER — LORAZEPAM 2 MG/ML IJ SOLN
1.0000 mg | Freq: Once | INTRAMUSCULAR | Status: AC
  Administered 2019-07-11: 10:00:00 1 mg via INTRAVENOUS
  Filled 2019-07-11: qty 1

## 2019-07-11 MED ORDER — LORAZEPAM 2 MG/ML IJ SOLN: 1.0000 mg | INTRAMUSCULAR | Status: DC | PRN

## 2019-07-11 NOTE — Progress Notes (Signed)
[employer] [facility] [place] [facility]   PATIENT NAME: Debbie Golden    MR#:  [id number]  DATE OF BIRTH:  [birth year]  SUBJECTIVE:  CHIEF COMPLAINT:   Chief Complaint  Patient presents with  . Psychiatric Evaluation   Patient admitted to medical service, have catatonia- managed by psychiatry services. The patient was agitated and completely hallucinating.  REVIEW OF SYSTEMS:  Pt is not talking.  ROS  DRUG ALLERGIES:  No Known Allergies  VITALS:  Blood pressure (!) 150/87, pulse 90, temperature 97.6 F (36.4 C), temperature source Oral, resp. rate 19, height 5\' 8"  (1.727 m), weight 119.1 kg, SpO2 100 %.  PHYSICAL EXAMINATION:   Constitutional: She appears well-developed and well-nourished. No distress.  HENT:  Head: Normocephalic and atraumatic.  Mouth/Throat: Oropharynx is clear and moist.  Eyes: Pupils are equal, round, and reactive to light. Conjunctivae and EOM are normal. No scleral icterus.  Neck: Normal range of motion. Neck supple. No JVD present. No thyromegaly present.  Cardiovascular: Normal rate, regular rhythm and intact distal pulses. Exam reveals no gallop and no friction rub.  No murmur heard. Respiratory: Effort normal and breath sounds normal. No respiratory distress. She has no wheezes. She has no rales.  GI: Soft. Bowel sounds are normal. She exhibits no distension. There is no abdominal tenderness.  Musculoskeletal: Normal range of motion.        General: No edema.     Comments: No arthritis, no gout  Lymphadenopathy:    She has no cervical adenopathy.  Neurological:   Moving all 4 limbs but appears very anxious and hallucinating Skin: Skin is warm and dry. No rash noted. No erythema.  Psychiatric:  Unable to assess due to patient condition   Physical Exam LABORATORY PANEL:   CBC Recent Labs  Lab 07/09/19 0450  WBC 4.9  HGB 10.9*  HCT 35.0*  PLT 184    ------------------------------------------------------------------------------------------------------------------  Chemistries  Recent Labs  Lab 07/07/19 0840 07/09/19 0450  NA 138 144  K 4.4 3.7  CL 103 113*  CO2 24 20*  GLUCOSE 133* 108*  BUN 18 10  CREATININE 1.32* 0.96  CALCIUM 9.7 9.6  AST 30  --   ALT 20  --   ALKPHOS 159*  --   BILITOT 1.0  --    ------------------------------------------------------------------------------------------------------------------  Cardiac Enzymes No results for input(s): TROPONINI in the last 168 hours. ------------------------------------------------------------------------------------------------------------------  RADIOLOGY:  No results found.  ASSESSMENT AND PLAN:   Principal Problem:   Severe sepsis (HCC) Active Problems:   GERD (gastroesophageal reflux disease)   Catatonia   UTI (urinary tract infection)   AKI (acute kidney injury) (HCC)   Severe sepsis (HCC) -lactic acid within normal limits, blood pressure stable, IV antibiotics given, cultures sent ,  Urine culture report reviewed with E. Coli. Currently she is not able to take anything orally because of her catatonia so we will continue treating with IV antibiotics and I would suggest to finish total 5 days of antibiotic course.    Catatonia   -psychiatry following, defer to their recommendations for treatment of this problem -started on Zyprexa IM. As patient is currently not eating or taking any oral meds I would continue IV fluids for supportive treatment.  Patient is medically stable for transfer to psychiatric floor or if not able to take oral interferes taking care of her on the psychiatric floor then they can continue further management on medical floor.Marland Kitchen.    UTI (urinary tract infection) -IV antibiotics and  culture as above    AKI (acute kidney injury) ([place]) -IV fluids tonight, avoid nephrotoxins and monitor    Kidney function improved now.    All the  records are reviewed and case discussed with Care Management/Social Workerr. Management plans discussed with the patient, family and they are in agreement.  CODE STATUS: full.  TOTAL TIME TAKING CARE OF THIS PATIENT: 35 minutes.     POSSIBLE D/C IN 1-2 DAYS, DEPENDING ON CLINICAL CONDITION.   Vaughan Basta M.D on 07/11/2019   Between 7am to 6pm - Pager - [phone]  After 6pm go to [email or web address] - password [email or web address] [place] [facility]  Office  [phone]  CC: Primary care physician; Hubbard Hartshorn, FNP  Note: This dictation was prepared with Dragon dictation along with smaller phrase technology. Any transcriptional errors that result from this process are unintentional.

## 2019-07-11 NOTE — Plan of Care (Signed)
Patient is awake and talking this shift. She A=Ox1. Patient believes she is on an international 1st class flight and wants to know when the plane will be landing. She stated she was on the wrong flight. She then began to talk to to people not present. She is intermittently aware of where she is for less than 5 min at a time, and able to briefly understand what she is being asked. She respond that she would take her medication, but then closed her mouth as tightly as possible when I went to give her the cup.   Problem: Education: Goal: Knowledge of General Education information will improve Description: Including pain rating scale, medication(s)/side effects and non-pharmacologic comfort measures Outcome: Progressing   Problem: Health Behavior/Discharge Planning: Goal: Ability to manage health-related needs will improve Outcome: Progressing   Problem: Clinical Measurements: Goal: Ability to maintain clinical measurements within normal limits will improve Outcome: Progressing Goal: Will remain free from infection Outcome: Progressing Goal: Diagnostic test results will improve Outcome: Progressing Goal: Respiratory complications will improve Outcome: Progressing Goal: Cardiovascular complication will be avoided Outcome: Progressing   Problem: Activity: Goal: Risk for activity intolerance will decrease Outcome: Progressing   Problem: Nutrition: Goal: Adequate nutrition will be maintained Outcome: Progressing   Problem: Coping: Goal: Level of anxiety will decrease Outcome: Progressing   Problem: Elimination: Goal: Will not experience complications related to bowel motility Outcome: Progressing Goal: Will not experience complications related to urinary retention Outcome: Progressing   Problem: Pain Managment: Goal: General experience of comfort will improve Outcome: Progressing   Problem: Safety: Goal: Ability to remain free from injury will improve Outcome: Progressing    Problem: Skin Integrity: Goal: Risk for impaired skin integrity will decrease Outcome: Progressing

## 2019-07-11 NOTE — Consult Note (Signed)
[facility] Face-to-Face Psychiatry Consult   Reason for Consult:  Catatonia  Referring Physician:  EDP Patient Identification: Debbie Golden MRN:  [id number] Principal Diagnosis: Bipolar affective disorder, depressed, severe  Diagnosis:  Principal Problem:   Severe sepsis ([place]) Active Problems:   GERD (gastroesophageal reflux disease)   Catatonia   UTI (urinary tract infection)   AKI (acute kidney injury) ([place])  Total Time spent with patient: 20 minutes  Subjective:   Debbie Golden is a 49 y.o. female patient admitted with catatonia.  She was admitted to the medical floor after developing a fever and being treated for sepsis related to a UTI.  Started Zyprexa IM BID to assist with her catatonia at this time because Ativan in the ED multiple times had little effect.  Patient could potentially be suffering from delirium related to her current sepsis vs catatonia.   Patient was seen this morning and she continues to lie in bed with minimal movement.  She is oriented to her name and the month and the year.  States that she is in the hospital because of her neuropathy.  She is unable to say much else.  On talking to her nurse she reports that patient has a few minutes of lucid intervals but this morning she tried to run out of her room and tried to disrobe and to touch herself in her private areas.  She continues to refuse any medications and is being given IM and IV medications at this time.    Past Psychiatric History: bipolar disorder  Risk to Self:  impaired Risk to Others:  none Prior Inpatient Therapy:  yes Prior Outpatient Therapy:  yes  Past Medical History:  Past Medical History:  Diagnosis Date  . Bipolar disorder ([place])   . Depression   . Genital herpes   . Obesity   . Weakness     Past Surgical History:  Procedure Laterality Date  . ANKLE FRACTURE SURGERY Right   . GASTRIC BYPASS N/A 2007  . ORIF ANKLE FRACTURE Left 04/02/2018   Procedure: OPEN REDUCTION INTERNAL FIXATION (ORIF) ANKLE  FRACTURE;  Surgeon: Earnestine Leys, MD;  Location: [facility] ORS;  Service: Orthopedics;  Laterality: Left;   Family History:  Family History  Adopted: Yes   Family Psychiatric  History: none Social History:  Social History   Substance and Sexual Activity  Alcohol Use Yes   Comment: 3 drinks per week     Social History   Substance and Sexual Activity  Drug Use No    Social History   Socioeconomic History  . Marital status: Single    Spouse name: Not on file  . Number of children: 0  . Years of education: 39  . Highest education level: Master's degree (e.g., MA, MS, MEng, MEd, MSW, MBA)  Occupational History  . Occupation: Press photographer  Social Needs  . Financial resource strain: Somewhat hard  . Food insecurity    Worry: Sometimes true    Inability: Never true  . Transportation needs    Medical: No    Non-medical: Yes  Tobacco Use  . Smoking status: Never Smoker  . Smokeless tobacco: Never Used  Substance and Sexual Activity  . Alcohol use: Yes    Comment: 3 drinks per week  . Drug use: No  . Sexual activity: Not on file  Lifestyle  . Physical activity    Days per week: 0 days    Minutes per session: 0 min  . Stress: Very much  Relationships  .  Social connections    Talks on phone: More than three times a week    Gets together: More than three times a week    Attends religious service: Never    Active member of club or organization: Yes    Attends meetings of clubs or organizations: Never    Relationship status: Living with partner  Other Topics Concern  . Not on file  Social History Narrative   Lives at home with her fiance, Alinda Moneyony.   Right-handed.   One cup caffeine per day.   Additional Social History:    Allergies:  No Known Allergies  Labs:  No results found for this or any previous visit (from the past 48 hour(s)).  Current Facility-Administered Medications  Medication Dose Route Frequency Provider Last Rate Last Dose  . 0.9 %  sodium  chloride infusion   Intravenous Continuous Altamese DillingVachhani, Vaibhavkumar, MD   Stopped at 07/11/19 [phone]  . acetaminophen (TYLENOL) tablet 650 mg  650 mg Oral Q6H PRN Oralia ManisWillis, David, MD      . cefTRIAXone (ROCEPHIN) 1 g in sodium chloride 0.9 % 100 mL IVPB  1 g Intravenous Q24H Oralia ManisWillis, David, MD 200 mL/hr at 07/10/19 2335 1 g at 07/10/19 2335  . DULoxetine (CYMBALTA) DR capsule 60 mg  60 mg Oral Daily Oralia ManisWillis, David, MD   Stopped at 07/07/19 2337  . enoxaparin (LOVENOX) injection 40 mg  40 mg Subcutaneous Q12H Oralia ManisWillis, David, MD   40 mg at 07/10/19 2117  . folic acid injection 1 mg  1 mg Intravenous Daily Oralia ManisWillis, David, MD   1 mg at 07/11/19 0956  . gabapentin (NEURONTIN) capsule 400 mg  400 mg Oral TID Altamese DillingVachhani, Vaibhavkumar, MD      . ketorolac (TORADOL) 30 MG/ML injection 30 mg  30 mg Intravenous Q6H PRN [place] SirenSainani, Vivek J, MD   30 mg at 07/09/19 2025  . labetalol (NORMODYNE) injection 10 mg  10 mg Intravenous Q2H PRN Oralia ManisWillis, David, MD   10 mg at 07/09/19 2334  . lamoTRIgine (LAMICTAL) tablet 100 mg  100 mg Oral Daily Oralia ManisWillis, David, MD   Stopped at 07/07/19 2339  . LORazepam (ATIVAN) injection 1 mg  1 mg Intramuscular Q4H PRN Altamese DillingVachhani, Vaibhavkumar, MD      . metoprolol tartrate (LOPRESSOR) tablet 25 mg  25 mg Oral BID Altamese DillingVachhani, Vaibhavkumar, MD      . OLANZapine [facility](ZYPREXA) injection 5 mg  5 mg Intramuscular BID Charm RingsLord, Jamison Y, NP   5 mg at 07/11/19 0813   Or  . OLANZapine (ZYPREXA) tablet 5 mg  5 mg Oral BID Charm RingsLord, Jamison Y, NP      . ondansetron [facility](ZOFRAN) tablet 4 mg  4 mg Oral Q6H PRN Oralia ManisWillis, David, MD       Or  . ondansetron [facility](ZOFRAN) injection 4 mg  4 mg Intravenous Q6H PRN Oralia ManisWillis, David, MD        Musculoskeletal: Strength & Muscle Tone: decreased Gait & Station: did not walk Patient leans: N/A  Psychiatric Specialty Exam: Physical Exam  Nursing note and vitals reviewed. Constitutional: She appears well-developed and well-nourished.  HENT:  Head: Normocephalic.  Respiratory: Effort normal.   Neurological: She is alert.  Psychiatric: Her affect is blunt. She is slowed. Cognition and memory are impaired. She expresses inappropriate judgment. She is noncommunicative.    Review of Systems  All other systems reviewed and are negative.   Blood pressure (!) 150/87, pulse 90, temperature 97.6 F (36.4 C), temperature source Oral, resp. rate 19, height  5\' 8"  (1.727 m), weight 119.1 kg, SpO2 100 %.Body mass index is 39.91 kg/m.  General Appearance: Casual  Eye Contact:  Fair  Speech: Minimal speech  Volume:  Soft and slow  Mood:  unable to assess  Affect: Blunted  Thought Process:  NA  Orientation:  NA  Thought Content:  NA  Suicidal Thoughts:  unable to assess  Homicidal Thoughts:  unable to assess  Memory:  unable to assess  Judgement:  Impaired  Insight:  unable to assess  Psychomotor Activity:  Decreased  Concentration: Unable to assess  Recall:  unable to assess  Fund of Knowledge:  unable to assess  Language:  Negative  Akathisia:  Negative  Handed:  Unable to assess  AIMS (if indicated):     Assets:  Housing Intimacy Leisure Time Resilience Social Support  ADL's:  Impaired  Cognition:  Impaired,  Severe  Sleep:      Treatment Plan Summary: Daily contact with patient to assess and evaluate symptoms and progress in treatment, Medication management and Plan catatonia related to mental issue:  -Continued Zyprexa 5 mg BID IM  R/O Delirium related to sepsis: Patient likely has ongoing delirium which presents as fluctuating cognition.  At time of this assessment patient has full capacity.  The patient's exam is notable for altered sensorium, perceptual disturbances, disorientation and cognitive deficits that appear markedly different than their baseline, suggesting a diagnosis of delirium.  Virtually any medical condition or physiologic stress can precipitate delirium in a susceptible individual, with risk increasing in those with: advanced age, sensory  impairments, organic brain disease (stroke, dementia, Parkinsons), psychiatric illness, major chronic medical issues, prolonged hospitalizations, postoperative status, anemia, insomnia/disturbed sleep, and severe pain. Addressing the underlying medical condition and institution of preventative measures are recommended.  - Continue to monitor and treat underlying medical causes of delirium, including infection, electrolyte disturbances, etc. - Delirium precautions - Minimize/avoid deliriogenic meds including: anticholinergic, opiates, benzodiazepines           - Maintain hydration, oxygenation, nutrition           - Limit use of restraints and catheters           - Normalize sleep patterns by minimizing nighttime noise, light and interruptions by     -Ensure sleep apnea treatment is provided overnight.             clustering care, opening blinds during the day           - Reorient the patient frequently, provide easily visible clock and calendar           - Provide sensory aids like glasses, hearing aids           - Encourage ambulation, regular activities and visitors to maintain cognitive stimulation   -Patient would benefit from having family members at bedside to reinforce his orientation.  Bipolar affective disorder, depressed, severe: -Continued Lamictal 100 mg daily -Continued Cymbalta 60 mg daily  Anxiety and neuropathic pain: -Continued gabapentin 400 mg TID  HTN: -Continued lisinopril 10 mg daily  Disposition: Recommend psychiatric Inpatient admission when medically cleared. Supportive therapy provided about ongoing stressors.  Patrick NorthHimabindu Latesa Fratto, MD 07/11/2019 2:25 PM

## 2019-07-12 ENCOUNTER — Inpatient Hospital Stay: Admission: AD | Admit: 2019-07-12 | Payer: Self-pay | Source: Ambulatory Visit | Admitting: Psychiatry

## 2019-07-12 ENCOUNTER — Inpatient Hospital Stay
Admission: AD | Admit: 2019-07-12 | Discharge: 2019-07-14 | DRG: 885 | Disposition: A | Discharge status: Home / Self Care | Payer: Medicaid Other | Source: Intra-hospital | Attending: Psychiatry | Admitting: Psychiatry

## 2019-07-12 DIAGNOSIS — F419 Anxiety disorder, unspecified: Secondary | ICD-10-CM

## 2019-07-12 DIAGNOSIS — F3164 Bipolar disorder, current episode mixed, severe, with psychotic features: Secondary | ICD-10-CM

## 2019-07-12 DIAGNOSIS — F3130 Bipolar disorder, current episode depressed, mild or moderate severity, unspecified: Secondary | ICD-10-CM | POA: Diagnosis present

## 2019-07-12 DIAGNOSIS — F331 Major depressive disorder, recurrent, moderate: Secondary | ICD-10-CM

## 2019-07-12 DIAGNOSIS — N390 Urinary tract infection, site not specified: Secondary | ICD-10-CM | POA: Diagnosis present

## 2019-07-12 DIAGNOSIS — F319 Bipolar disorder, unspecified: Secondary | ICD-10-CM | POA: Diagnosis not present

## 2019-07-12 DIAGNOSIS — Z6839 Body mass index (BMI) 39.0-39.9, adult: Secondary | ICD-10-CM | POA: Diagnosis not present

## 2019-07-12 DIAGNOSIS — E669 Obesity, unspecified: Secondary | ICD-10-CM | POA: Diagnosis present

## 2019-07-12 DIAGNOSIS — I10 Essential (primary) hypertension: Secondary | ICD-10-CM

## 2019-07-12 DIAGNOSIS — F329 Major depressive disorder, single episode, unspecified: Secondary | ICD-10-CM | POA: Diagnosis present

## 2019-07-12 LAB — BASIC METABOLIC PANEL
Anion gap: 10 (ref 5–15)
BUN: 10 mg/dL (ref 6–20)
CO2: 21 mmol/L — ABNORMAL LOW (ref 22–32)
Calcium: 9.2 mg/dL (ref 8.9–10.3)
Chloride: 113 mmol/L — ABNORMAL HIGH (ref 98–111)
Creatinine, Ser: 0.66 mg/dL (ref 0.44–1.00)
GFR calc Af Amer: >60 mL/min (ref >60)
GFR calc non Af Amer: >60 mL/min (ref >60)
Glucose, Bld: 92 mg/dL (ref 70–99)
Potassium: 3.4 mmol/L — ABNORMAL LOW (ref 3.5–5.1)
Sodium: 144 mmol/L (ref 135–145)

## 2019-07-12 LAB — CBC
HCT: 33.2 % — ABNORMAL LOW (ref 36.0–46.0)
Hemoglobin: 10.4 g/dL — ABNORMAL LOW (ref 12.0–15.0)
MCH: 31 pg (ref 26.0–34.0)
MCHC: 31.3 g/dL (ref 30.0–36.0)
MCV: 99.1 fL (ref 80.0–100.0)
Platelets: 179 10*3/uL (ref 150–400)
RBC: 3.35 MIL/uL — ABNORMAL LOW (ref 3.87–5.11)
RDW: 16.2 % — ABNORMAL HIGH (ref 11.5–15.5)
WBC: 4.7 10*3/uL (ref 4.0–10.5)
nRBC: 0 % (ref 0.0–0.2)

## 2019-07-12 LAB — MAGNESIUM: Magnesium: 1.6 mg/dL — ABNORMAL LOW (ref 1.7–2.4)

## 2019-07-12 MED ORDER — ACETAMINOPHEN 325 MG PO TABS
650.0000 mg | ORAL_TABLET | Freq: Four times a day (QID) | ORAL | Status: DC | PRN
  Administered 2019-07-14: 08:00:00 650 mg via ORAL
  Filled 2019-07-12: qty 2

## 2019-07-12 MED ORDER — POTASSIUM CHLORIDE CRYS ER 20 MEQ PO TBCR
40.0000 meq | EXTENDED_RELEASE_TABLET | Freq: Two times a day (BID) | ORAL | Status: DC
  Administered 2019-07-12 (×2): 40 meq via ORAL
  Filled 2019-07-12 (×2): qty 2

## 2019-07-12 MED ORDER — ALUM & MAG HYDROXIDE-SIMETH 200-200-20 MG/5ML PO SUSP: 30.0000 mL | ORAL | Status: DC | PRN

## 2019-07-12 MED ORDER — MAGNESIUM HYDROXIDE 400 MG/5ML PO SUSP: 30.0000 mL | Freq: Every day | ORAL | Status: DC | PRN

## 2019-07-12 MED ORDER — MAGNESIUM SULFATE 2 GM/50ML IV SOLN: 2.0000 g | Freq: Once | INTRAVENOUS | Status: DC

## 2019-07-12 MED ORDER — CIPROFLOXACIN HCL 500 MG PO TABS
500.0000 mg | ORAL_TABLET | Freq: Two times a day (BID) | ORAL | Status: DC
  Administered 2019-07-12: 17:00:00 500 mg via ORAL
  Filled 2019-07-12 (×2): qty 1

## 2019-07-12 NOTE — TOC Progression Note (Signed)
Transition of Care [facility]) - Progression Note    Patient Details  Name: Debbie Golden MRN: [id number] Date of Birth: [birth year]  Transition of Care [facility]) CM/SW [place], RN Phone Number: 07/12/2019, 4:31 PM  Clinical Narrative:     Therisa Doyne and requested a referral to be reviewed, I spoke to [place] and faxed the information including a H&P, clinical notes from Psych and [facility] as well as Fce sheet to [phone], awaiting a review and a responce       Expected Discharge Plan and Services                                                 Social Determinants of Health (SDOH) Interventions    Readmission Risk Interventions No flowsheet data found.

## 2019-07-12 NOTE — TOC Progression Note (Signed)
Transition of Care [facility]) - Progression Note    Patient Details  Name: Debbie Golden MRN: [id number] Date of Birth: 1971-02-24  Transition of Care [facility]) CM/SW [place], RN Phone Number: 07/12/2019, 3:52 PM  Clinical Narrative:     Hulen Skains out to [facility] and spoke to [place], she will take a look in Epic and see if they have a bed to accept this patient.  She will call me back and let me know       Expected Discharge Plan and Services                                                 Social Determinants of Health (SDOH) Interventions    Readmission Risk Interventions No flowsheet data found.

## 2019-07-12 NOTE — Consult Note (Signed)
[facility] Face-to-Face Psychiatry Consult   Reason for Consult:  Catatonia  Referring Physician:  EDP Patient Identification: Debbie Golden MRN:  [id number] Principal Diagnosis: Bipolar affective disorder, depressed, severe  Diagnosis:  Principal Problem:   Severe sepsis ([place]) Active Problems:   GERD (gastroesophageal reflux disease)   Catatonia   UTI (urinary tract infection)   AKI (acute kidney injury) ([place])  Total Time spent with patient: 20 minutes  Subjective:   Debbie Golden is a 48 y.o. female patient admitted with catatonia.  She was admitted to the medical floor after developing a fever and being treated for sepsis related to a UTI.  Patient at this time is medically stable per hospitalist. Patient is alert and oriented to place  this morning.  She is somewhat oriented to the situation, she thinks she was admitted for her neuropathy.  She is able to speak normally.  States that she feels better but very tired and then closes her eyes. She is not seen responding to any type of internal stimuli. Patient has started to take her medications orally and is able to eat.  Past Psychiatric History: bipolar disorder  Risk to Self:  impaired Risk to Others:  none Prior Inpatient Therapy:  yes Prior Outpatient Therapy:  yes  Past Medical History:  Past Medical History:  Diagnosis Date  . Bipolar disorder ([place])   . Depression   . Genital herpes   . Obesity   . Weakness     Past Surgical History:  Procedure Laterality Date  . ANKLE FRACTURE SURGERY Right   . GASTRIC BYPASS N/A 2007  . ORIF ANKLE FRACTURE Left 04/02/2018   Procedure: OPEN REDUCTION INTERNAL FIXATION (ORIF) ANKLE FRACTURE;  Surgeon: Earnestine Leys, MD;  Location: [facility] ORS;  Service: Orthopedics;  Laterality: Left;   Family History:  Family History  Adopted: Yes   Family Psychiatric  History: none Social History:  Social History   Substance and Sexual Activity  Alcohol Use Yes   Comment: 3 drinks per week      Social History   Substance and Sexual Activity  Drug Use No    Social History   Socioeconomic History  . Marital status: Single    Spouse name: Not on file  . Number of children: 0  . Years of education: 19  . Highest education level: Master's degree (e.g., MA, MS, MEng, MEd, MSW, MBA)  Occupational History  . Occupation: Press photographer  Social Needs  . Financial resource strain: Somewhat hard  . Food insecurity    Worry: Sometimes true    Inability: Never true  . Transportation needs    Medical: No    Non-medical: Yes  Tobacco Use  . Smoking status: Never Smoker  . Smokeless tobacco: Never Used  Substance and Sexual Activity  . Alcohol use: Yes    Comment: 3 drinks per week  . Drug use: No  . Sexual activity: Not on file  Lifestyle  . Physical activity    Days per week: 0 days    Minutes per session: 0 min  . Stress: Very much  Relationships  . Social connections    Talks on phone: More than three times a week    Gets together: More than three times a week    Attends religious service: Never    Active member of club or organization: Yes    Attends meetings of clubs or organizations: Never    Relationship status: Living with partner  Other Topics Concern  . Not  on file  Social History Narrative   Lives at home with her fiance, Alinda Moneyony.   Right-handed.   One cup caffeine per day.   Additional Social History:    Allergies:  No Known Allergies  Labs:  Results for orders placed or performed during the hospital encounter of 07/07/19 (from the past 48 hour(s))  CBC     Status: Abnormal   Collection Time: 07/12/19  5:42 AM  Result Value Ref Range   WBC 4.7 4.0 - 10.5 K/uL   RBC 3.35 (L) 3.87 - 5.11 MIL/uL   Hemoglobin 10.4 (L) 12.0 - 15.0 g/dL   HCT 16.133.2 (L) 09.636.0 - 04.546.0 %   MCV 99.1 80.0 - 100.0 fL   MCH 31.0 26.0 - 34.0 pg   MCHC 31.3 30.0 - 36.0 g/dL   RDW 40.916.2 (H) 81.111.5 - 91.415.5 %   Platelets 179 150 - 400 K/uL   nRBC 0.0 0.0 - 0.2 %    Comment:  Performed at [facility], [address] [place], [place] [address]  Basic metabolic panel     Status: Abnormal   Collection Time: 07/12/19  5:42 AM  Result Value Ref Range   Sodium 144 135 - 145 mmol/L   Potassium 3.4 (L) 3.5 - 5.1 mmol/L   Chloride 113 (H) 98 - 111 mmol/L   CO2 21 (L) 22 - 32 mmol/L   Glucose, Bld 92 70 - 99 mg/dL   BUN 10 6 - 20 mg/dL   Creatinine, Ser 6.210.66 0.44 - 1.00 mg/dL   Calcium 9.2 8.9 - 30.810.3 mg/dL   GFR calc non Af Amer >60 >60 mL/min   GFR calc Af Amer >60 >60 mL/min   Anion gap 10 5 - 15    Comment: Performed at [facility], [address] [place], [place] [address]  Magnesium     Status: Abnormal   Collection Time: 07/12/19  5:42 AM  Result Value Ref Range   Magnesium 1.6 (L) 1.7 - 2.4 mg/dL    Comment: Performed at [facility], [address] [place], [place] [address]    Current Facility-Administered Medications  Medication Dose Route Frequency Provider Last Rate Last Dose  . 0.9 %  sodium chloride infusion   Intravenous Continuous Altamese DillingVachhani, Vaibhavkumar, MD   Stopped at 07/11/19 [id number]  . acetaminophen (TYLENOL) tablet 650 mg  650 mg Oral Q6H PRN Oralia ManisWillis, David, MD      . cefTRIAXone (ROCEPHIN) 1 g in sodium chloride 0.9 % 100 mL IVPB  1 g Intravenous Q24H Oralia ManisWillis, David, MD 200 mL/hr at 07/10/19 2335 1 g at 07/10/19 2335  . DULoxetine (CYMBALTA) DR capsule 60 mg  60 mg Oral Daily Oralia ManisWillis, David, MD   60 mg at 07/12/19 0848  . enoxaparin (LOVENOX) injection 40 mg  40 mg Subcutaneous Q12H Oralia ManisWillis, David, MD   40 mg at 07/12/19 0849  . folic acid injection 1 mg  1 mg Intravenous Daily Oralia ManisWillis, David, MD   1 mg at 07/11/19 0956  . gabapentin (NEURONTIN) capsule 400 mg  400 mg Oral TID Altamese DillingVachhani, Vaibhavkumar, MD   400 mg at 07/12/19 0848  . ketorolac (TORADOL) 30 MG/ML injection 30 mg  30 mg Intravenous Q6H PRN [place] SirenSainani, Vivek J, MD   30 mg at 07/09/19 2025  . labetalol (NORMODYNE) injection 10 mg  10 mg Intravenous Q2H PRN  Oralia ManisWillis, David, MD   10 mg at 07/09/19 2334  . lamoTRIgine (LAMICTAL) tablet 100 mg  100 mg Oral  Daily Oralia ManisWillis, David, MD   100 mg at 07/12/19 0848  . LORazepam (ATIVAN) injection 1 mg  1 mg Intramuscular Q4H PRN Altamese DillingVachhani, Vaibhavkumar, MD      . metoprolol tartrate (LOPRESSOR) tablet 25 mg  25 mg Oral BID Altamese DillingVachhani, Vaibhavkumar, MD   25 mg at 07/12/19 0848  . OLANZapine (ZYPREXA) injection 5 mg  5 mg Intramuscular BID Charm RingsLord, Jamison Y, NP   5 mg at 07/11/19 0813   Or  . OLANZapine (ZYPREXA) tablet 5 mg  5 mg Oral BID Charm RingsLord, Jamison Y, NP   5 mg at 07/12/19 0847  . ondansetron (ZOFRAN) tablet 4 mg  4 mg Oral Q6H PRN Oralia ManisWillis, David, MD       Or  . ondansetron [facility](ZOFRAN) injection 4 mg  4 mg Intravenous Q6H PRN Oralia ManisWillis, David, MD      . potassium chloride SA (K-DUR) CR tablet 40 mEq  40 mEq Oral BID Altamese DillingVachhani, Vaibhavkumar, MD   40 mEq at 07/12/19 0848    Musculoskeletal: Strength & Muscle Tone: decreased Gait & Station: did not walk Patient leans: N/A  Psychiatric Specialty Exam: Physical Exam  Nursing note and vitals reviewed. Constitutional: She appears well-developed and well-nourished.  HENT:  Head: Normocephalic.  Respiratory: Effort normal.  Neurological: She is alert.  Psychiatric: Her affect is blunt. She is slowed. Cognition and memory are impaired. She expresses inappropriate judgment. She is noncommunicative.    Review of Systems  All other systems reviewed and are negative.   Blood pressure 122/72, pulse 81, temperature 98.1 F (36.7 C), resp. rate 18, height 5\' 8"  (1.727 m), weight 119.1 kg, SpO2 99 %.Body mass index is 39.91 kg/m.  General Appearance: Casual  Eye Contact:  Fair  Speech: Minimal speech  Volume:  Soft and slow  Mood:  depressed  Affect: Blunted  Thought Process:  NA  Orientation:  NA  Thought Content:  NA  Suicidal Thoughts:  denies  Homicidal Thoughts:  denies  Memory:  unable to assess  Judgement:  Impaired  Insight:  unable to assess  Psychomotor  Activity:  Decreased  Concentration: poor  Recall:  poor  Fund of Knowledge:  unable to assess  Language:  Negative  Akathisia:  Negative  Handed:  Unable to assess  AIMS (if indicated):     Assets:  Housing Intimacy Leisure Time Resilience Social Support  ADL's:  Impaired  Cognition:  improving  Sleep:      Treatment Plan Summary: Daily contact with patient to assess and evaluate symptoms and progress in treatment, Medication management and Plan catatonia related to mental issue:  Delirium Resolved   Catatonia Improving  Bipolar affective disorder, depressed, severe: -Continued Lamictal 100 mg daily -Continued Cymbalta 60 mg daily - Zyprexa at 5mg  po bid  Anxiety and neuropathic pain: -Continued gabapentin 400 mg TID  HTN: -Continued lisinopril 10 mg daily  Disposition: Recommend psychiatric Inpatient admission when medically cleared. Supportive therapy provided about ongoing stressors.   A bed is unavailable on the psychiatric unit at this time.  Patrick NorthHimabindu Laiylah Roettger, MD 07/12/2019 1:31 PM

## 2019-07-12 NOTE — Progress Notes (Signed)
Patient has been alert and oriented x 4, delayed responses. Calm and cooperative. Took scheduled medications. Slept through the night.

## 2019-07-12 NOTE — Progress Notes (Signed)
[facility] - [place] [facility]   PATIENT NAME: Debbie Golden    MR#:  [id number]  DATE OF BIRTH:  04-Jun-1971  SUBJECTIVE:  CHIEF COMPLAINT:   Chief Complaint  Patient presents with  . Psychiatric Evaluation   Patient admitted to medical service, have catatonia- managed by psychiatry services. The patient was agitated and completely hallucinating. She has gradual improvement now today she is more awake and talking and denies any complaints.  REVIEW OF SYSTEMS:    Review of Systems  Constitutional: Negative for fever, malaise/fatigue and weight loss.  HENT: Negative for congestion, ear discharge, hearing loss and sore throat.   Eyes: Negative for blurred vision, double vision and discharge.  Respiratory: Negative for cough, hemoptysis, sputum production and shortness of breath.   Cardiovascular: Negative for chest pain, palpitations and orthopnea.  Gastrointestinal: Negative for abdominal pain, constipation, nausea and vomiting.  Genitourinary: Negative for dysuria, frequency and urgency.  Musculoskeletal: Negative for joint pain and neck pain.  Skin: Negative for rash.  Neurological: Negative for tingling, tremors, speech change and weakness.  Psychiatric/Behavioral: Negative for suicidal ideas.    DRUG ALLERGIES:  No Known Allergies  VITALS:  Blood pressure 122/72, pulse 81, temperature 98.1 F (36.7 C), resp. rate 18, height 5\' 8"  (1.727 m), weight 119.1 kg, SpO2 99 %.  PHYSICAL EXAMINATION:   Constitutional: She appears well-developed and well-nourished. No distress.  HENT:  Head: Normocephalic and atraumatic.  Mouth/Throat: Oropharynx is clear and moist.  Eyes: Pupils are equal, round, and reactive to light. Conjunctivae and EOM are normal. No scleral icterus.  Neck: Normal range of motion. Neck supple. No JVD present. No thyromegaly present.  Cardiovascular: Normal rate, regular rhythm and intact distal pulses. Exam reveals no gallop and no  friction rub.  No murmur heard. Respiratory: Effort normal and breath sounds normal. No respiratory distress. She has no wheezes. She has no rales.  GI: Soft. Bowel sounds are normal. She exhibits no distension. There is no abdominal tenderness.  Musculoskeletal: Normal range of motion.        General: No edema.     Comments: No arthritis, no gout  Lymphadenopathy:    She has no cervical adenopathy.  Neurological:   Moving all 4 limbs but appears calm today Skin: Skin is warm and dry. No rash noted. No erythema.  Psychiatric:  Unable to assess due to patient condition   Physical Exam LABORATORY PANEL:   CBC Recent Labs  Lab 07/12/19 0542  WBC 4.7  HGB 10.4*  HCT 33.2*  PLT 179   ------------------------------------------------------------------------------------------------------------------  Chemistries  Recent Labs  Lab 07/07/19 0840  07/12/19 0542  NA 138   < > 144  K 4.4   < > 3.4*  CL 103   < > 113*  CO2 24   < > 21*  GLUCOSE 133*   < > 92  BUN 18   < > 10  CREATININE 1.32*   < > 0.66  CALCIUM 9.7   < > 9.2  MG  --   --  1.6*  AST 30  --   --   ALT 20  --   --   ALKPHOS 159*  --   --   BILITOT 1.0  --   --    < > = values in this interval not displayed.   ------------------------------------------------------------------------------------------------------------------  Cardiac Enzymes No results for input(s): TROPONINI in the last 168 hours. ------------------------------------------------------------------------------------------------------------------  RADIOLOGY:  No results found.  ASSESSMENT AND PLAN:  Principal Problem:   Severe sepsis ([place]) Active Problems:   GERD (gastroesophageal reflux disease)   Catatonia   UTI (urinary tract infection)   AKI (acute kidney injury) (HCC)   Severe sepsis (HCC) -lactic acid within normal limits, blood pressure stable, IV antibiotics given, cultures sent ,  Urine culture report reviewed with E.  Coli. Change to oral Abx now as she is taking oral.   * Catatonia   -psychiatry following, defer to their recommendations for treatment of this problem -started on Zyprexa IM. Now patient improved and started taking oral medications and food, she is stable to be transferred to psychiatric floor but as per psychiatrist there is no beds available on the floor so social worker is looking for transfer to other psychiatric facilities.    UTI (urinary tract infection) -IV antibiotics and culture as above    AKI (acute kidney injury) ([place]) -IV fluids tonight, avoid nephrotoxins and monitor    Kidney function improved now.    All the records are reviewed and case discussed with Care Management/Social Workerr. Management plans discussed with the patient, family and they are in agreement.  CODE STATUS: full.  TOTAL TIME TAKING CARE OF THIS PATIENT: 35 minutes.     POSSIBLE D/C IN 1-2 DAYS, DEPENDING ON CLINICAL CONDITION.   Vaughan Basta M.D on 07/12/2019   Between 7am to 6pm - Pager - [phone]  After 6pm go to [email or web address] - password [email or web address] [facility]  Office  [phone]  CC: Primary care physician; Hubbard Hartshorn, FNP  Note: This dictation was prepared with Dragon dictation along with smaller phrase technology. Any transcriptional errors that result from this process are unintentional.

## 2019-07-12 NOTE — BH Assessment (Addendum)
Patient has been accepted to [facility].  Accepting physician is Dr. Einar Grad.  Attending Physician will be Dr. Weber Cooks.  Patient has been assigned to room 309, by [place] Charge Nurse Demetria.  Call report to [phone].  Representative/Transfer Coordinator is Print production planner [facility] TTS) Patient pre-admitted by [facility] Patient Access (pt needs admission orders)   Unit 1A Staff made aware of acceptance - [phone].

## 2019-07-13 ENCOUNTER — Other Ambulatory Visit: Payer: Self-pay

## 2019-07-13 LAB — CULTURE, BLOOD (ROUTINE X 2)
Culture: NO GROWTH
Culture: NO GROWTH
Special Requests: ADEQUATE

## 2019-07-13 LAB — HIV ANTIBODY (ROUTINE TESTING W REFLEX): HIV Screen 4th Generation wRfx: NONREACTIVE

## 2019-07-13 MED ORDER — DULOXETINE HCL 60 MG PO CPEP: 60.0000 mg | ORAL_CAPSULE | Freq: Every day | ORAL | 0 refills | Status: DC

## 2019-07-13 MED ORDER — LAMOTRIGINE 100 MG PO TABS: 100.0000 mg | ORAL_TABLET | Freq: Every day | ORAL | 0 refills | Status: DC

## 2019-07-13 MED ORDER — LISINOPRIL 10 MG PO TABS: 10.0000 mg | ORAL_TABLET | Freq: Every day | ORAL | 0 refills | Status: DC

## 2019-07-13 MED ORDER — RISPERIDONE 2 MG PO TABS: 2.0000 mg | ORAL_TABLET | Freq: Every day | ORAL | 0 refills | Status: DC

## 2019-07-13 MED ORDER — LISINOPRIL 20 MG PO TABS
10.0000 mg | ORAL_TABLET | Freq: Every day | ORAL | Status: DC
  Administered 2019-07-14: 08:00:00 10 mg via ORAL
  Filled 2019-07-13 (×2): qty 1

## 2019-07-13 MED ORDER — DULOXETINE HCL 30 MG PO CPEP
60.0000 mg | ORAL_CAPSULE | Freq: Every day | ORAL | Status: DC
  Administered 2019-07-13 – 2019-07-14 (×2): 60 mg via ORAL
  Filled 2019-07-13 (×2): qty 2

## 2019-07-13 MED ORDER — VITAMIN B-12 1000 MCG PO TABS: 1000.0000 ug | ORAL_TABLET | Freq: Every day | ORAL | 0 refills | Status: DC

## 2019-07-13 MED ORDER — HYDROXYZINE HCL 25 MG PO TABS
25.0000 mg | ORAL_TABLET | Freq: Three times a day (TID) | ORAL | Status: DC | PRN
  Administered 2019-07-14: 25 mg via ORAL
  Filled 2019-07-13: qty 1

## 2019-07-13 MED ORDER — GABAPENTIN 600 MG PO TABS: 600.0000 mg | ORAL_TABLET | Freq: Three times a day (TID) | ORAL | 0 refills | Status: DC

## 2019-07-13 MED ORDER — GABAPENTIN 400 MG PO CAPS
400.0000 mg | ORAL_CAPSULE | Freq: Three times a day (TID) | ORAL | Status: DC
  Administered 2019-07-13 – 2019-07-14 (×4): 400 mg via ORAL
  Filled 2019-07-13 (×4): qty 1

## 2019-07-13 MED ORDER — LAMOTRIGINE 100 MG PO TABS
100.0000 mg | ORAL_TABLET | Freq: Every day | ORAL | Status: DC
  Administered 2019-07-13 – 2019-07-14 (×2): 100 mg via ORAL
  Filled 2019-07-13 (×2): qty 1

## 2019-07-13 MED ORDER — RISPERIDONE 1 MG PO TABS
2.0000 mg | ORAL_TABLET | Freq: Every day | ORAL | Status: DC
  Administered 2019-07-13: 2 mg via ORAL
  Filled 2019-07-13: qty 2

## 2019-07-13 NOTE — Plan of Care (Signed)
Patient newly admitted, hasn't had time to progress  Problem: Education: Goal: Knowledge of [facility] Education information/materials will improve Outcome: Not Progressing Goal: Emotional status will improve Outcome: Not Progressing Goal: Mental status will improve Outcome: Not Progressing Goal: Verbalization of understanding the information provided will improve Outcome: Not Progressing   Problem: Safety: Goal: Periods of time without injury will increase Outcome: Not Progressing   Problem: Education: Goal: Utilization of techniques to improve thought processes will improve Outcome: Not Progressing Goal: Knowledge of the prescribed therapeutic regimen will improve Outcome: Not Progressing   Problem: Safety: Goal: Ability to disclose and discuss suicidal ideas will improve Outcome: Not Progressing   
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
## 2019-07-13 NOTE — Progress Notes (Addendum)
Admission Note:  48 yr female who presents Voluntary commitment in no acute distress for the treatment of Bipolar disorder, Catatonia, Depression.and Sepsis from UTI infection. Pt appears flat and depressed, she was brought in a wheel chair and she needed assistance to ambulate to the bathroom for assessment. Patient endorsed generalized weakness to all extremities.  Patient's gait is unsteady, her thoughts are confused, some confusion noted to place and situation' she was confused several times about the hour of the day and she was frequently reoriented as needed. Patient was calm and cooperative with admission process, she denies SI/HI/AVH  and contracts for safety upon admission. Patient has Past medical Hx of Bipolar Disease, Depression, GERD, UTI and Acute Kdney Disease. Patient was tearful sometimes during the interview phase, she expressed how she was septic from UTI and how she has been experiencing worsening depression. Skin was assessed in presence of Valeta Harms and found to be clear of any abnormal marks also she was searched and no contraband found, POC and unit policies explained,  understanding verbalized and consents obtained. Food and fluids offered, and fluids was  accepted. 15 minutes safety checks maintained will continue to monitor.
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
## 2019-07-13 NOTE — B[facility] Suicide Risk Assessment (Signed)
[place] INPATIENT:  Family/Significant Other Suicide Prevention Education  Suicide Prevention Education:  Education Completed; Evert Kohl, fiance [phone] has been identified by the patient as the family member/significant other with whom the patient will be residing, and identified as the person(s) who will aid the patient in the event of a mental health crisis (suicidal ideations/suicide attempt).  With written consent from the patient, the family member/significant other has been provided the following suicide prevention education, prior to the and/or following the discharge of the patient.  The suicide prevention education provided includes the following:  Suicide risk factors  Suicide prevention and interventions  National Suicide Hotline telephone number  [facility] assessment telephone number  [facility] Emergency Assistance [place] and/or Residential Mobile Crisis Unit telephone number  Request made of family/significant other to:  Remove weapons (e.g., guns, rifles, knives), all items previously/currently identified as safety concern.    Remove drugs/medications (over-the-counter, prescriptions, illicit drugs), all items previously/currently identified as a safety concern.  The family member/significant other verbalizes understanding of the suicide prevention education information provided.  The family member/significant other agrees to remove the items of safety concern listed above.   CSW spoke with pts fiance, Nicole Kindred. Nicole Kindred reported that pt is in the hospital now due to being off her medications for a few days. Nicole Kindred reported that pt was in the hospital in [place] until March and they recently started going to [facility] for pts follow up care to continue her medications. Nicole Kindred denied any concerns with SI/HI and denied any guns/weapons being in the home. Nicole Kindred reported that he has no concern with pt returning home at discharge and reported he  would be the one to pick her up when she is discharged and reported that he is off work until Monday. Nicole Kindred requested to speak with the doctor regarding pts medications as he assist pt with her medications and CSW relayed that information to Dr. Weber Cooks.   [place] MSW LCSW 07/13/2019, 9:36 AM

## 2019-07-13 NOTE — B[facility] Group Notes (Addendum)
LCSW Group Therapy Note  07/13/2019 11:48 AM  Type of Therapy/Topic:  Group Therapy:  Emotion Regulation  Participation Level:  Did Not Attend   Description of Group:   The purpose of this group is to assist patients in learning to regulate negative emotions and experience positive emotions. Patients will be guided to discuss ways in which they have been vulnerable to their negative emotions. These vulnerabilities will be juxtaposed with experiences of positive emotions or situations, and patients will be challenged to use positive emotions to combat negative ones. Special emphasis will be placed on coping with negative emotions in conflict situations, and patients will process healthy conflict resolution skills.  Therapeutic Goals: 1. Patient will identify two positive emotions or experiences to reflect on in order to balance out negative emotions 2. Patient will label two or more emotions that they find the most difficult to experience 3. Patient will demonstrate positive conflict resolution skills through discussion and/or role plays  Summary of Patient Progress: x   Therapeutic Modalities:   Cognitive Behavioral Therapy Feelings Identification Dialectical Behavioral Therapy   Laquonda Welby, MSW, LCSW Clinical Social Work 07/13/2019 11:48 AM   

## 2019-07-13 NOTE — Plan of Care (Signed)
Patient is monitored for safety and assisted with ADLs , patient is confined to a wheel chair , bedside commode is placed by the bedside for easy assess. Medication is provided and tolerated well, no noticeable side effect, patient expressed no concerns at this time , denies any SI/HI/AVH, states poor appetite with food, support and encouragement  is provided , safety is provided and monitored every 15 minutes for safety no distress.   Problem: Education: Goal: Knowledge of [place] [facility] Education information/materials will improve Outcome: Progressing Goal: Emotional status will improve Outcome: Progressing Goal: Mental status will improve Outcome: Progressing Goal: Verbalization of understanding the information provided will improve Outcome: Progressing   Problem: Safety: Goal: Periods of time without injury will increase Outcome: Progressing   Problem: Education: Goal: Utilization of techniques to improve thought processes will improve Outcome: Progressing Goal: Knowledge of the prescribed therapeutic regimen will improve Outcome: Progressing   Problem: Safety: Goal: Ability to disclose and discuss suicidal ideas will improve Outcome: Progressing

## 2019-07-13 NOTE — Tx Team (Signed)
Initial Treatment Plan 07/13/2019 3:10 AM Debbie Golden SXJ:[id number]    PATIENT STRESSORS: Health problems Medication change or noncompliance   PATIENT STRENGTHS: Motivation for treatment/growth Supportive family/friends   PATIENT IDENTIFIED PROBLEMS: Anxiety  Depression  Acute Psychosis                  DISCHARGE CRITERIA:  Motivation to continue treatment in a less acute level of care Verbal commitment to aftercare and medication compliance  PRELIMINARY DISCHARGE PLAN: Outpatient therapy Return to previous living arrangement  PATIENT/FAMILY INVOLVEMENT: This treatment plan has been presented to and reviewed with the patient, Debbie Golden. The patient hads been given the opportunity to ask questions and make suggestions.  Mallie Darting, RN 07/13/2019, 3:10 AM

## 2019-07-13 NOTE — Progress Notes (Signed)
Pt has been sleeping all day except during mealtime where she eats in the day room. She has to be assisted in and out of wheelchair and bedside commode. Collier Bullock RN

## 2019-07-13 NOTE — Plan of Care (Signed)
Pt states she does not know why she is here. Pt was educated on care plan and safety to prevent falls.Pt verbalizes understanding. Collier Bullock RN Problem: Education: Goal: Freight forwarder Education information/materials will improve Outcome: Progressing Goal: Emotional status will improve Outcome: Not Progressing Goal: Mental status will improve Outcome: Not Progressing Goal: Verbalization of understanding the information provided will improve Outcome: Progressing   Problem: Safety: Goal: Periods of time without injury will increase Outcome: Progressing   Problem: Education: Goal: Utilization of techniques to improve thought processes will improve Outcome: Not Progressing Goal: Knowledge of the prescribed therapeutic regimen will improve Outcome: Progressing   Problem: Safety: Goal: Ability to disclose and discuss suicidal ideas will improve Outcome: Progressing

## 2019-07-13 NOTE — Progress Notes (Signed)
Recreation Therapy Notes   Date: 07/13/2019  Time: 9:30 am   Location: Craft room   Behavioral response: N/A   Intervention Topic: Stress  Discussion/Intervention: Patient did not attend group.   Clinical Observations/Feedback:  Patient did not attend group.   Davidson Palmieri LRT/CTRS        Debbie Golden 07/13/2019 12:08 PM 

## 2019-07-13 NOTE — [facility] Suicide Risk Assessment (Signed)
[facility] Admission Suicide Risk Assessment   Nursing information obtained from:  Patient Demographic factors:  NA Current Mental Status:  NA Loss Factors:  NA Historical Factors:  NA Risk Reduction Factors:  NA  Total Time spent with patient: 45 minutes Principal Problem: Bipolar disorder (manic depression) ([place]) Diagnosis:  Principal Problem:   Bipolar disorder (manic depression) ([place]) Active Problems:   Major depression   MDD (major depressive disorder)  Subjective Data: Patient seen and chart reviewed.  Patient came to the hospital with confusion and possible catatonia.  Recent anxiety.  No evidence of self injury.  No statements at any point of suicidal ideation.  Patient denies any suicidal thoughts at all currently.  No evidence of current psychosis.  Cognitively improved  Continued Clinical Symptoms:  Alcohol Use Disorder Identification Test Final Score (AUDIT): 0 The "Alcohol Use Disorders Identification Test", Guidelines for Use in Primary Care, Second Edition.  World Pharmacologist [facility]). Score between 0-7:  no or low risk or alcohol related problems. Score between 8-15:  moderate risk of alcohol related problems. Score between 16-19:  high risk of alcohol related problems. Score 20 or above:  warrants further diagnostic evaluation for alcohol dependence and treatment.   CLINICAL FACTORS:   Bipolar Disorder:   Mixed State   Musculoskeletal: Strength & Muscle Tone: within normal limits Gait & Station: unable to stand Patient leans: N/A  Psychiatric Specialty Exam: Physical Exam  Nursing note and vitals reviewed. Constitutional: She appears well-developed and well-nourished.  HENT:  Head: Normocephalic and atraumatic.  Eyes: Pupils are equal, round, and reactive to light. Conjunctivae are normal.  Neck: Normal range of motion.  Cardiovascular: Regular rhythm and normal heart sounds.  Respiratory: Effort normal. No respiratory distress.  GI: Soft.   Musculoskeletal: Normal range of motion.  Neurological: She is alert.  Skin: Skin is warm and dry.  Psychiatric: Her affect is blunt. Her speech is delayed. She is slowed. Thought content is not paranoid. She expresses impulsivity. She expresses no homicidal and no suicidal ideation. She exhibits abnormal recent memory.    Review of Systems  Constitutional: Negative.   HENT: Negative.   Eyes: Negative.   Respiratory: Negative.   Cardiovascular: Negative.   Gastrointestinal: Negative.   Musculoskeletal: Negative.   Skin: Negative.   Neurological: Negative.   Psychiatric/Behavioral: Positive for depression. Negative for hallucinations, memory loss, substance abuse and suicidal ideas. The patient is not nervous/anxious and does not have insomnia.     Blood pressure 96/68, pulse 80, temperature 98.4 F (36.9 C), temperature source Oral, resp. rate 16, height 5\' 8"  (1.727 m), weight 119 kg, SpO2 98 %.Body mass index is 39.89 kg/m.  General Appearance: Casual  Eye Contact:  Fair  Speech:  Slow  Volume:  Decreased  Mood:  Euthymic  Affect:  Congruent  Thought Process:  Goal Directed  Orientation:  Full (Time, Place, and Person)  Thought Content:  Logical  Suicidal Thoughts:  No  Homicidal Thoughts:  No  Memory:  Immediate;   Fair Recent;   Fair Remote;   Fair  Judgement:  Fair  Insight:  Fair  Psychomotor Activity:  Decreased  Concentration:  Concentration: Fair  Recall:  [employer] of Knowledge:  Fair  Language:  Fair  Akathisia:  No  Handed:  Right  AIMS (if indicated):     Assets:  Desire for Improvement Housing Social Support  ADL's:  Impaired  Cognition:  WNL  Sleep:  Number of Hours: 6  COGNITIVE FEATURES THAT CONTRIBUTE TO RISK:  Loss of executive function    SUICIDE RISK:   Minimal: No identifiable suicidal ideation.  Patients presenting with no risk factors but with morbid ruminations; may be classified as minimal risk based on the severity of the  depressive symptoms  PLAN OF CARE: Patient admitted to psychiatric unit.  15-minute checks employed.  Reviewed treatment in the past.  Spoke to the patient and also to her long-term companion.  Patient currently not expressing any suicidal ideation does not appear to be at elevated risk of self-harm.  Likely discharge tomorrow with outpatient in the community.  I certify that inpatient services furnished can reasonably be expected to improve the patient's condition.   Mordecai RasmussenJohn Fady Stamps, MD 07/13/2019, 3:39 PM

## 2019-07-13 NOTE — Tx Team (Signed)
Interdisciplinary Treatment and Diagnostic Plan Update  07/13/2019 Time of Session: 230p Debbie PatienceHolly Einspahr MRN: [id number]  Principal Diagnosis: Bipolar disorder (manic depression) (HCC)  Secondary Diagnoses: Principal Problem:   Bipolar disorder (manic depression) (HCC) Active Problems:   Major depression   MDD (major depressive disorder)   Current Medications:  Current Facility-Administered Medications  Medication Dose Route Frequency Provider Last Rate Last Dose  . acetaminophen (TYLENOL) tablet 650 mg  650 mg Oral Q6H PRN Catalina Gravelhomspon, Jacqueline, NP      . alum & mag hydroxide-simeth (MAALOX/MYLANTA) 200-200-20 MG/5ML suspension 30 mL  30 mL Oral Q4H PRN Catalina Gravelhomspon, Jacqueline, NP      . DULoxetine (CYMBALTA) DR capsule 60 mg  60 mg Oral Daily Money, Gerlene Burdockravis B, FNP   60 mg at 07/13/19 1202  . gabapentin (NEURONTIN) capsule 400 mg  400 mg Oral TID Money, Gerlene Burdockravis B, FNP   400 mg at 07/13/19 1200  . hydrOXYzine (ATARAX/VISTARIL) tablet 25 mg  25 mg Oral TID PRN Money, Gerlene Burdockravis B, FNP      . lamoTRIgine (LAMICTAL) tablet 100 mg  100 mg Oral Daily Money, Travis B, FNP   100 mg at 07/13/19 1200  . lisinopril (ZESTRIL) tablet 10 mg  10 mg Oral Daily Money, Feliz Beamravis B, FNP      . magnesium hydroxide (MILK OF MAGNESIA) suspension 30 mL  30 mL Oral Daily PRN Catalina Gravelhomspon, Jacqueline, NP      . risperiDONE (RISPERDAL) tablet 2 mg  2 mg Oral QHS Money, Gerlene Burdockravis B, FNP       PTA Medications: Medications Prior to Admission  Medication Sig Dispense Refill Last Dose  . acetaminophen (TYLENOL) 325 MG tablet Take 2 tablets (650 mg total) by mouth every 6 (six) hours as needed for mild pain (or Fever >/= 101).     . Cholecalciferol (VITAMIN D3 PO) Take 4,000 Units by mouth daily.     . DULoxetine (CYMBALTA) 60 MG capsule Take 1 capsule (60 mg total) by mouth daily. 90 capsule 1   . gabapentin (NEURONTIN) 600 MG tablet Take 600 mg by mouth 3 (three) times daily.     . hydrOXYzine (ATARAX/VISTARIL) 25 MG tablet Take 1  tablet (25 mg total) by mouth 3 (three) times daily as needed. 30 tablet 1   . lamoTRIgine (LAMICTAL) 100 MG tablet Take 1 tablet (100 mg total) by mouth daily. 90 tablet 0   . lisinopril (ZESTRIL) 10 MG tablet Take 1 tablet (10 mg total) by mouth daily. 90 tablet 1   . pyridOXINE (VITAMIN B-6) 100 MG tablet Take 1 tablet (100 mg total) by mouth daily. 30 tablet 1   . risperiDONE (RISPERDAL) 2 MG tablet Take 1 tablet (2 mg total) by mouth at bedtime. 30 tablet 1   . vitamin B-12 (CYANOCOBALAMIN) 1000 MCG tablet Take 1,000 mcg by mouth daily.       Patient Stressors: Health problems Medication change or noncompliance  Patient Strengths: Motivation for treatment/growth Supportive family/friends  Treatment Modalities: Medication Management, Group therapy, Case management,  1 to 1 session with clinician, Psychoeducation, Recreational therapy.   Physician Treatment Plan for Primary Diagnosis: Bipolar disorder (manic depression) (HCC) Long Term Goal(s): Improvement in symptoms so as ready for discharge Improvement in symptoms so as ready for discharge   Short Term Goals: Ability to identify changes in lifestyle to reduce recurrence of condition will improve Ability to verbalize feelings will improve Ability to demonstrate self-control will improve Ability to identify and develop effective coping behaviors will improve  Ability to maintain clinical measurements within normal limits will improve Compliance with prescribed medications will improve Ability to identify changes in lifestyle to reduce recurrence of condition will improve Ability to demonstrate self-control will improve Ability to identify and develop effective coping behaviors will improve Ability to maintain clinical measurements within normal limits will improve Compliance with prescribed medications will improve Ability to identify triggers associated with substance abuse/mental health issues will improve  Medication  Management: Evaluate patient's response, side effects, and tolerance of medication regimen.  Therapeutic Interventions: 1 to 1 sessions, Unit Group sessions and Medication administration.  Evaluation of Outcomes: Progressing  Physician Treatment Plan for Secondary Diagnosis: Principal Problem:   Bipolar disorder (manic depression) ([place]) Active Problems:   Major depression   MDD (major depressive disorder)  Long Term Goal(s): Improvement in symptoms so as ready for discharge Improvement in symptoms so as ready for discharge   Short Term Goals: Ability to identify changes in lifestyle to reduce recurrence of condition will improve Ability to verbalize feelings will improve Ability to demonstrate self-control will improve Ability to identify and develop effective coping behaviors will improve Ability to maintain clinical measurements within normal limits will improve Compliance with prescribed medications will improve Ability to identify changes in lifestyle to reduce recurrence of condition will improve Ability to demonstrate self-control will improve Ability to identify and develop effective coping behaviors will improve Ability to maintain clinical measurements within normal limits will improve Compliance with prescribed medications will improve Ability to identify triggers associated with substance abuse/mental health issues will improve     Medication Management: Evaluate patient's response, side effects, and tolerance of medication regimen.  Therapeutic Interventions: 1 to 1 sessions, Unit Group sessions and Medication administration.  Evaluation of Outcomes: Progressing   RN Treatment Plan for Primary Diagnosis: Bipolar disorder (manic depression) ([place]) Long Term Goal(s): Knowledge of disease and therapeutic regimen to maintain health will improve  Short Term Goals: Ability to disclose and discuss suicidal ideas, Ability to identify and develop effective coping behaviors  will improve and Compliance with prescribed medications will improve  Medication Management: RN will administer medications as ordered by provider, will assess and evaluate patient's response and provide education to patient for prescribed medication. RN will report any adverse and/or side effects to prescribing provider.  Therapeutic Interventions: 1 on 1 counseling sessions, Psychoeducation, Medication administration, Evaluate responses to treatment, Monitor vital signs and CBGs as ordered, Perform/monitor CIWA, COWS, AIMS and Fall Risk screenings as ordered, Perform wound care treatments as ordered.  Evaluation of Outcomes: Progressing   LCSW Treatment Plan for Primary Diagnosis: Bipolar disorder (manic depression) ([place]) Long Term Goal(s): Safe transition to appropriate next level of care at discharge, Engage patient in therapeutic group addressing interpersonal concerns.  Short Term Goals: Engage patient in aftercare planning with referrals and resources  Therapeutic Interventions: Assess for all discharge needs, 1 to 1 time with Social worker, Explore available resources and support systems, Assess for adequacy in community support network, Educate family and significant other(s) on suicide prevention, Complete Psychosocial Assessment, Interpersonal group therapy.  Evaluation of Outcomes: Progressing   Progress in Treatment: Attending groups: No. Participating in groups: No. Taking medication as prescribed: Yes. Toleration medication: Yes. Family/Significant other contact made: Yes, individual(s) contacted:  Evert Kohl, significant other Patient understands diagnosis: Yes. Discussing patient identified problems/goals with staff: Yes. Medical problems stabilized or resolved: Yes. Denies suicidal/homicidal ideation: Yes. Issues/concerns per patient self-inventory: No. Other: NA  New problem(s) identified: No, Describe:  none reported  New  Short Term/Long Term Goal(s): Attend  outpatient treatment, take medication as prescribed and develop and implement healthy coping methods  Patient Goals:  "Work with physical therapy to be able to walk"  Discharge Plan or Barriers: Pt will return home and [facility] [place]  Reason for Continuation of Hospitalization: Pt scheduled to discharge on 07/14/19  Estimated Length of Stay: D/C on 07/14/19  Attendees: Patient: Debbie Golden 07/13/2019 2:57 PM  Physician: Mordecai RasmussenJohn Clapacs 07/13/2019 2:57 PM  Nursing:  07/13/2019 2:57 PM  RN Care Manager: 07/13/2019 2:57 PM  Social Worker: Shyanne Mcclary Leatha GildingLivingston Penni HomansMichaela Stanfield PelhamOlivia Moton 07/13/2019 2:57 PM  Recreational Therapist: Danella DeisShay Outlaw 07/13/2019 2:57 PM  Other:  07/13/2019 2:57 PM  Other:  07/13/2019 2:57 PM  Other: 07/13/2019 2:57 PM    Scribe for Treatment Team: Suzan SlickARREN T Lin Hackmann, LCSW 07/13/2019 2:57 PM

## 2019-07-14 DIAGNOSIS — F3164 Bipolar disorder, current episode mixed, severe, with psychotic features: Secondary | ICD-10-CM

## 2019-07-14 MED ORDER — RISPERIDONE 2 MG PO TABS: 2.0000 mg | ORAL_TABLET | Freq: Every day | ORAL | 1 refills | Status: AC

## 2019-07-14 MED ORDER — LISINOPRIL 10 MG PO TABS: 10.0000 mg | ORAL_TABLET | Freq: Every day | ORAL | 1 refills | Status: AC

## 2019-07-14 MED ORDER — DULOXETINE HCL 60 MG PO CPEP: 60.0000 mg | ORAL_CAPSULE | Freq: Every day | ORAL | 1 refills | Status: AC

## 2019-07-14 MED ORDER — GABAPENTIN 600 MG PO TABS: 600.0000 mg | ORAL_TABLET | Freq: Three times a day (TID) | ORAL | 1 refills | Status: AC

## 2019-07-14 MED ORDER — LAMOTRIGINE 100 MG PO TABS: 100.0000 mg | ORAL_TABLET | Freq: Every day | ORAL | 1 refills | Status: AC

## 2019-07-14 MED ORDER — VITAMIN B-12 1000 MCG PO TABS: 1000.0000 ug | ORAL_TABLET | Freq: Every day | ORAL | 1 refills | Status: AC

## 2019-07-14 NOTE — Progress Notes (Signed)
  [facility] Adult Case Management Discharge Plan :  Will you be returning to the same living situation after discharge:  Yes,  pt is returning to the home with her husband. At discharge, do you have transportation home?: Yes,  husband will provide transportation. Do you have the ability to pay for your medications: Yes,  Medicaid  Release of information consent forms completed and in the chart;  Patient's signature needed at discharge.  Patient to Follow up at: [facility]. Go on 07/21/2019.   Specialty: Family Medicine Why: Please follow up with Raquel Sarna, NP on Thursday, July 23 at 11:20am. They will call you the day before to see if you would like in person or virtual visit. Thank you. Contact information: [address] [place] [address] [phone]           Next level of care provider has access to [place] and Suicide Prevention discussed: Yes,  SPE completed with patient's husband  Have you used any form of tobacco in the last 30 days? (Cigarettes, Smokeless Tobacco, Cigars, and/or Pipes): No  Has patient been referred to the Quitline?: N/A patient is not a smoker  Patient has been referred for addiction treatment: Mack, LCSW 07/14/2019, 9:42 AM

## 2019-07-14 NOTE — BHH Group Notes (Signed)
LCSW Group Therapy Note  07/14/2019 1:00 PM  Type of Therapy/Topic:  Group Therapy:  Balance in Life  Participation Level:  Did Not Attend  Description of Group:    This group will address the concept of balance and how it feels and looks when one is unbalanced. Patients will be encouraged to process areas in their lives that are out of balance and identify reasons for remaining unbalanced. Facilitators will guide patients in utilizing problem-solving interventions to address and correct the stressor making their life unbalanced. Understanding and applying boundaries will be explored and addressed for obtaining and maintaining a balanced life. Patients will be encouraged to explore ways to assertively make their unbalanced needs known to significant others in their lives, using other group members and facilitator for support and feedback.  Therapeutic Goals: 1. Patient will identify two or more emotions or situations they have that consume much of in their lives. 2. Patient will identify signs/triggers that life has become out of balance:  3. Patient will identify two ways to set boundaries in order to achieve balance in their lives:  4. Patient will demonstrate ability to communicate their needs through discussion and/or role plays  Summary of Patient Progress: X  Therapeutic Modalities:   Cognitive Behavioral Therapy Solution-Focused Therapy Assertiveness Training  Debbie Golden MSW, LCSW 07/14/2019 10:37 AM    

## 2019-07-14 NOTE — Progress Notes (Signed)
Recreation Therapy Notes  Date: 07/14/2019  Time: 9:30 am  Location: Craft room  Behavioral response: Appropriate   Intervention Topic: Self-esteem  Discussion/Intervention:  Group content today was focused on self-esteem. Patient defined self-esteem and where it comes form. The group described reasons self-esteem is important. Individuals stated things that impact self-esteem and positive ways to improve self-esteem. The group participated in the intervention "Improving Me" where patients were able to identify ways, they could improve their self-esteem. Clinical Observations/Feedback:  Patient came to group and stated that she could improve her self-esteem by believing in her self and using positive affirmations. Individual was social with peers and staff while participating in the intervention.  [name] LRT/CTRS         Vertis Bauder 07/14/2019 10:59 AM

## 2019-07-14 NOTE — Discharge Summary (Signed)
Physician Discharge Summary Note  Patient:  Debbie PatienceHolly Golden is an 48 y.o., female MRN:  [id number] DOB:  Nov 01, 1971 Patient phone:  [phone] (home)  Patient address:   [address] [place] [address],  Total Time spent with patient: 45 minutes  Date of Admission:  07/12/2019 Date of Discharge: July 14, 2019  Reason for Admission: Patient was admitted in transfer from the medical service.  She had presented initially to the hospital with near catatonic symptoms and a urinary tract infection.  Was admitted to the medical service for stabilization and seen by psychiatric consultant and then transferred to psychiatric service.  Principal Problem: Bipolar disorder (manic depression) (HCC) Discharge Diagnoses: Principal Problem:   Bipolar disorder (manic depression) (HCC) Active Problems:   Major depression   MDD (major depressive disorder)   Past Psychiatric History: History of chronic depression mood instability anxiety multiple somatic complaints possible bipolar disorder  Past Medical History:  Past Medical History:  Diagnosis Date  . Bipolar disorder (HCC)   . Depression   . Genital herpes   . Obesity   . Weakness     Past Surgical History:  Procedure Laterality Date  . ANKLE FRACTURE SURGERY Right   . GASTRIC BYPASS N/A 2007  . ORIF ANKLE FRACTURE Left 04/02/2018   Procedure: OPEN REDUCTION INTERNAL FIXATION (ORIF) ANKLE FRACTURE;  Surgeon: Deeann SaintMiller, Howard, MD;  Location: [facility] ORS;  Service: Orthopedics;  Laterality: Left;   Family History:  Family History  Adopted: Yes   Family Psychiatric  History: See previous Social History:  Social History   Substance and Sexual Activity  Alcohol Use Yes   Comment: 3 drinks per week     Social History   Substance and Sexual Activity  Drug Use No    Social History   Socioeconomic History  . Marital status: Single    Spouse name: Not on file  . Number of children: 0  . Years of education: 16  . Highest education  level: Master's degree (e.g., MA, MS, MEng, MEd, MSW, MBA)  Occupational History  . 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 Financial resource strain: Somewhat hard  . Food insecurity    Worry: Sometimes true    Inability: Never true  . Transportation needs    Medical: No    Non-medical: Yes  Tobacco Use  . Smoking status: Never Smoker  . Smokeless tobacco: Never Used  Substance and Sexual Activity  . Alcohol use: Yes    Comment: 3 drinks per week  . Drug use: No  . Sexual activity: Not on file  Lifestyle  . Physical activity    Days per week: 0 days    Minutes per session: 0 min  . Stress: Very much  Relationships  . Social connections    Talks on phone: More than three times a week    Gets together: More than three times a week    Attends religious service: Never    Active member of club or organization: Yes    Attends meetings of clubs or organizations: Never    Relationship status: Living with partner  Other Topics Concern  . Not on file  Social History Narrative   Lives at home with her fiance, Alinda Moneyony.   Right-handed.   One cup caffeine per day.    Hospital Course: Patient admitted to the psychiatric service was already more responsive appropriate and interactive when she came downstairs.  15-minute checks employed.  Patient did not show any dangerous  suicidal or aggressive behavior.  Patient was continued on her outpatient psychiatric medicine.  By the time I saw her she was awake alert and appeared to be returning to her baseline.  No evidence of any acute psychosis or suicidality.  Patient was agreeable to continuing her current psychiatric medicine.  I spoke to her boyfriend who has been living with her for many years and is very familiar with her.  He had spoken with her as well and felt that she was returning to baseline and was safe for discharge  Physical Findings: AIMS:  , ,  ,  ,    CIWA:    COWS:     Musculoskeletal: Strength & Muscle Tone: within  normal limits Gait & Station: normal Patient leans: N/A  Psychiatric Specialty Exam: Physical Exam  Nursing note and vitals reviewed. Constitutional: She appears well-developed and well-nourished.  HENT:  Head: Normocephalic and atraumatic.  Eyes: Pupils are equal, round, and reactive to light. Conjunctivae are normal.  Neck: Normal range of motion.  Cardiovascular: Regular rhythm and normal heart sounds.  Respiratory: Effort normal. No respiratory distress.  GI: Soft.  Musculoskeletal: Normal range of motion.  Neurological: She is alert.  Skin: Skin is warm and dry.  Psychiatric: She has a normal mood and affect. Her behavior is normal. Judgment and thought content normal.    Review of Systems  Constitutional: Negative.   HENT: Negative.   Eyes: Negative.   Respiratory: Negative.   Cardiovascular: Negative.   Gastrointestinal: Negative.   Musculoskeletal: Negative.   Skin: Negative.   Neurological: Negative.   Psychiatric/Behavioral: Negative.     Blood pressure 96/68, pulse 80, temperature 98.4 F (36.9 C), temperature source Oral, resp. rate 16, height 5\' 8"  (1.727 m), weight 119 kg, SpO2 98 %.Body mass index is 39.89 kg/m.  General Appearance: Casual  Eye Contact:  Good  Speech:  Clear and Coherent  Volume:  Normal  Mood:  Euthymic  Affect:  Congruent  Thought Process:  Goal Directed  Orientation:  Full (Time, Place, and Person)  Thought Content:  Logical  Suicidal Thoughts:  No  Homicidal Thoughts:  No  Memory:  Immediate;   Fair Recent;   Fair Remote;   Fair  Judgement:  Fair  Insight:  Fair  Psychomotor Activity:  Normal  Concentration:  Concentration: Fair  Recall:  Eva of Knowledge:  Fair  Language:  Fair  Akathisia:  No  Handed:  Right  AIMS (if indicated):     Assets:  Desire for Improvement Housing  ADL's:  Intact  Cognition:  WNL  Sleep:  Number of Hours: 6     Have you used any form of tobacco in the last 30 days? (Cigarettes,  Smokeless Tobacco, Cigars, and/or Pipes): No  Has this patient used any form of tobacco in the last 30 days? (Cigarettes, Smokeless Tobacco, Cigars, and/or Pipes) Yes, No  Blood Alcohol level:  Lab Results  Component Value Date   ETH <10 07/07/2019   ETH <10 76/28/3151    Metabolic Disorder Labs:  Lab Results  Component Value Date   HGBA1C 4.5 (L) 03/12/2018   No results found for: PROLACTIN Lab Results  Component Value Date   CHOL 200 (H) 06/10/2019   TRIG 101 06/10/2019   HDL 76 06/10/2019   CHOLHDL 2.6 06/10/2019   VLDL 19 06/22/2015   LDLCALC 105 (H) 06/10/2019   LDLCALC 97 06/22/2015    See Psychiatric Specialty Exam and Suicide Risk Assessment completed by  Attending Physician prior to discharge.  Discharge destination:  Home  Is patient on multiple antipsychotic therapies at discharge:  No   Has Patient had three or more failed trials of antipsychotic monotherapy by history:  No  Recommended Plan for Multiple Antipsychotic Therapies: NA  Discharge Instructions    Diet - low sodium heart healthy   Complete by: As directed    Increase activity slowly   Complete by: As directed      Allergies as of 07/14/2019   No Known Allergies     Medication List    STOP taking these medications   acetaminophen 325 MG tablet Commonly known as: TYLENOL   hydrOXYzine 25 MG tablet Commonly known as: ATARAX/VISTARIL   pyridOXINE 100 MG tablet Commonly known as: VITAMIN B-6   VITAMIN D3 PO     TAKE these medications     Indication  DULoxetine 60 MG capsule Commonly known as: CYMBALTA Take 1 capsule (60 mg total) by mouth daily.  Indication: Major Depressive Disorder   gabapentin 600 MG tablet Commonly known as: NEURONTIN Take 1 tablet (600 mg total) by mouth 3 (three) times daily.  Indication: Fibromyalgia Syndrome   lamoTRIgine 100 MG tablet Commonly known as: LAMICTAL Take 1 tablet (100 mg total) by mouth daily.  Indication: Depressive Phase of  Manic-Depression   lisinopril 10 MG tablet Commonly known as: ZESTRIL Take 1 tablet (10 mg total) by mouth daily.  Indication: High Blood Pressure Disorder   risperiDONE 2 MG tablet Commonly known as: RISPERDAL Take 1 tablet (2 mg total) by mouth at bedtime.  Indication: MIXED BIPOLAR AFFECTIVE DISORDER   vitamin B-12 1000 MCG tablet Commonly known as: CYANOCOBALAMIN Take 1 tablet (1,000 mcg total) by mouth daily.  Indication: Pernicious Anemia      Follow-up Information    [facility] [place]   Specialty: Family Medicine Why: Please follow up with Irving BurtonEmily, NP on Thursday, July 23 at 11:20am. They will call you the day before to see if you would like in person or virtual visit. Thank you. Contact information: [address] [place] [address] [phone]           Follow-up recommendations:  Activity:  Activity as tolerated Diet:  Regular diet Other:  Continue current psychiatric medicine.  Do not stop medicine.  Take care of health.  Go to outpatient treatment as previously arranged.  Work with your fianc to maintain medication compliance  Comments: Patient will be discharged today and appears to be back to her baseline tolerating medicine well.  Does not meet commitment criteria.  No sign of dangerousness.  Signed: Mordecai RasmussenJohn Ailani Governale, MD 07/14/2019, 9:43 AM

## 2019-07-14 NOTE — Progress Notes (Signed)
Patient denies SI/HI, denies A/V hallucinations. Patient verbalizes understanding of discharge instructions, follow up care and prescriptions. Patient given all belongings from [facility] locker. Patient escorted out by staff, transported by family. 

## 2019-07-15 NOTE — Discharge Summary (Signed)
[facility] [place] [facility]   PATIENT NAME: Debbie Golden    MR#:  [id number]  DATE OF BIRTH:  May 27, 1971  DATE OF ADMISSION:  07/07/2019 ADMITTING PHYSICIAN: Debbie Manisavid Willis, MD  DATE OF DISCHARGE: 07/12/2019 10:30 PM  PRIMARY CARE PHYSICIAN: Debbie CustardBoyce, Emily E, FNP    ADMISSION DIAGNOSIS:  Catatonia [F06.1] Urinary tract infection, acute [N39.0] Cystitis [N30.90] Sepsis, due to unspecified organism, unspecified whether acute organ dysfunction present (HCC) [A41.9]  DISCHARGE DIAGNOSIS:  Principal Problem:   Severe sepsis (HCC) Active Problems:   GERD (gastroesophageal reflux disease)   Catatonia   UTI (urinary tract infection)   AKI (acute kidney injury) (HCC)   SECONDARY DIAGNOSIS:   Past Medical History:  Diagnosis Date  . Bipolar disorder (HCC)   . Depression   . Genital herpes   . Obesity   . Weakness     HOSPITAL COURSE:   Severe sepsis (HCC) -lactic acid within normal limits, blood pressure stable, IV antibiotics given, cultures sent ,  Urine culture report reviewed with Debbie Golden. Change to oral Abx now as she is taking oral.  *Catatonia   -psychiatry following, defer to their recommendations for treatment of this problem -started on Zyprexa IM. Now patient improved and started taking oral medications and food, she is stable to be transferred to psychiatric floor but as per psychiatrist there is no beds available on the floor so social worker is looking for transfer to other psychiatric facilities.  UTI (urinary tract infection) -IV antibiotics and culture as above  AKI (acute kidney injury) (HCC) -IV fluids tonight, avoid nephrotoxins and monitor    Kidney function improved now.  DISCHARGE CONDITIONS:   Stable.  CONSULTS OBTAINED:  Treatment Team:  Debbie Golden, Debbie Hurless, MD  DRUG ALLERGIES:  No Known Allergies  DISCHARGE MEDICATIONS:   Allergies as of 07/12/2019   No Known Allergies     Medication List     You have not been prescribed any medications.      DISCHARGE INSTRUCTIONS:    Follow advise by Psych team.  If you experience worsening of your admission symptoms, develop shortness of breath, life threatening emergency, suicidal or homicidal thoughts you must seek medical attention immediately by calling 911 or calling your MD immediately  if symptoms less severe.  You Must read complete instructions/literature along with all the possible adverse reactions/side effects for all the Medicines you take and that have been prescribed to you. Take any new Medicines after you have completely understood and accept all the possible adverse reactions/side effects.   Please note  You were cared for by a hospitalist during your hospital stay. If you have any questions about your discharge medications or the care you received while you were in the hospital after you are discharged, you can call the unit and asked to speak with the hospitalist on call if the hospitalist that took care of you is not available. Once you are discharged, your primary care physician will handle any further medical issues. Please note that NO REFILLS for any discharge medications will be authorized once you are discharged, as it is imperative that you return to your primary care physician (or establish a relationship with a primary care physician if you do not have one) for your aftercare needs so that they can reassess your need for medications and monitor your lab values.    Today   CHIEF COMPLAINT:   Chief Complaint  Patient presents with  . Psychiatric Evaluation  HISTORY OF PRESENT ILLNESS:  Debbie Golden  is a 48 y.o. female Patient brought to the ED for catatonia.  Evaluated here by psychiatry with recommendations made, they are following along.  She subsequently became febrile, tachycardic.  On evaluation she had nitrites positive on her UA.  Hospitalist called for admission and treatment of her sepsis and UTI     VITAL SIGNS:  Blood pressure (!) 122/55, pulse 89, temperature 98.4 F (36.9 C), temperature source Oral, resp. rate 18, height 5\' 8"  (1.727 m), weight 119.1 kg, SpO2 100 %.  I/O:  No intake or output data in the 24 hours ending 07/15/19 1640  PHYSICAL EXAMINATION:   Constitutional: She appearswell-developedand well-nourished.No distress.  HENT:  Head:Normocephalicand atraumatic.  Mouth/Throat:Oropharynx is clear and moist.  Eyes:Pupils are equal, round, and reactive to light.Conjunctivaeand EOMare normal. No scleral icterus.  Neck:Normal range of motion.Neck supple.No JVDpresent. No thyromegalypresent.  Cardiovascular:Normal rate,regular rhythmand intact distal pulses. Exam revealsno gallopand no friction rub. No murmurheard. Respiratory:Effort normaland breath sounds normal. Norespiratory distress. She hasno wheezes. She hasno rales.  GQ:QPYP.Bowel sounds are normal. She exhibitsno distension. There isno abdominal tenderness.  Musculoskeletal:Normal range of motion.  General: No edema.  Comments: No arthritis, no gout Lymphadenopathy:  She has no cervical adenopathy.  Neurological:  Moving all 4 limbs but appears calm today Skin: Skin iswarmand dry.No rashnoted. No erythema.  Psychiatric: Unable to assess due to patient condition   DATA REVIEW:   CBC Recent Labs  Lab 07/12/19 0542  WBC 4.7  HGB 10.4*  HCT 33.2*  PLT 179    Chemistries  Recent Labs  Lab 07/12/19 0542  NA 144  K 3.4*  CL 113*  CO2 21*  GLUCOSE 92  BUN 10  CREATININE 0.66  CALCIUM 9.2  MG 1.6*    Cardiac Enzymes No results for input(s): TROPONINI in the last 168 hours.  Microbiology Results  Results for orders placed or performed during the hospital encounter of 07/07/19  Urine Culture     Status: Abnormal   Collection Time: 07/07/19  8:40 AM   Specimen: Urine, Random  Result Value Ref Range Status   Specimen Description    Final    URINE, RANDOM Performed at [facility], [address], [place], [place] [address]    Special Requests   Final    NONE Performed at [facility], [address] [place],  [address]    Culture >=100,000 COLONIES/mL ESCHERICHIA Golden (A)  Final   Report Status 07/09/2019 FINAL  Final   Organism ID, Bacteria ESCHERICHIA Golden (A)  Final      Susceptibility   Escherichia Golden - MIC*    AMPICILLIN >=32 RESISTANT Resistant     CEFAZOLIN <=4 SENSITIVE Sensitive     CEFTRIAXONE <=1 SENSITIVE Sensitive     CIPROFLOXACIN <=0.25 SENSITIVE Sensitive     GENTAMICIN <=1 SENSITIVE Sensitive     IMIPENEM <=0.25 SENSITIVE Sensitive     NITROFURANTOIN <=16 SENSITIVE Sensitive     TRIMETH/SULFA <=20 SENSITIVE Sensitive     AMPICILLIN/SULBACTAM 16 INTERMEDIATE Intermediate     PIP/TAZO <=4 SENSITIVE Sensitive     Extended ESBL NEGATIVE Sensitive     * >=100,000 COLONIES/mL ESCHERICHIA Golden  SARS Coronavirus 2 (CEPHEID- Performed in [place] hospital lab), Hosp Order     Status: None   Collection Time: 07/08/19  7:00 PM   Specimen: Nasopharyngeal Swab  Result Value Ref Range Status   SARS Coronavirus 2 NEGATIVE NEGATIVE Final  Comment: (NOTE) If result is NEGATIVE SARS-CoV-2 target nucleic acids are NOT DETECTED. The SARS-CoV-2 RNA is generally detectable in upper and lower  respiratory specimens during the acute phase of infection. The lowest  concentration of SARS-CoV-2 viral copies this assay can detect is 250  copies / mL. A negative result does not preclude SARS-CoV-2 infection  and should not be used as the sole basis for treatment or other  patient management decisions.  A negative result may occur with  improper specimen collection / handling, submission of specimen other  than nasopharyngeal swab, presence of viral mutation(s) within the  areas targeted by this assay, and inadequate number of viral copies  (<250 copies / mL). A negative result  must be combined with clinical  observations, patient history, and epidemiological information. If result is POSITIVE SARS-CoV-2 target nucleic acids are DETECTED. The SARS-CoV-2 RNA is generally detectable in upper and lower  respiratory specimens dur ing the acute phase of infection.  Positive  results are indicative of active infection with SARS-CoV-2.  Clinical  correlation with patient history and other diagnostic information is  necessary to determine patient infection status.  Positive results do  not rule out bacterial infection or co-infection with other viruses. If result is PRESUMPTIVE POSTIVE SARS-CoV-2 nucleic acids MAY BE PRESENT.   A presumptive positive result was obtained on the submitted specimen  and confirmed on repeat testing.  While 2019 novel coronavirus  (SARS-CoV-2) nucleic acids may be present in the submitted sample  additional confirmatory testing may be necessary for epidemiological  and / or clinical management purposes  to differentiate between  SARS-CoV-2 and other Sarbecovirus currently known to infect humans.  If clinically indicated additional testing with an alternate test  methodology [id number]) is advised. The SARS-CoV-2 RNA is generally  detectable in upper and lower respiratory sp ecimens during the acute  phase of infection. The expected result is Negative. Fact Sheet for Patients:  BoilerBrush.com.cyhttps://www.fda.gov/media/136312/download Fact Sheet for Healthcare Providers: https://pope.com/https://www.fda.gov/media/136313/download This test is not yet approved or cleared by the [place] FDA and has been authorized for detection and/or diagnosis of SARS-CoV-2 by FDA under an Emergency Use Authorization (EUA).  This EUA will remain in effect (meaning this test can be used) for the duration of the COVID-19 declaration under Section 564(b)(1) of the Act, 21 U.S.C. section 360bbb-3(b)(1), unless the authorization is terminated or revoked sooner. Performed at [facility], [address] [place], [place] [address]   Blood Culture (routine x 2)     Status: None   Collection Time: 07/08/19  7:12 PM   Specimen: BLOOD  Result Value Ref Range Status   Specimen Description BLOOD RIGHT ANTECUBITAL  Final   Special Requests   Final    BOTTLES DRAWN AEROBIC AND ANAEROBIC Blood Culture adequate volume   Culture   Final    NO GROWTH 5 DAYS Performed at [facility], [address] [place], [place] [address]    Report Status 07/13/2019 FINAL  Final  Blood Culture (routine x 2)     Status: None   Collection Time: 07/08/19  7:12 PM   Specimen: BLOOD  Result Value Ref Range Status   Specimen Description BLOOD LEFT ANTECUBITAL  Final   Special Requests   Final    BOTTLES DRAWN AEROBIC AND ANAEROBIC Blood Culture results may not be optimal due to an excessive volume of blood received in culture bottles   Culture   Final    NO GROWTH 5 DAYS Performed  at [facility], [address] [place], [place] [address]    Report Status 07/13/2019 FINAL  Final    RADIOLOGY:  No results found.  EKG:   Orders placed or performed during the hospital encounter of 07/07/19  . EKG 12-Lead  . EKG 12-Lead      Management plans discussed with the patient, family and they are in agreement.  CODE STATUS:  Code Status History    Date Active Date Inactive Code Status Order ID Comments User Context   07/12/2019 2250 07/14/2019 1626 Full Code [id number]  Catalina Gravelhomspon, Jacqueline, NP Inpatient   07/08/2019 2342 07/12/2019 2238 Full Code [id number]  Debbie ManisWillis, David, MD Inpatient   04/03/2018 0052 04/05/2018 2103 Full Code [id number]  Deeann SaintMiller, Howard, MD Inpatient   01/20/2018 0754 02/01/2018 1803 Full Code [id number]  Russella DarEllis, Allison L, NP ED   06/22/2015 0202 06/25/2015 2007 Full Code [id number]  Clapacs, Jackquline DenmarkJohn T, MD Inpatient   Advance Care Planning Activity      TOTAL TIME TAKING CARE OF THIS PATIENT: 35 minutes.    Debbie DillingVaibhavkumar Danen Lapaglia M.D on 07/15/2019 at  4:40 PM  Between 7am to 6pm - Pager - [phone]  After 6pm go to [email or web address] - password [employer] [email or web address]  [facility]  Office  [phone]  CC: Primary care physician; Debbie CustardBoyce, Emily E, FNP   Note: This dictation was prepared with Dragon dictation along with smaller phrase technology. Any transcriptional errors that result from this process are unintentional.

## 2019-07-21 ENCOUNTER — Encounter: Payer: Self-pay | Admitting: Family Medicine

## 2019-07-21 ENCOUNTER — Ambulatory Visit (INDEPENDENT_AMBULATORY_CARE_PROVIDER_SITE_OTHER): Payer: Medicaid Other | Admitting: Family Medicine

## 2019-07-21 DIAGNOSIS — G610 Guillain-Barre syndrome: Secondary | ICD-10-CM

## 2019-07-21 DIAGNOSIS — E876 Hypokalemia: Secondary | ICD-10-CM

## 2019-07-21 DIAGNOSIS — G6289 Other specified polyneuropathies: Secondary | ICD-10-CM | POA: Diagnosis not present

## 2019-07-21 DIAGNOSIS — F3164 Bipolar disorder, current episode mixed, severe, with psychotic features: Secondary | ICD-10-CM

## 2019-07-21 DIAGNOSIS — Z13220 Encounter for screening for lipoid disorders: Secondary | ICD-10-CM

## 2019-07-21 DIAGNOSIS — R29898 Other symptoms and signs involving the musculoskeletal system: Secondary | ICD-10-CM | POA: Diagnosis not present

## 2019-07-21 DIAGNOSIS — N179 Acute kidney failure, unspecified: Secondary | ICD-10-CM

## 2019-07-21 DIAGNOSIS — Z131 Encounter for screening for diabetes mellitus: Secondary | ICD-10-CM

## 2019-07-21 DIAGNOSIS — E6609 Other obesity due to excess calories: Secondary | ICD-10-CM

## 2019-07-21 DIAGNOSIS — N3090 Cystitis, unspecified without hematuria: Secondary | ICD-10-CM

## 2019-07-21 NOTE — Progress Notes (Signed)
Name: Debbie Golden Twitty   MRN: [id number]    DOB: 1971-05-09   Date:07/21/2019       Progress Note  Subjective  Chief Complaint  Chief Complaint  Patient presents with  . Hospitalization Follow-up  . Depression    I connected with  Debbie Golden Molino  on 07/21/19 at 11:20 AM EDT by a video enabled telemedicine application and verified that I am speaking with the correct person using two identifiers.  I discussed the limitations of evaluation and management by telemedicine and the availability of in person appointments. The patient expressed understanding and agreed to proceed. Staff also discussed with the patient that there may be a patient responsible charge related to this service. Patient Location: Home Provider Location: Office Additional Individuals present: None  HPI   Pt presents for hospital admission follow up.  She was initially admitted for near catatonic sympoms and UTI, was medically stabilized, then sent to psychiatric inpatient care under Dr. Toni Amendlapacs.  There were no medication changes made as she was improving on her own, and was discharged home with her boyfriend as support.    She was admitted 07/07/2019, transferred to psychiatric unit 07/12/2019, discharged from psychiatric unit 07/14/2019.  She denies SI or self harm since discharge.  Still struggling with depression - hates sitting around all day and not being able to take care of herself like she used to.  She has a virtual therapy appointment August 29 2019.  She is taking risperdal, lamictal, and cymbalta.  She was seeing [facility] in [place], but does not have transportation now, so she is going to [facility].   - She completed IV antibiotics, kidney function had returned to normal prior to discharge; WBC's were normal; anemia was mild and stable. Denies abdominal pain, dysuria, flank/back pain.  Staying hydrated. - She states that she has had some trouble adjusting  to being home.  She notes that it is hard for her get around her home due to the steps due to her neuropathy - has been sleeping on the couch because she cannot do the steps to get to her room. She has a walker, a sink chair and shower chair.  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  Saw Dr. Malvin JohnsPotter 06/30/2019 and increased her gabapentin dosing, ordered an EMG to evaluate further - this is scheduled in August. She declines home health or PT at this time. Patient Active Problem List   Diagnosis Date Noted  . MDD (major depressive disorder) 07/12/2019  . Severe sepsis (HCC) 07/08/2019  . UTI (urinary tract infection) 07/08/2019  . AKI (acute kidney injury) (HCC) 07/08/2019  . Peripheral neuropathy 06/14/2018  . Closed left ankle fracture, initial encounter 04/01/2018  . Paresthesia 02/23/2018  . Weakness 02/23/2018  . Gait abnormality 02/23/2018  . Acute encephalopathy   . Alcohol withdrawal syndrome, with delirium (HCC)   . Altered mental status   . Leg weakness, bilateral 01/20/2018  . Obesity 01/20/2018  . Bipolar disorder (HCC) 01/20/2018  . Macrocytic anemia 01/20/2018  . Acute hypokalemia 01/20/2018  . Alcohol use 01/20/2018  . Guillain Barr syndrome (HCC)   . Suicide attempt (HCC) 01/19/2018  . H/O gastric bypass 01/19/2018  . Catatonia 11/10/2016  . Noncompliance 11/10/2016  . Involuntary commitment 11/10/2016  . GERD (gastroesophageal reflux disease) 06/25/2015  . Bipolar disorder, curr episode mixed, severe, with psychotic features (HCC) 06/22/2015  . Bipolar disorder (manic depression) (HCC)  06/21/2015  . Hypokalemia 06/21/2015  . Delirium, acute 06/19/2015  . Major depression 06/19/2015  . Alcohol abuse 06/19/2015    Past Surgical History:  Procedure Laterality Date  . ANKLE FRACTURE SURGERY Right   . GASTRIC BYPASS N/A 2007  . ORIF ANKLE FRACTURE Left 04/02/2018   Procedure: OPEN REDUCTION INTERNAL FIXATION (ORIF) ANKLE FRACTURE;  Surgeon:  Earnestine Leys, MD;  Location: [facility] ORS;  Service: Orthopedics;  Laterality: Left;    Family History  Adopted: Yes    Social History   Socioeconomic History  . Marital status: Single    Spouse name: Not on file  . Number of children: 0  . Years of education: 65  . Highest education level: Master's degree (e.g., MA, MS, MEng, MEd, MSW, MBA)  Occupational History  . Occupation: Press photographer  Social Needs  . Financial resource strain: Somewhat hard  . Food insecurity    Worry: Sometimes true    Inability: Never true  . Transportation needs    Medical: No    Non-medical: Yes  Tobacco Use  . Smoking status: Never Smoker  . Smokeless tobacco: Never Used  Substance and Sexual Activity  . Alcohol use: Yes    Comment: 3 drinks per week  . Drug use: No  . Sexual activity: Not on file  Lifestyle  . Physical activity    Days per week: 0 days    Minutes per session: 0 min  . Stress: Very much  Relationships  . Social connections    Talks on phone: More than three times a week    Gets together: More than three times a week    Attends religious service: Never    Active member of club or organization: Yes    Attends meetings of clubs or organizations: Never    Relationship status: Living with partner  . Intimate partner violence    Fear of current or ex partner: No    Emotionally abused: No    Physically abused: No    Forced sexual activity: No  Other Topics Concern  . Not on file  Social History Narrative   Lives at home with her fiance, Nicole Kindred.   Right-handed.   One cup caffeine per day.     Current Outpatient Medications:  .  Cholecalciferol (VITAMIN D) 50 MCG (2000 UT) CAPS, Take by mouth., Disp: , Rfl:  .  DULoxetine (CYMBALTA) 60 MG capsule, Take 1 capsule (60 mg total) by mouth daily., Disp: 30 capsule, Rfl: 1 .  gabapentin (NEURONTIN) 600 MG tablet, Take 1 tablet (600 mg total) by mouth 3 (three) times daily., Disp: 90 tablet, Rfl: 1 .  lamoTRIgine (LAMICTAL)  100 MG tablet, Take 1 tablet (100 mg total) by mouth daily., Disp: 30 tablet, Rfl: 1 .  lisinopril (ZESTRIL) 10 MG tablet, Take 1 tablet (10 mg total) by mouth daily., Disp: 30 tablet, Rfl: 1 .  risperiDONE (RISPERDAL) 2 MG tablet, Take 1 tablet (2 mg total) by mouth at bedtime., Disp: 30 tablet, Rfl: 1 .  thiamine (VITAMIN B-1) 100 MG tablet, Take 100 mg by mouth daily., Disp: , Rfl:  .  vitamin B-12 (CYANOCOBALAMIN) 1000 MCG tablet, Take 1 tablet (1,000 mcg total) by mouth daily., Disp: 30 tablet, Rfl: 1 .  vitamin B-6 (PYRIDOXINE) 25 MG tablet, Take 25 mg by mouth daily., Disp: , Rfl:   No Known Allergies  I personally reviewed active problem list, medication list, allergies, notes from last encounter, lab results with the patient/caregiver today.  ROS  Constitutional: Negative for fever or weight change.  Respiratory: Negative for cough and shortness of breath.   Cardiovascular: Negative for chest pain or palpitations.  Gastrointestinal: Negative for abdominal pain, no bowel changes.  Musculoskeletal: Negative for gait problem or joint swelling.  Skin: Negative for rash.  Neurological: Negative for dizziness or headache.  No other specific complaints in a complete review of systems (except as listed in HPI above).  Objective  Virtual encounter, vitals not obtained.  There is no height or weight on file to calculate BMI.  Physical Exam Constitutional: Patient appears well-developed and well-nourished. No distress.  HENT: Head: Normocephalic and atraumatic.  Neck: Normal range of motion. Pulmonary/Chest: Effort normal. No respiratory distress. Speaking in complete sentences Neurological: Pt is alert and oriented to person, place, and time. Coordination, speech are normal.  Psychiatric: Patient has a normal mood and affect. behavior is normal. Judgment and thought content normal.  No results found for this or any previous visit (from the past 72 hour(s)).  PHQ2/9: Depression  screen [facility] 07/21/2019 06/10/2019  Decreased Interest 3 1  Down, Depressed, Hopeless 3 1  PHQ - 2 Score 6 2  Altered sleeping 2 0  Tired, decreased energy 3 1  Change in appetite 0 0  Feeling bad or failure about yourself  3 1  Trouble concentrating 0 0  Moving slowly or fidgety/restless 0 0  Suicidal thoughts 0 0  PHQ-9 Score 14 4  Difficult doing work/chores Very difficult Somewhat difficult   PHQ-2/9 Result is positive.    Fall Risk: Fall Risk  07/21/2019 06/10/2019  Falls in the past year? 0 0  Number falls in past yr: 0 0  Injury with Fall? 0 0    Assessment & Plan  1. Leg weakness, bilateral - Needs EMG per Dr. Malvin JohnsPotter - scheduled for August  2. Other polyneuropathy - COMPLETE METABOLIC PANEL WITH GFR - CBC with Differential/Platelet  3. Guillain Barr syndrome (HCC) - Needs EMG per Dr. Malvin JohnsPotter - scheduled for August  4. Bipolar disorder, curr episode mixed, severe, with psychotic features (HCC) - Needs to maintain follow up with therapist and psychiatrist, will call if needing any new referrals.  Overall adjustment has been more difficult than she expected coming home, but is coping and is not posing a danger to herself or others  5. Hypokalemia - COMPLETE METABOLIC PANEL WITH GFR  6. Obesity due to excess calories with serious comorbidity, unspecified classification - Hemoglobin A1c - Lipid panel  7. Cystitis - COMPLETE METABOLIC PANEL WITH GFR - CBC with Differential/Platelet  8. AKI (acute kidney injury) (HCC) - COMPLETE METABOLIC PANEL WITH GFR - CBC with Differential/Platelet  9. Diabetes mellitus screening - Hemoglobin A1c - COMPLETE METABOLIC PANEL WITH GFR  10. Lipid screening - Lipid panel  I discussed the assessment and treatment plan with the patient. The patient was provided an opportunity to ask questions and all were answered. The patient agreed with the plan and demonstrated an understanding of the instructions.  The patient was advised  to call back or seek an in-person evaluation if the symptoms worsen or if the condition fails to improve as anticipated.  I provided 26 minutes of non-face-to-face time during this encounter.

## 2019-09-12 ENCOUNTER — Encounter: Payer: Medicaid Other | Admitting: Family Medicine

## 2019-11-01 ENCOUNTER — Encounter: Payer: Medicaid Other | Admitting: Family Medicine

## 2019-12-05 ENCOUNTER — Emergency Department
Admission: EM | Admit: 2019-12-05 | Discharge: 2019-12-06 | Disposition: A | Discharge status: Other Acute Inpatient Hospital | Payer: Medicaid Other | Attending: Emergency Medicine | Admitting: Emergency Medicine

## 2019-12-05 ENCOUNTER — Other Ambulatory Visit: Payer: Self-pay

## 2019-12-05 ENCOUNTER — Emergency Department: Payer: Medicaid Other

## 2019-12-05 ENCOUNTER — Encounter: Payer: Self-pay | Admitting: Emergency Medicine

## 2019-12-05 DIAGNOSIS — R531 Weakness: Secondary | ICD-10-CM | POA: Diagnosis present

## 2019-12-05 DIAGNOSIS — K7200 Acute and subacute hepatic failure without coma: Secondary | ICD-10-CM | POA: Diagnosis not present

## 2019-12-05 DIAGNOSIS — T391X1A Poisoning by 4-Aminophenol derivatives, accidental (unintentional), initial encounter: Secondary | ICD-10-CM | POA: Insufficient documentation

## 2019-12-05 DIAGNOSIS — Z20828 Contact with and (suspected) exposure to other viral communicable diseases: Secondary | ICD-10-CM | POA: Diagnosis not present

## 2019-12-05 DIAGNOSIS — Z79899 Other long term (current) drug therapy: Secondary | ICD-10-CM | POA: Insufficient documentation

## 2019-12-05 DIAGNOSIS — R17 Unspecified jaundice: Secondary | ICD-10-CM

## 2019-12-05 LAB — HEPATIC FUNCTION PANEL
ALT: 63 U/L — ABNORMAL HIGH (ref 0–44)
AST: 293 U/L — ABNORMAL HIGH (ref 15–41)
Albumin: 2.3 g/dL — ABNORMAL LOW (ref 3.5–5.0)
Alkaline Phosphatase: 176 U/L — ABNORMAL HIGH (ref 38–126)
Bilirubin, Direct: 18.8 mg/dL — ABNORMAL HIGH (ref 0.0–0.2)
Indirect Bilirubin: 9.4 mg/dL — ABNORMAL HIGH (ref 0.3–0.9)
Total Bilirubin: 28.2 mg/dL (ref 0.3–1.2)
Total Protein: 7.7 g/dL (ref 6.5–8.1)

## 2019-12-05 LAB — CBC
HCT: 31.6 % — ABNORMAL LOW (ref 36.0–46.0)
Hemoglobin: 10.2 g/dL — ABNORMAL LOW (ref 12.0–15.0)
MCH: 34.8 pg — ABNORMAL HIGH (ref 26.0–34.0)
MCHC: 32.3 g/dL (ref 30.0–36.0)
MCV: 107.8 fL — ABNORMAL HIGH (ref 80.0–100.0)
Platelets: 335 10*3/uL (ref 150–400)
RBC: 2.93 MIL/uL — ABNORMAL LOW (ref 3.87–5.11)
RDW: 18.3 % — ABNORMAL HIGH (ref 11.5–15.5)
WBC: 15 10*3/uL — ABNORMAL HIGH (ref 4.0–10.5)
nRBC: 0 % (ref 0.0–0.2)

## 2019-12-05 LAB — PROTIME-INR
INR: 1.4 — ABNORMAL HIGH (ref 0.8–1.2)
Prothrombin Time: 17.1 seconds — ABNORMAL HIGH (ref 11.4–15.2)

## 2019-12-05 LAB — URINE DRUG SCREEN, QUALITATIVE (ARMC ONLY)
Amphetamines, Ur Screen: NOT DETECTED
Barbiturates, Ur Screen: NOT DETECTED
Benzodiazepine, Ur Scrn: NOT DETECTED
Cannabinoid 50 Ng, Ur ~~LOC~~: NOT DETECTED
Cocaine Metabolite,Ur ~~LOC~~: NOT DETECTED
MDMA (Ecstasy)Ur Screen: NOT DETECTED
Methadone Scn, Ur: NOT DETECTED
Opiate, Ur Screen: NOT DETECTED
Phencyclidine (PCP) Ur S: NOT DETECTED
Tricyclic, Ur Screen: NOT DETECTED

## 2019-12-05 LAB — BASIC METABOLIC PANEL
Anion gap: 19 — ABNORMAL HIGH (ref 5–15)
BUN: 80 mg/dL — ABNORMAL HIGH (ref 6–20)
CO2: 17 mmol/L — ABNORMAL LOW (ref 22–32)
Calcium: 9.3 mg/dL (ref 8.9–10.3)
Chloride: 91 mmol/L — ABNORMAL LOW (ref 98–111)
Creatinine, Ser: UNDETERMINED mg/dL (ref 0.44–1.00)
Glucose, Bld: 143 mg/dL — ABNORMAL HIGH (ref 70–99)
Potassium: 5.3 mmol/L — ABNORMAL HIGH (ref 3.5–5.1)
Sodium: 127 mmol/L — ABNORMAL LOW (ref 135–145)

## 2019-12-05 LAB — AMMONIA: Ammonia: 39 umol/L — ABNORMAL HIGH (ref 9–35)

## 2019-12-05 LAB — SALICYLATE LEVEL: Salicylate Lvl: UNDETERMINED mg/dL (ref 2.8–30.0)

## 2019-12-05 LAB — ACETAMINOPHEN LEVEL: Acetaminophen (Tylenol), Serum: <10 ug/mL — ABNORMAL LOW (ref 10–30)

## 2019-12-05 LAB — ETHANOL: Alcohol, Ethyl (B): <10 mg/dL (ref <10)

## 2019-12-05 MED ORDER — DEXTROSE 5 % IV SOLN
15.0000 mg/kg/h | INTRAVENOUS | Status: DC
  Administered 2019-12-05 – 2019-12-06 (×2): 15 mg/kg/h via INTRAVENOUS
  Filled 2019-12-05 (×3): qty 120

## 2019-12-05 MED ORDER — SODIUM CHLORIDE 0.9 % IV SOLN
Freq: Once | INTRAVENOUS | Status: AC
  Administered 2019-12-05: 16:00:00 via INTRAVENOUS

## 2019-12-05 MED ORDER — SODIUM CHLORIDE 0.9 % IV SOLN
Freq: Once | INTRAVENOUS | Status: AC
  Administered 2019-12-06: 10:00:00 via INTRAVENOUS

## 2019-12-05 MED ORDER — ACETYLCYSTEINE LOAD VIA INFUSION
150.0000 mg/kg | Freq: Once | INTRAVENOUS | Status: AC
  Administered 2019-12-05: 12930 mg via INTRAVENOUS
  Filled 2019-12-05: qty 324

## 2019-12-05 MED ORDER — SODIUM CHLORIDE 0.9 % IV SOLN
Freq: Once | INTRAVENOUS | Status: AC
  Administered 2019-12-06: 02:00:00 via INTRAVENOUS

## 2019-12-05 NOTE — ED Notes (Signed)
Pt leaving for US.

## 2019-12-05 NOTE — ED Triage Notes (Signed)
Pt reports is not able to walk. Pt states has peripheral neuropathy in her legs and has swelling to both feet.

## 2019-12-05 NOTE — ED Provider Notes (Signed)
[facility] Emergency Department Provider Note   ____________________________________________   First MD Initiated Contact with Patient 12/05/19 1356     (approximate)  I have reviewed the triage vital signs and the nursing notes.   HISTORY  Chief Complaint Leg Swelling and Weakness    HPI Debbie Golden is a 48 y.o. female with history of bipolar disorder and catatonia presents to the ED for generalized weakness.  Patient reports that over the past week she has dealt with increasing weakness in her lower extremities, now making it difficult for her to walk at all.  She states she has chronic neuropathy in her legs and has been dealing with pain in both legs for a long time.  She also complains of generalized weakness and fatigue, feels like her legs have been more swollen than usual.  She denies any fevers, cough, chest pain, shortness of breath.  She has not had any abdominal pain, nausea, vomiting, dysuria, or hematuria.  She denies any recent changes to her medications.        Past Medical History:  Diagnosis Date  . Bipolar disorder (HCC)   . Depression   . Genital herpes   . Obesity   . Weakness     Patient Active Problem List   Diagnosis Date Noted  . MDD (major depressive disorder) 07/12/2019  . Severe sepsis (HCC) 07/08/2019  . UTI (urinary tract infection) 07/08/2019  . AKI (acute kidney injury) (HCC) 07/08/2019  . Peripheral neuropathy 06/14/2018  . Closed left ankle fracture, initial encounter 04/01/2018  . Paresthesia 02/23/2018  . Weakness 02/23/2018  . Gait abnormality 02/23/2018  . Acute encephalopathy   . Alcohol withdrawal syndrome, with delirium (HCC)   . Altered mental status   . Leg weakness, bilateral 01/20/2018  . Obesity 01/20/2018  . Bipolar disorder (HCC) 01/20/2018  . Macrocytic anemia 01/20/2018  . Acute hypokalemia 01/20/2018  . Alcohol use 01/20/2018  . Guillain Barr syndrome (HCC)   . Suicide attempt (HCC)  01/19/2018  . H/O gastric bypass 01/19/2018  . Catatonia 11/10/2016  . Noncompliance 11/10/2016  . Involuntary commitment 11/10/2016  . GERD (gastroesophageal reflux disease) 06/25/2015  . Bipolar disorder, curr episode mixed, severe, with psychotic features (HCC) 06/22/2015  . Bipolar disorder (manic depression) (HCC) 06/21/2015  . Hypokalemia 06/21/2015  . Delirium, acute 06/19/2015  . Major depression 06/19/2015  . Alcohol abuse 06/19/2015    Past Surgical History:  Procedure Laterality Date  . ANKLE FRACTURE SURGERY Right   . GASTRIC BYPASS N/A 2007  . ORIF ANKLE FRACTURE Left 04/02/2018   Procedure: OPEN REDUCTION INTERNAL FIXATION (ORIF) ANKLE FRACTURE;  Surgeon: Deeann SaintMiller, Howard, MD;  Location: [facility] ORS;  Service: Orthopedics;  Laterality: Left;    Prior to Admission medications   Medication Sig Start Date End Date Taking? Authorizing Provider  Cholecalciferol (VITAMIN D) 50 MCG (2000 UT) CAPS Take by mouth.    [provider]  DULoxetine (CYMBALTA) 60 MG capsule Take 1 capsule (60 mg total) by mouth daily. 07/14/19   Clapacs, Jackquline DenmarkJohn T, MD  gabapentin (NEURONTIN) 600 MG tablet Take 1 tablet (600 mg total) by mouth 3 (three) times daily. 07/14/19   Clapacs, Jackquline DenmarkJohn T, MD  lamoTRIgine (LAMICTAL) 100 MG tablet Take 1 tablet (100 mg total) by mouth daily. 07/14/19   Clapacs, Jackquline DenmarkJohn T, MD  lisinopril (ZESTRIL) 10 MG tablet Take 1 tablet (10 mg total) by mouth daily. 07/14/19   Clapacs, Jackquline DenmarkJohn T, MD  risperiDONE (RISPERDAL) 2 MG tablet Take  1 tablet (2 mg total) by mouth at bedtime. 07/14/19   Clapacs, Jackquline Denmark, MD  thiamine (VITAMIN B-1) 100 MG tablet Take 100 mg by mouth daily.    [provider]  vitamin B-12 (CYANOCOBALAMIN) 1000 MCG tablet Take 1 tablet (1,000 mcg total) by mouth daily. 07/14/19   Clapacs, Jackquline Denmark, MD  vitamin B-6 (PYRIDOXINE) 25 MG tablet Take 25 mg by mouth daily.    [provider]    Allergies Patient has no known allergies.  Family History   Adopted: Yes    Social History Social History   Tobacco Use  . Smoking status: Never Smoker  . Smokeless tobacco: Never Used  Substance Use Topics  . Alcohol use: Yes    Comment: 3 drinks per week  . Drug use: No    Review of Systems  Constitutional: No fever/chills.  Positive for generalized weakness. Eyes: No visual changes. ENT: No sore throat. Cardiovascular: Denies chest pain. Respiratory: Denies shortness of breath. Gastrointestinal: No abdominal pain.  No nausea, no vomiting.  No diarrhea.  No constipation. Genitourinary: Negative for dysuria. Musculoskeletal: Negative for back pain. Skin: Negative for rash. Neurological: Negative for headaches, focal weakness or numbness.  ____________________________________________   PHYSICAL EXAM:  VITAL SIGNS: ED Triage Vitals  Enc Vitals Group     BP 12/05/19 1027 (!) 92/55     Pulse Rate 12/05/19 1027 (!) 124     Resp 12/05/19 1027 19     Temp 12/05/19 1027 (!) 97.5 F (36.4 C)     Temp Source 12/05/19 1027 Oral     SpO2 12/05/19 1027 98 %     Weight 12/05/19 1026 220 lb (99.8 kg)     Height 12/05/19 1026 5\' 7"  (1.702 m)     Head Circumference --      Peak Flow --      Pain Score 12/05/19 1031 8     Pain Loc --      Pain Edu? --      Excl. in GC? --     Constitutional: Alert and oriented. Eyes: Scleral icterus noted. Head: Atraumatic. Nose: No congestion/rhinnorhea. Mouth/Throat: Mucous membranes are moist. Neck: Normal ROM Cardiovascular: Normal rate, regular rhythm. Grossly normal heart sounds. Respiratory: Normal respiratory effort.  No retractions. Lungs CTAB. Gastrointestinal: Soft and nontender. No distention. Genitourinary: deferred Musculoskeletal: 2+ pitting edema to bilateral lower extremities. Neurologic:  Normal speech and language. No gross focal neurologic deficits are appreciated. Skin: Significant jaundice to skin diffusely. Psychiatric: Mood and affect are normal. Speech and behavior  are normal.  ____________________________________________   LABS (all labs ordered are listed, but only abnormal results are displayed)  Labs Reviewed  BASIC METABOLIC PANEL - Abnormal; Notable for the following components:      Result Value   Sodium 127 (*)    Potassium 5.3 (*)    Chloride 91 (*)    CO2 17 (*)    Glucose, Bld 143 (*)    BUN 80 (*)    Anion gap 19 (*)    All other components within normal limits  CBC - Abnormal; Notable for the following components:   WBC 15.0 (*)    RBC 2.93 (*)    Hemoglobin 10.2 (*)    HCT 31.6 (*)    MCV 107.8 (*)    MCH 34.8 (*)    RDW 18.3 (*)    All other components within normal limits  HEPATIC FUNCTION PANEL - Abnormal; Notable for the following components:  Albumin 2.3 (*)    AST 293 (*)    ALT 63 (*)    Alkaline Phosphatase 176 (*)    Total Bilirubin 28.2 (*)    Bilirubin, Direct 18.8 (*)    Indirect Bilirubin 9.4 (*)    All other components within normal limits  PROTIME-INR - Abnormal; Notable for the following components:   Prothrombin Time 17.1 (*)    INR 1.4 (*)    All other components within normal limits  AMMONIA - Abnormal; Notable for the following components:   Ammonia 39 (*)    All other components within normal limits  SARS CORONAVIRUS 2 (TAT 6-24 HRS)  URINALYSIS, COMPLETE (UACMP) WITH MICROSCOPIC  ACETAMINOPHEN LEVEL  ETHANOL  SALICYLATE LEVEL  URINE DRUG SCREEN, QUALITATIVE ([facility] ONLY)  CBG MONITORING, ED    PROCEDURES  Procedure(s) performed (including Critical Care):  .Critical Care Performed by: Chesley Noon, MD Authorized by: Chesley Noon, MD   Critical care provider statement:    Critical care time (minutes):  45   Critical care time was exclusive of:  Separately billable procedures and treating other patients and teaching time   Critical care was necessary to treat or prevent imminent or life-threatening deterioration of the following conditions:  Hepatic failure   Critical care was  time spent personally by me on the following activities:  Discussions with consultants, evaluation of patient's response to treatment, examination of patient, ordering and performing treatments and interventions, ordering and review of laboratory studies, ordering and review of radiographic studies, pulse oximetry, re-evaluation of patient's condition, obtaining history from patient or surrogate and review of old charts   I assumed direction of critical care for this patient from another provider in my specialty: no       ____________________________________________   INITIAL IMPRESSION / ASSESSMENT AND PLAN / ED COURSE       48 year old female presenting to the ED with 1 week of gradually worsening lower extremity weakness, now stating that it is more difficult for her to walk and her legs have appeared swollen.  She is obviously jaundiced on exam with bilateral scleral icterus, but she has no abdominal tenderness or vomiting.  Labs are significant for both indirect and direct bilirubinemia with T bili greater than 28.  She has a mild transaminitis with predominance of AST, does admit to drinking a bottle of wine daily in the past, stopping about 1 week ago.  She also admits to taking a significant amount of Tylenol on a regular basis, reports 1000 mg tablets 4 times a day for most days out of the past month.  This seems likely to be contributing to her liver failure, will give empiric dose of IV N-acetylcysteine, Tylenol level pending.  We will also check right upper quadrant ultrasound, although lower suspicion for obstructive process given her relatively normal alkaline phosphatase.  Case discussed with Dr. Tobi Bastos of GI, who agrees with plan to contact liver transplant center at [facility].  Right upper quadrant ultrasound confirms no acute obstructive pathology.  Patient turned over to Dr. Erma Heritage pending further discussion with liver transplant team at [facility].  Patient is receiving acetylcysteine now and  remains in stable condition.      ____________________________________________   FINAL CLINICAL IMPRESSION(S) / ED DIAGNOSES  Final diagnoses:  Elevated bilirubin  Acute liver failure without hepatic coma  Accidental acetaminophen overdose, initial encounter     ED Discharge Orders    None       Note:  This document was prepared  using Systems analyst and may include unintentional dictation errors.   Blake Divine, MD 12/05/19 [phone]

## 2019-12-05 NOTE — ED Notes (Signed)
NT to document vitals soon. Pt on dinamap continuously.

## 2019-12-05 NOTE — ED Notes (Addendum)
I&O cath completed. Urine sample sent to lab. Urine dark brown and very cloudy. Red/pink spots noted to pt's sacrum, hips and inner thighs. Pt had power in place of skin rolls.

## 2019-12-05 NOTE — ED Provider Notes (Signed)
Assumed care from Dr. Charna Archer at 3 PM. Briefly, the patient is a 48 y.o. female with PMHx of  has a past medical history of Bipolar disorder ([place]), Depression, Genital herpes, Obesity, and Weakness. here with abdominal distension, weakness.   Labs Reviewed  BASIC METABOLIC PANEL - Abnormal; Notable for the following components:      Result Value   Sodium 127 (*)    Potassium 5.3 (*)    Chloride 91 (*)    CO2 17 (*)    Glucose, Bld 143 (*)    BUN 80 (*)    Anion gap 19 (*)    All other components within normal limits  CBC - Abnormal; Notable for the following components:   WBC 15.0 (*)    RBC 2.93 (*)    Hemoglobin 10.2 (*)    HCT 31.6 (*)    MCV 107.8 (*)    MCH 34.8 (*)    RDW 18.3 (*)    All other components within normal limits  HEPATIC FUNCTION PANEL - Abnormal; Notable for the following components:   Albumin 2.3 (*)    AST 293 (*)    ALT 63 (*)    Alkaline Phosphatase 176 (*)    Total Bilirubin 28.2 (*)    Bilirubin, Direct 18.8 (*)    Indirect Bilirubin 9.4 (*)    All other components within normal limits  PROTIME-INR - Abnormal; Notable for the following components:   Prothrombin Time 17.1 (*)    INR 1.4 (*)    All other components within normal limits  AMMONIA - Abnormal; Notable for the following components:   Ammonia 39 (*)    All other components within normal limits  ACETAMINOPHEN LEVEL - Abnormal; Notable for the following components:   Acetaminophen (Tylenol), Serum <10 (*)    All other components within normal limits  SARS CORONAVIRUS 2 (TAT 6-24 HRS)  ETHANOL  SALICYLATE LEVEL  URINALYSIS, COMPLETE (UACMP) WITH MICROSCOPIC  URINE DRUG SCREEN, QUALITATIVE ([facility] ONLY)  CBG MONITORING, ED    Course of Care: -Discussed with [facility]ist Dr. Jeraldine Loots, who has accepted pt. mIVF started, NAC running. No other acute tx indicated per [facility]. Pt updated and is aware. She remains fatigued but awake, alert. Admit to [facility] for acute liver failure likely 2/2 EtOH  abuse with APAP overuse.    Duffy Bruce, MD 12/06/19 [phone]

## 2019-12-05 NOTE — ED Notes (Signed)
Pt resting calmly in bed. Alert. Denies any current needs.

## 2019-12-05 NOTE — ED Notes (Signed)
Attempted for 22g IV at R fa. Will have 2nd RN try. Pt hard stick. Will place IV team consult as needed.

## 2019-12-05 NOTE — ED Notes (Signed)
Pt had clay colored BM in briefs. Peri care provided. Briefs and linens changed.

## 2019-12-05 NOTE — ED Notes (Addendum)
Pt back to bedside. Will have Alfredo Martinez or another RN attempt for IV with [place] as IV team not yet to bedside. [place] notified.

## 2019-12-05 NOTE — ED Notes (Signed)
Pt resting in bed. Bed locked low. Rails up. Pt denies any needs.

## 2019-12-05 NOTE — ED Notes (Signed)
Pt A&Ox4 but talking slowly; extremely fatigued; weak; legs swollen more than normal per pt; abd distended but not taut; abd uncomfortable in general per pt but not particularly sensitive to touch. Pt denies major liver history but then reported her doctor once told her to watch her liver function closely. Pt reports typically drinking a bottle of wine a day but states stopped last week. White of pt's eyes jaundiced as well as pt's skin. Cardiac: s1/s2 noted; no adventitious sounds noted.

## 2019-12-05 NOTE — ED Notes (Signed)
Pharm called this RN to notify pharm currently out of acetylcysteine mix. Stated contacted [facility] and transport is on way with next dose. Pharm states once they receive this they will mix it STAT and send to ED. Will hang once received. Current bag has about 100cc left.

## 2019-12-05 NOTE — ED Notes (Signed)
Called lab. Per Levada Dy in lab, pt still needs blue/dark grn/red tubes sent. Will place IV and send soon.

## 2019-12-06 LAB — URINALYSIS, COMPLETE (UACMP) WITH MICROSCOPIC
Bacteria, UA: NONE SEEN
Glucose, UA: 50 mg/dL — AB
Ketones, ur: NEGATIVE mg/dL
Leukocytes,Ua: NEGATIVE
Nitrite: NEGATIVE
Protein, ur: 30 mg/dL — AB
Specific Gravity, Urine: 1.019 (ref 1.005–1.030)
Squamous Epithelial / LPF: NONE SEEN (ref 0–5)
pH: 5 (ref 5.0–8.0)

## 2019-12-06 LAB — SARS CORONAVIRUS 2 (TAT 6-24 HRS): SARS Coronavirus 2: NEGATIVE

## 2019-12-06 MED ORDER — SODIUM CHLORIDE 0.9 % IV BOLUS
500.0000 mL | Freq: Once | INTRAVENOUS | Status: AC
  Administered 2019-12-06: 500 mL via INTRAVENOUS

## 2019-12-06 NOTE — ED Provider Notes (Signed)
-----------------------------------------   6:21 AM on 12/06/2019 -----------------------------------------  No events overnight.  Patient remains on the [facility] wait list hopefully to be transported later this morning.   Paulette Blanch, MD 12/06/19 [phone]

## 2019-12-06 NOTE — ED Notes (Signed)
This RN to bedside, pt given phone to speak with Nicole Kindred, family member. This RN explained spoke with [facility] who states they are still waiting on a bed at [facility]. Pt A&O x 4, states understanding. Will continue to monitor for further patient needs.

## 2019-12-06 NOTE — ED Notes (Signed)
Pt signed paper copy of consent to transfer

## 2019-12-06 NOTE — ED Notes (Signed)
Spoke with Dian Situ from [facility] to give report on patient and organize transport to [facility].

## 2019-12-06 NOTE — ED Notes (Signed)
Pt's purewick replaced by this RN. Pt given ice chips per her request. Pt denies further needs at this time. Will continue to monitor for further patient needs.

## 2019-12-06 NOTE — ED Notes (Signed)
EDP notified of patients BP 87/58. VORB for 500cc bolus. Initiated by this Therapist, sports. Pt c/o increasing weakness. Pt asking this RN regarding why her Purewick not changed yesterday. This RN explained could not speak to decisions made by previous RN's. Pt states understanding. This RN offered to change pt's purewick at this time, pt accepts, will change pt's purewick at this time.

## 2019-12-06 NOTE — ED Notes (Signed)
Pt brief changed at this time and given ice chips

## 2019-12-06 NOTE — ED Notes (Signed)
Report given to [place] at [facility].

## 2019-12-06 NOTE — ED Notes (Signed)
EDP made aware pt's BP improved after 500cc bolus.

## 2019-12-06 NOTE — ED Notes (Signed)
This RN to bedside at this time. Pt resting in bed, c/o feeling continued generalized weakness at this time. Pt given more ice chips per her request. Denies any further needs. Will continue to monitor for further patient needs.

## 2019-12-06 NOTE — ED Notes (Signed)
Pt gives verbal consent for transfer 

## 2019-12-30 MED ORDER — HYDROMORPHONE HCL 1 MG/ML IJ SOLN
0.25 | INTRAMUSCULAR | Status: DC
Start: ? — End: 2019-12-30

## 2019-12-30 MED ORDER — LIDOCAINE HCL 1 % IJ SOLN
0.50 | INTRAMUSCULAR | Status: DC
Start: ? — End: 2019-12-30

## 2019-12-30 MED ORDER — LORAZEPAM 2 MG/ML IJ SOLN
0.25 | INTRAMUSCULAR | Status: DC
Start: ? — End: 2019-12-30

## 2019-12-30 MED ORDER — HYDROXYZINE HCL 10 MG PO TABS
10.00 | ORAL_TABLET | ORAL | Status: DC
Start: ? — End: 2019-12-30

## 2019-12-30 MED ORDER — ONDANSETRON HCL 4 MG/2ML IJ SOLN
4.00 | INTRAMUSCULAR | Status: DC
Start: ? — End: 2019-12-30

## 2019-12-30 MED ORDER — NYSTATIN 100000 UNIT/GM EX POWD
CUTANEOUS | Status: DC
Start: 2019-12-30 — End: 2019-12-30

## 2019-12-30 MED ORDER — CALCIUM CARBONATE ANTACID 750 MG PO CHEW
CHEWABLE_TABLET | ORAL | Status: DC
Start: ? — End: 2019-12-30

## 2019-12-30 MED ORDER — TRAZODONE HCL 50 MG PO TABS
25.00 | ORAL_TABLET | ORAL | Status: DC
Start: ? — End: 2019-12-30

## 2019-12-30 MED ORDER — GENERIC EXTERNAL MEDICATION
Status: DC
Start: ? — End: 2019-12-30

## 2019-12-30 MED ORDER — MELATONIN 3 MG PO TABS
6.00 | ORAL_TABLET | ORAL | Status: DC
Start: 2019-12-30 — End: 2019-12-30

## 2019-12-30 MED ORDER — BENZONATATE 100 MG PO CAPS
100.00 | ORAL_CAPSULE | ORAL | Status: DC
Start: ? — End: 2019-12-30

## 2019-12-30 MED ORDER — SIMETHICONE 80 MG PO CHEW
80.00 | CHEWABLE_TABLET | ORAL | Status: DC
Start: 2019-12-30 — End: 2019-12-30

## 2020-01-30 DEATH — deceased

## 2021-05-23 IMAGING — US US ABDOMEN LIMITED
1 series · 14 of 25 positions shown · non-contrast
Comparison: None.

CLINICAL DATA: Elevated bilirubin.

EXAM:
ULTRASOUND ABDOMEN LIMITED RIGHT UPPER QUADRANT

[Series 1: us abdomen limited · 0.26mm/px · 14 of 33 slices shown]
[im 1/33]
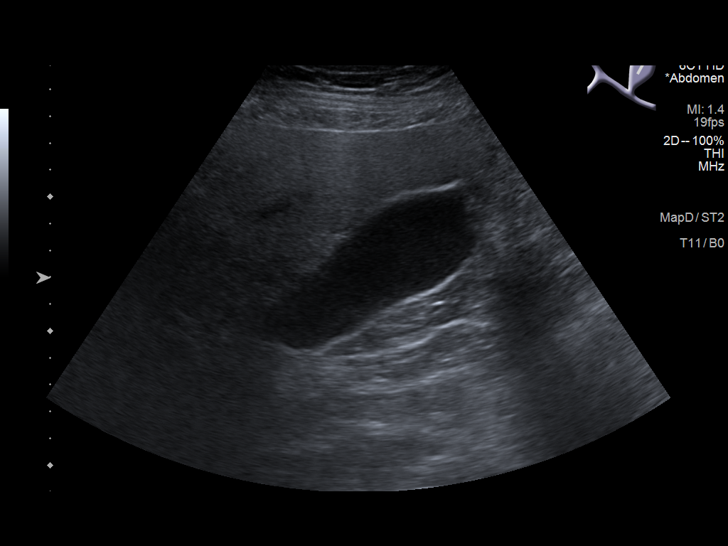
[im 3/33]
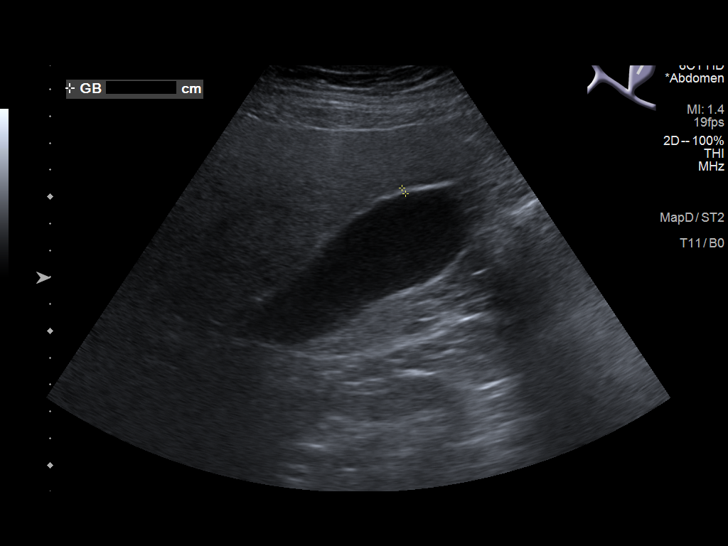
[im 6/33]
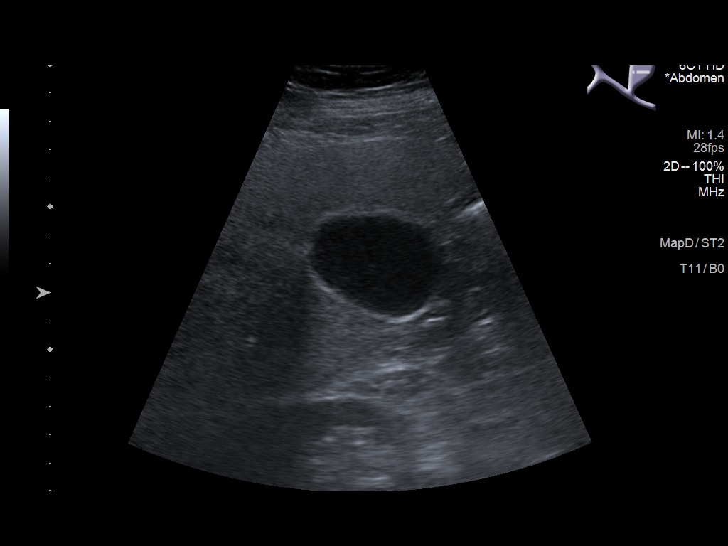
[im 9/33]
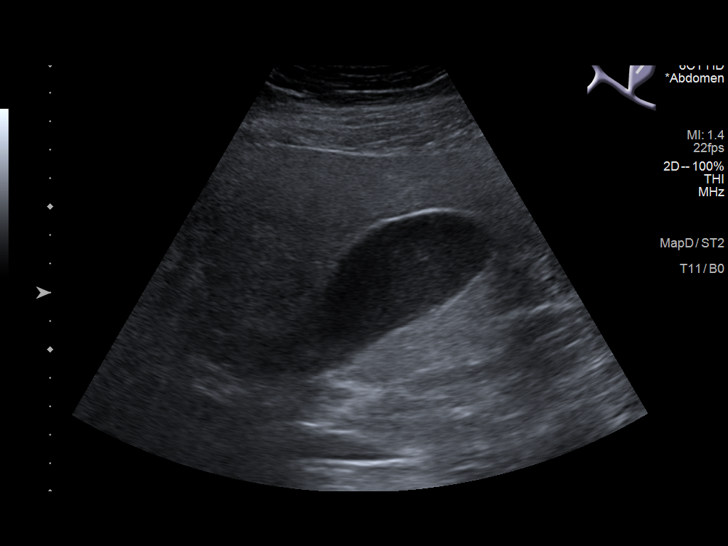
[im 11/33]
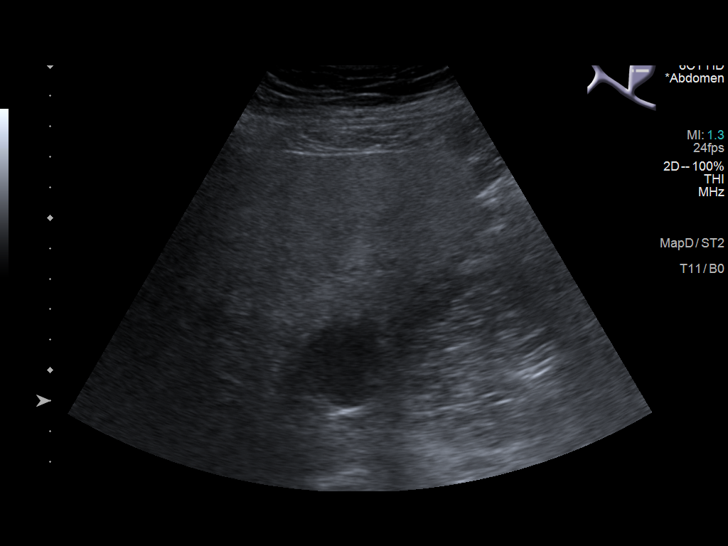
[im 13/33]
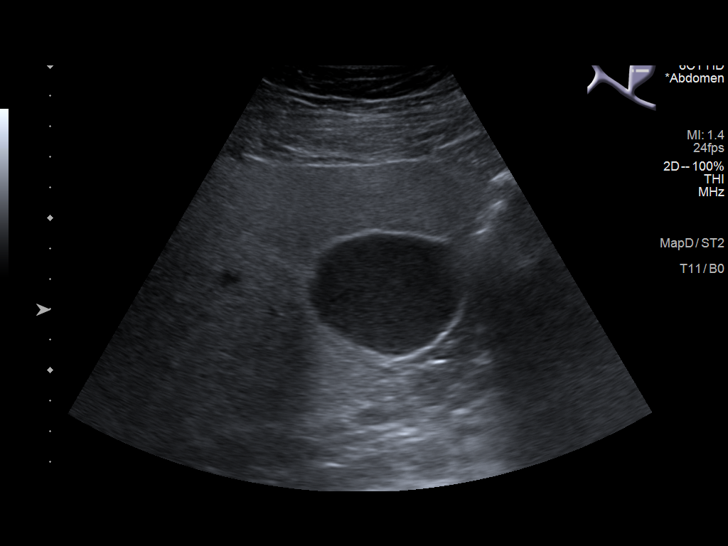
[im 15/33]
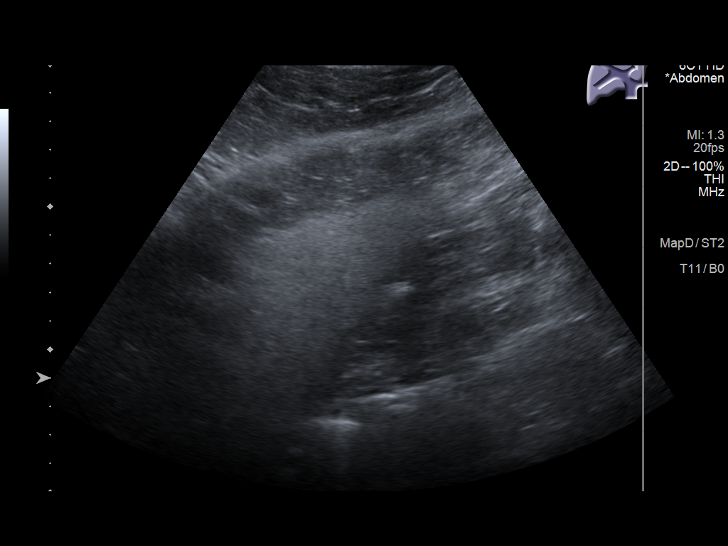
[im 18/33]
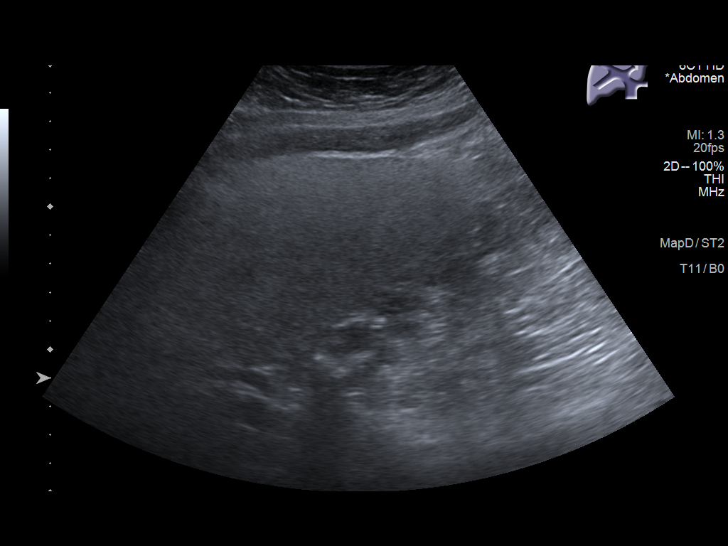
[im 21/33]
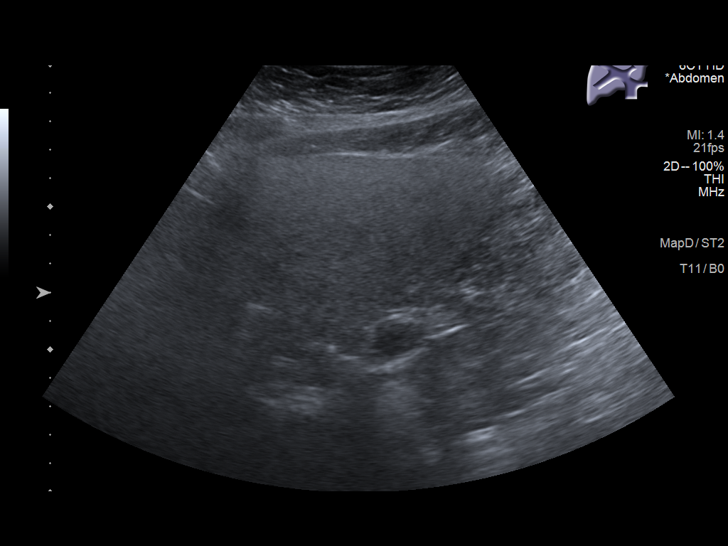
[im 22/33]
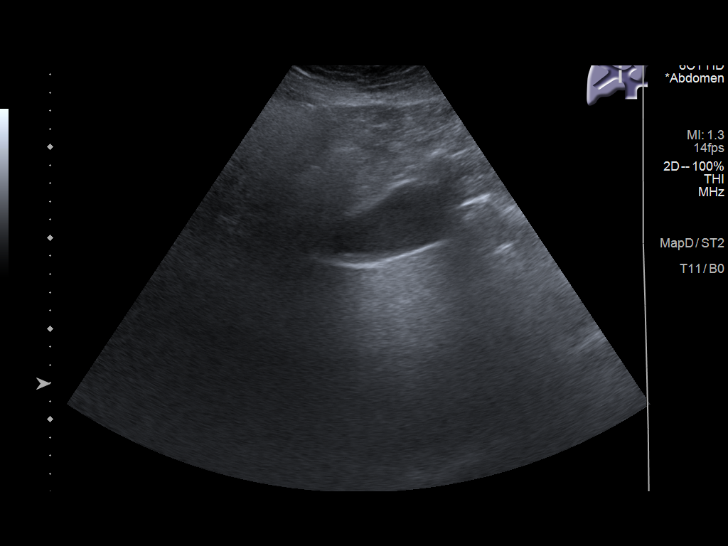
[im 25/33]
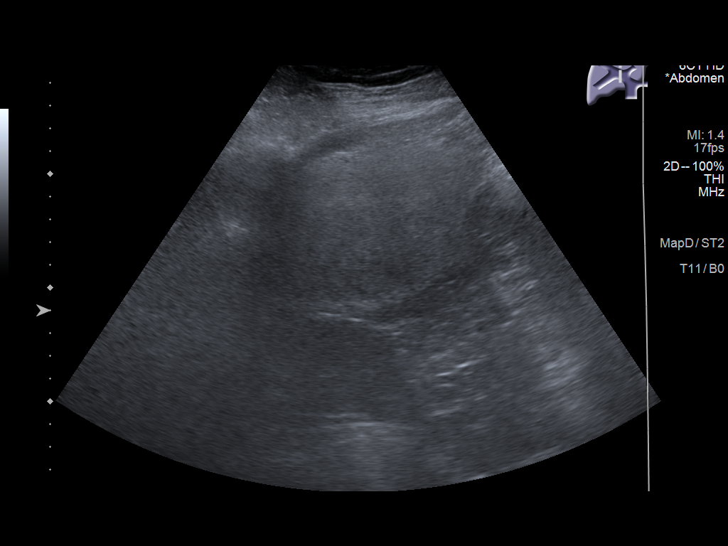
[im 27/33]
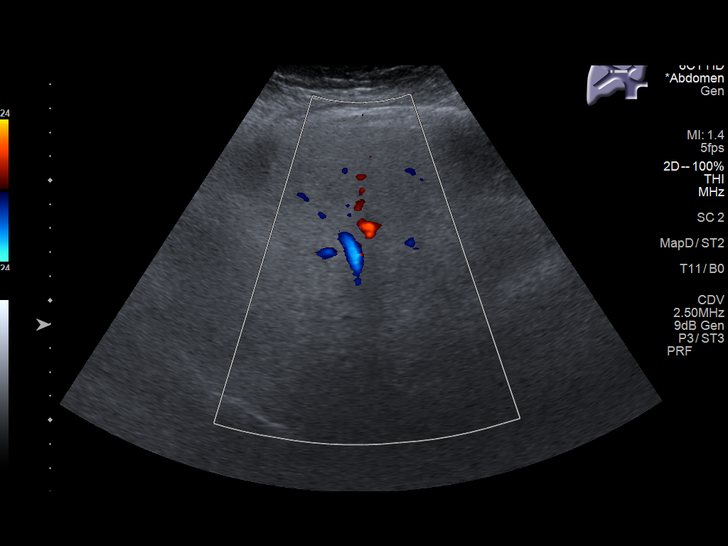
[im 30/33]
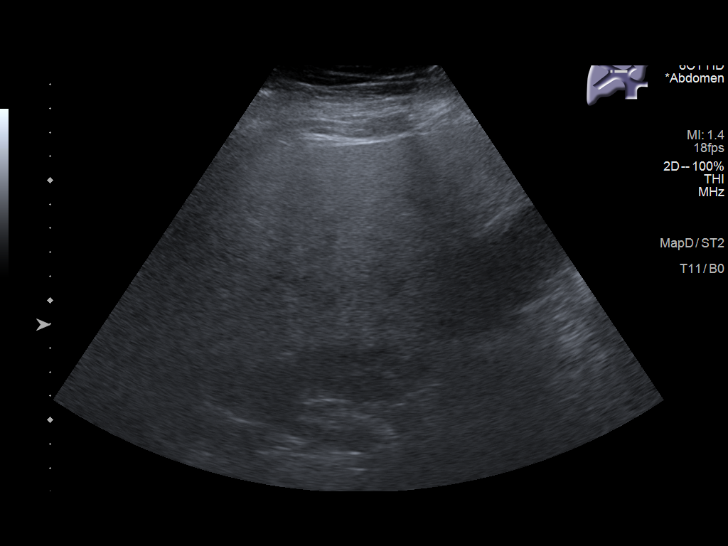
[im 33/33]
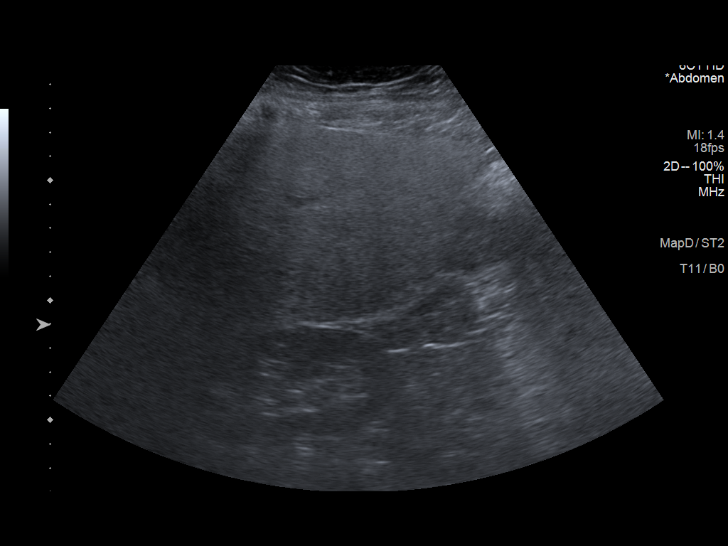

[14 of 25 positions shown; findings below may reference images not displayed]

FINDINGS: Gallbladder:

Small amount of echogenic sludge is noted in the gallbladder but no
gallstones, wall thickening or pericholecystic fluid to suggest
acute cholecystitis.

Common bile duct:

Diameter: 4.5 mm

Liver:

There is diffuse increased echogenicity of the liver and decreased
through transmission consistent with fatty infiltration. No focal
lesions or biliary dilatation. Portal vein is patent on color
Doppler imaging with normal direction of blood flow towards the
liver.

Other: None.
IMPRESSION: 1. Diffuse and fairly marked fatty infiltration of the liver with
poor definition of the liver architecture and poor through
transmission. No obvious hepatic lesions or intrahepatic biliary
dilatation.
2. Gallbladder sludge but no gallstones or findings for acute
cholecystitis.
3. No intra or extrahepatic biliary dilatation.
